# Patient Record
Sex: Female | Born: 1935 | Race: White | Hispanic: No | Smoking: Former smoker
Health system: Southern US, Community
[De-identification: ages and names within clinical notes are randomized; demographics above are authoritative.]

## PROBLEM LIST (undated history)

## (undated) DIAGNOSIS — E785 Hyperlipidemia, unspecified: Secondary | ICD-10-CM

## (undated) DIAGNOSIS — J45909 Unspecified asthma, uncomplicated: Secondary | ICD-10-CM

## (undated) DIAGNOSIS — I82409 Acute embolism and thrombosis of unspecified deep veins of unspecified lower extremity: Secondary | ICD-10-CM

## (undated) DIAGNOSIS — I10 Essential (primary) hypertension: Secondary | ICD-10-CM

## (undated) DIAGNOSIS — E119 Type 2 diabetes mellitus without complications: Secondary | ICD-10-CM

## (undated) DIAGNOSIS — C801 Malignant (primary) neoplasm, unspecified: Secondary | ICD-10-CM

## (undated) DIAGNOSIS — E0590 Thyrotoxicosis, unspecified without thyrotoxic crisis or storm: Secondary | ICD-10-CM

## (undated) DIAGNOSIS — K449 Diaphragmatic hernia without obstruction or gangrene: Secondary | ICD-10-CM

## (undated) HISTORY — PX: TOTAL HIP ARTHROPLASTY: SHX124

## (undated) HISTORY — PX: BREAST SURGERY: SHX581

## (undated) HISTORY — PX: ABDOMINAL HYSTERECTOMY: SHX81

---

## 2004-12-31 ENCOUNTER — Ambulatory Visit: Payer: Self-pay | Admitting: Internal Medicine

## 2005-03-17 ENCOUNTER — Inpatient Hospital Stay: Payer: Self-pay | Admitting: Internal Medicine

## 2006-01-03 ENCOUNTER — Ambulatory Visit: Payer: Self-pay | Admitting: Internal Medicine

## 2007-01-08 ENCOUNTER — Ambulatory Visit: Payer: Self-pay | Admitting: Internal Medicine

## 2007-04-13 ENCOUNTER — Ambulatory Visit: Payer: Self-pay | Admitting: Unknown Physician Specialty

## 2008-01-09 ENCOUNTER — Ambulatory Visit: Payer: Self-pay | Admitting: Internal Medicine

## 2008-12-24 ENCOUNTER — Ambulatory Visit: Payer: Self-pay | Admitting: Internal Medicine

## 2008-12-30 ENCOUNTER — Ambulatory Visit: Payer: Self-pay | Admitting: Internal Medicine

## 2009-01-27 ENCOUNTER — Ambulatory Visit: Payer: Self-pay | Admitting: Surgery

## 2010-03-04 ENCOUNTER — Ambulatory Visit: Payer: Self-pay | Admitting: Internal Medicine

## 2010-03-17 ENCOUNTER — Ambulatory Visit: Payer: Self-pay | Admitting: Internal Medicine

## 2010-09-07 ENCOUNTER — Ambulatory Visit: Payer: Self-pay | Admitting: Surgery

## 2010-10-04 ENCOUNTER — Ambulatory Visit: Payer: Self-pay | Admitting: Surgery

## 2010-10-08 ENCOUNTER — Ambulatory Visit: Payer: Self-pay | Admitting: Surgery

## 2010-10-19 LAB — PATHOLOGY REPORT

## 2010-10-22 ENCOUNTER — Ambulatory Visit: Payer: Self-pay | Admitting: Surgery

## 2010-10-27 LAB — PATHOLOGY REPORT

## 2010-11-10 ENCOUNTER — Ambulatory Visit: Payer: Self-pay | Admitting: Oncology

## 2010-11-10 ENCOUNTER — Ambulatory Visit: Payer: Self-pay | Admitting: Internal Medicine

## 2010-11-21 ENCOUNTER — Ambulatory Visit: Payer: Self-pay | Admitting: Internal Medicine

## 2010-11-21 ENCOUNTER — Ambulatory Visit: Payer: Self-pay | Admitting: Oncology

## 2010-12-22 ENCOUNTER — Ambulatory Visit: Payer: Self-pay | Admitting: Internal Medicine

## 2010-12-22 ENCOUNTER — Ambulatory Visit: Payer: Self-pay | Admitting: Oncology

## 2011-01-22 ENCOUNTER — Ambulatory Visit: Payer: Self-pay | Admitting: Oncology

## 2011-01-22 ENCOUNTER — Ambulatory Visit: Payer: Self-pay | Admitting: Internal Medicine

## 2011-02-21 ENCOUNTER — Ambulatory Visit: Payer: Self-pay | Admitting: Internal Medicine

## 2011-02-21 ENCOUNTER — Ambulatory Visit: Payer: Self-pay | Admitting: Oncology

## 2011-03-24 ENCOUNTER — Ambulatory Visit: Payer: Self-pay | Admitting: Oncology

## 2011-04-20 ENCOUNTER — Ambulatory Visit: Payer: Self-pay | Admitting: Internal Medicine

## 2011-05-13 ENCOUNTER — Ambulatory Visit: Payer: Self-pay | Admitting: Internal Medicine

## 2011-05-24 ENCOUNTER — Ambulatory Visit: Payer: Self-pay | Admitting: Internal Medicine

## 2011-07-25 ENCOUNTER — Ambulatory Visit: Payer: Self-pay | Admitting: Internal Medicine

## 2011-08-22 ENCOUNTER — Ambulatory Visit: Payer: Self-pay | Admitting: Internal Medicine

## 2011-11-18 ENCOUNTER — Ambulatory Visit: Payer: Self-pay | Admitting: Internal Medicine

## 2011-11-18 LAB — CREATININE, SERUM
Creatinine: 0.66 mg/dL (ref 0.60–1.30)
EGFR (African American): >60
EGFR (Non-African Amer.): >60

## 2011-11-18 LAB — CBC CANCER CENTER
Basophil #: 0 x10 3/mm (ref 0.0–0.1)
Eosinophil #: 0.1 x10 3/mm (ref 0.0–0.7)
Eosinophil %: 2.6 %
Lymphocyte #: 1.6 x10 3/mm (ref 1.0–3.6)
Lymphocyte %: 27.6 %
MCH: 27.3 pg (ref 26.0–34.0)
MCHC: 32.2 g/dL (ref 32.0–36.0)
Monocyte #: 0.5 x10 3/mm (ref 0.2–0.9)
Monocyte %: 7.7 %
Neutrophil #: 3.6 x10 3/mm (ref 1.4–6.5)
Neutrophil %: 61.8 %
Platelet: 172 x10 3/mm (ref 150–440)
RBC: 4.58 10*6/uL (ref 3.80–5.20)
WBC: 5.9 x10 3/mm (ref 3.6–11.0)

## 2011-11-18 LAB — HEPATIC FUNCTION PANEL A (ARMC)
Alkaline Phosphatase: 58 U/L (ref 50–136)
Bilirubin, Direct: 0.1 mg/dL (ref 0.00–0.20)
SGPT (ALT): 33 U/L
Total Protein: 7.9 g/dL (ref 6.4–8.2)

## 2011-11-21 ENCOUNTER — Ambulatory Visit: Payer: Self-pay | Admitting: Internal Medicine

## 2012-02-01 ENCOUNTER — Ambulatory Visit: Payer: Self-pay | Admitting: Radiation Oncology

## 2012-02-21 ENCOUNTER — Ambulatory Visit: Payer: Self-pay | Admitting: Radiation Oncology

## 2012-03-23 ENCOUNTER — Ambulatory Visit: Payer: Self-pay | Admitting: Radiation Oncology

## 2012-04-22 ENCOUNTER — Ambulatory Visit: Payer: Self-pay | Admitting: Radiation Oncology

## 2012-04-25 ENCOUNTER — Ambulatory Visit: Payer: Self-pay | Admitting: Internal Medicine

## 2012-05-25 ENCOUNTER — Ambulatory Visit: Payer: Self-pay | Admitting: Internal Medicine

## 2012-05-25 LAB — CBC CANCER CENTER
Basophil %: 0.3 %
Eosinophil #: 0.1 x10 3/mm (ref 0.0–0.7)
Lymphocyte %: 34 %
MCHC: 33.1 g/dL (ref 32.0–36.0)
MCV: 84 fL (ref 80–100)
Monocyte #: 0.6 x10 3/mm (ref 0.2–0.9)
Monocyte %: 8.2 %
Neutrophil #: 3.9 x10 3/mm (ref 1.4–6.5)
Neutrophil %: 55.4 %
Platelet: 179 x10 3/mm (ref 150–440)
RBC: 4.68 10*6/uL (ref 3.80–5.20)

## 2012-05-25 LAB — CREATININE, SERUM
Creatinine: 0.78 mg/dL (ref 0.60–1.30)
EGFR (Non-African Amer.): >60

## 2012-05-25 LAB — HEPATIC FUNCTION PANEL A (ARMC)
Albumin: 4.1 g/dL (ref 3.4–5.0)
Bilirubin, Direct: <0.05 mg/dL (ref 0.00–0.20)
Bilirubin,Total: 0.3 mg/dL (ref 0.2–1.0)
SGOT(AST): 23 U/L (ref 15–37)
SGPT (ALT): 35 U/L (ref 12–78)

## 2012-06-23 ENCOUNTER — Ambulatory Visit: Payer: Self-pay | Admitting: Internal Medicine

## 2012-08-01 ENCOUNTER — Ambulatory Visit: Payer: Self-pay | Admitting: Radiation Oncology

## 2012-08-21 ENCOUNTER — Ambulatory Visit: Payer: Self-pay | Admitting: Radiation Oncology

## 2012-10-24 ENCOUNTER — Ambulatory Visit: Payer: Self-pay | Admitting: Internal Medicine

## 2012-10-24 LAB — HEPATIC FUNCTION PANEL A (ARMC)
Bilirubin, Direct: 0.1 mg/dL (ref 0.00–0.20)
SGPT (ALT): 34 U/L (ref 12–78)
Total Protein: 7.6 g/dL (ref 6.4–8.2)

## 2012-10-24 LAB — CBC CANCER CENTER
Basophil #: 0 x10 3/mm (ref 0.0–0.1)
Basophil %: 0.3 %
Eosinophil #: 0.2 x10 3/mm (ref 0.0–0.7)
HCT: 37.3 % (ref 35.0–47.0)
HGB: 12.6 g/dL (ref 12.0–16.0)
Lymphocyte #: 2.1 x10 3/mm (ref 1.0–3.6)
MCH: 28.4 pg (ref 26.0–34.0)
MCHC: 33.9 g/dL (ref 32.0–36.0)
MCV: 84 fL (ref 80–100)
Monocyte #: 0.4 x10 3/mm (ref 0.2–0.9)
Monocyte %: 6.4 %
Neutrophil #: 4.2 x10 3/mm (ref 1.4–6.5)
Neutrophil %: 60.5 %
Platelet: 185 x10 3/mm (ref 150–440)
RBC: 4.46 10*6/uL (ref 3.80–5.20)

## 2012-10-24 LAB — CREATININE, SERUM
Creatinine: 0.69 mg/dL (ref 0.60–1.30)
EGFR (African American): >60
EGFR (Non-African Amer.): >60

## 2012-11-19 ENCOUNTER — Ambulatory Visit: Payer: Self-pay | Admitting: Internal Medicine

## 2012-11-20 ENCOUNTER — Ambulatory Visit: Payer: Self-pay | Admitting: Internal Medicine

## 2013-06-11 ENCOUNTER — Ambulatory Visit: Payer: Self-pay | Admitting: Internal Medicine

## 2013-12-26 ENCOUNTER — Emergency Department: Payer: Self-pay | Admitting: Emergency Medicine

## 2014-03-10 ENCOUNTER — Inpatient Hospital Stay: Payer: Self-pay | Admitting: Vascular Surgery

## 2014-03-10 LAB — BASIC METABOLIC PANEL
Anion Gap: 9 (ref 7–16)
BUN: 8 mg/dL (ref 7–18)
CO2: 28 mmol/L (ref 21–32)
CREATININE: 0.46 mg/dL — AB (ref 0.60–1.30)
Calcium, Total: 8.7 mg/dL (ref 8.5–10.1)
Chloride: 105 mmol/L (ref 98–107)
EGFR (Non-African Amer.): >60
Glucose: 88 mg/dL (ref 65–99)
Osmolality: 281 (ref 275–301)
Potassium: 3.4 mmol/L — ABNORMAL LOW (ref 3.5–5.1)
Sodium: 142 mmol/L (ref 136–145)

## 2014-03-10 LAB — CBC WITH DIFFERENTIAL/PLATELET
BASOS PCT: 0.3 %
Basophil #: 0 10*3/uL (ref 0.0–0.1)
Eosinophil #: 0.1 10*3/uL (ref 0.0–0.7)
Eosinophil %: 1.3 %
HCT: 37.2 % (ref 35.0–47.0)
HGB: 11.7 g/dL — AB (ref 12.0–16.0)
LYMPHS ABS: 2.1 10*3/uL (ref 1.0–3.6)
LYMPHS PCT: 23.5 %
MCH: 26 pg (ref 26.0–34.0)
MCHC: 31.5 g/dL — ABNORMAL LOW (ref 32.0–36.0)
MCV: 83 fL (ref 80–100)
MONO ABS: 0.6 x10 3/mm (ref 0.2–0.9)
Monocyte %: 6.9 %
NEUTROS ABS: 6 10*3/uL (ref 1.4–6.5)
Neutrophil %: 68 %
PLATELETS: 242 10*3/uL (ref 150–440)
RBC: 4.51 10*6/uL (ref 3.80–5.20)
RDW: 12.9 % (ref 11.5–14.5)
WBC: 8.8 10*3/uL (ref 3.6–11.0)

## 2014-03-11 LAB — URINALYSIS, COMPLETE
BACTERIA: NONE SEEN
BLOOD: NEGATIVE
Bilirubin,UR: NEGATIVE
GLUCOSE, UR: NEGATIVE mg/dL (ref 0–75)
KETONE: NEGATIVE
Nitrite: NEGATIVE
Ph: 5 (ref 4.5–8.0)
Protein: NEGATIVE
RBC,UR: 1 /HPF (ref 0–5)
Specific Gravity: 1.008 (ref 1.003–1.030)
Squamous Epithelial: 1
WBC UR: 18 /HPF (ref 0–5)

## 2014-03-12 LAB — CBC WITH DIFFERENTIAL/PLATELET
BASOS ABS: 0 10*3/uL (ref 0.0–0.1)
Basophil %: 0.3 %
EOS PCT: 1 %
Eosinophil #: 0.1 10*3/uL (ref 0.0–0.7)
HCT: 37.6 % (ref 35.0–47.0)
HGB: 11.8 g/dL — ABNORMAL LOW (ref 12.0–16.0)
Lymphocyte #: 1.2 10*3/uL (ref 1.0–3.6)
Lymphocyte %: 14.7 %
MCH: 25.8 pg — AB (ref 26.0–34.0)
MCHC: 31.4 g/dL — ABNORMAL LOW (ref 32.0–36.0)
MCV: 82 fL (ref 80–100)
MONOS PCT: 5.1 %
Monocyte #: 0.4 x10 3/mm (ref 0.2–0.9)
Neutrophil #: 6.6 10*3/uL — ABNORMAL HIGH (ref 1.4–6.5)
Neutrophil %: 78.9 %
Platelet: 233 10*3/uL (ref 150–440)
RBC: 4.56 10*6/uL (ref 3.80–5.20)
RDW: 12.9 % (ref 11.5–14.5)
WBC: 8.4 10*3/uL (ref 3.6–11.0)

## 2014-03-12 LAB — BASIC METABOLIC PANEL
ANION GAP: 11 (ref 7–16)
BUN: 6 mg/dL — ABNORMAL LOW (ref 7–18)
CALCIUM: 8.5 mg/dL (ref 8.5–10.1)
Chloride: 101 mmol/L (ref 98–107)
Co2: 30 mmol/L (ref 21–32)
Creatinine: 0.54 mg/dL — ABNORMAL LOW (ref 0.60–1.30)
GLUCOSE: 115 mg/dL — AB (ref 65–99)
Osmolality: 282 (ref 275–301)
POTASSIUM: 2.9 mmol/L — AB (ref 3.5–5.1)
Sodium: 142 mmol/L (ref 136–145)

## 2014-03-13 LAB — CBC WITH DIFFERENTIAL/PLATELET
BASOS ABS: 0 10*3/uL (ref 0.0–0.1)
Basophil %: 0.4 %
EOS ABS: 0.3 10*3/uL (ref 0.0–0.7)
Eosinophil %: 4.5 %
HCT: 39.2 % (ref 35.0–47.0)
HGB: 12.2 g/dL (ref 12.0–16.0)
Lymphocyte #: 1.9 10*3/uL (ref 1.0–3.6)
Lymphocyte %: 24 %
MCH: 25.8 pg — AB (ref 26.0–34.0)
MCHC: 31.1 g/dL — AB (ref 32.0–36.0)
MCV: 83 fL (ref 80–100)
MONO ABS: 0.6 x10 3/mm (ref 0.2–0.9)
Monocyte %: 7.4 %
NEUTROS PCT: 63.7 %
Neutrophil #: 5 10*3/uL (ref 1.4–6.5)
PLATELETS: 229 10*3/uL (ref 150–440)
RBC: 4.71 10*6/uL (ref 3.80–5.20)
RDW: 13 % (ref 11.5–14.5)
WBC: 7.8 10*3/uL (ref 3.6–11.0)

## 2014-03-13 LAB — BASIC METABOLIC PANEL
ANION GAP: 8 (ref 7–16)
BUN: 7 mg/dL (ref 7–18)
CHLORIDE: 101 mmol/L (ref 98–107)
CO2: 31 mmol/L (ref 21–32)
Calcium, Total: 8.9 mg/dL (ref 8.5–10.1)
Creatinine: 0.62 mg/dL (ref 0.60–1.30)
EGFR (African American): >60
EGFR (Non-African Amer.): >60
GLUCOSE: 100 mg/dL — AB (ref 65–99)
OSMOLALITY: 277 (ref 275–301)
Potassium: 3.3 mmol/L — ABNORMAL LOW (ref 3.5–5.1)
Sodium: 140 mmol/L (ref 136–145)

## 2014-03-14 LAB — CBC WITH DIFFERENTIAL/PLATELET
BASOS ABS: 0 10*3/uL (ref 0.0–0.1)
Basophil %: 0.2 %
EOS ABS: 0.6 10*3/uL (ref 0.0–0.7)
Eosinophil %: 6.6 %
HCT: 40 % (ref 35.0–47.0)
HGB: 13 g/dL (ref 12.0–16.0)
Lymphocyte #: 2 10*3/uL (ref 1.0–3.6)
Lymphocyte %: 24.4 %
MCH: 26.7 pg (ref 26.0–34.0)
MCHC: 32.5 g/dL (ref 32.0–36.0)
MCV: 82 fL (ref 80–100)
Monocyte #: 0.7 x10 3/mm (ref 0.2–0.9)
Monocyte %: 8.6 %
Neutrophil #: 5 10*3/uL (ref 1.4–6.5)
Neutrophil %: 60.2 %
PLATELETS: 231 10*3/uL (ref 150–440)
RBC: 4.87 10*6/uL (ref 3.80–5.20)
RDW: 13 % (ref 11.5–14.5)
WBC: 8.3 10*3/uL (ref 3.6–11.0)

## 2014-03-14 LAB — BASIC METABOLIC PANEL
Anion Gap: 8 (ref 7–16)
BUN: 11 mg/dL (ref 7–18)
Calcium, Total: 9.2 mg/dL (ref 8.5–10.1)
Chloride: 102 mmol/L (ref 98–107)
Co2: 32 mmol/L (ref 21–32)
Creatinine: 0.68 mg/dL (ref 0.60–1.30)
EGFR (African American): >60
EGFR (Non-African Amer.): >60
Glucose: 108 mg/dL — ABNORMAL HIGH (ref 65–99)
OSMOLALITY: 283 (ref 275–301)
Potassium: 3.9 mmol/L (ref 3.5–5.1)
Sodium: 142 mmol/L (ref 136–145)

## 2014-03-16 LAB — WOUND CULTURE

## 2014-03-17 LAB — WOUND CULTURE

## 2014-06-11 ENCOUNTER — Ambulatory Visit: Payer: Self-pay | Admitting: Orthopedic Surgery

## 2014-06-11 DIAGNOSIS — R030 Elevated blood-pressure reading, without diagnosis of hypertension: Secondary | ICD-10-CM

## 2014-06-11 LAB — CBC
HCT: 39 % (ref 35.0–47.0)
HGB: 12.4 g/dL (ref 12.0–16.0)
MCH: 26.3 pg (ref 26.0–34.0)
MCHC: 31.7 g/dL — ABNORMAL LOW (ref 32.0–36.0)
MCV: 83 fL (ref 80–100)
PLATELETS: 198 10*3/uL (ref 150–440)
RBC: 4.7 10*6/uL (ref 3.80–5.20)
RDW: 14.4 % (ref 11.5–14.5)
WBC: 7.5 10*3/uL (ref 3.6–11.0)

## 2014-06-11 LAB — BASIC METABOLIC PANEL
Anion Gap: 6 — ABNORMAL LOW (ref 7–16)
BUN: 11 mg/dL (ref 7–18)
CALCIUM: 9.3 mg/dL (ref 8.5–10.1)
CREATININE: 0.47 mg/dL — AB (ref 0.60–1.30)
Chloride: 104 mmol/L (ref 98–107)
Co2: 32 mmol/L (ref 21–32)
EGFR (Non-African Amer.): >60
GLUCOSE: 79 mg/dL (ref 65–99)
Osmolality: 281 (ref 275–301)
Potassium: 3.5 mmol/L (ref 3.5–5.1)
Sodium: 142 mmol/L (ref 136–145)

## 2014-06-11 LAB — URINALYSIS, COMPLETE
BILIRUBIN, UR: NEGATIVE
Bacteria: NONE SEEN
Blood: NEGATIVE
Glucose,UR: NEGATIVE mg/dL (ref 0–75)
Ketone: NEGATIVE
LEUKOCYTE ESTERASE: NEGATIVE
Nitrite: NEGATIVE
PH: 7 (ref 4.5–8.0)
PROTEIN: NEGATIVE
RBC, UR: NONE SEEN /HPF (ref 0–5)
Specific Gravity: 1.015 (ref 1.003–1.030)
WBC UR: 1 /HPF (ref 0–5)

## 2014-06-11 LAB — MRSA PCR SCREENING

## 2014-06-11 LAB — SEDIMENTATION RATE: Erythrocyte Sed Rate: 14 mm/hr (ref 0–30)

## 2014-06-11 LAB — PROTIME-INR
INR: 1.1
Prothrombin Time: 13.9 secs (ref 11.5–14.7)

## 2014-06-11 LAB — APTT: ACTIVATED PTT: 31.6 s (ref 23.6–35.9)

## 2014-06-12 LAB — URINE CULTURE

## 2014-06-17 ENCOUNTER — Inpatient Hospital Stay: Payer: Self-pay | Admitting: Orthopedic Surgery

## 2014-06-18 LAB — BASIC METABOLIC PANEL
Anion Gap: 6 — ABNORMAL LOW (ref 7–16)
BUN: 9 mg/dL (ref 7–18)
CREATININE: 0.58 mg/dL — AB (ref 0.60–1.30)
Calcium, Total: 8.1 mg/dL — ABNORMAL LOW (ref 8.5–10.1)
Chloride: 105 mmol/L (ref 98–107)
Co2: 31 mmol/L (ref 21–32)
GLUCOSE: 111 mg/dL — AB (ref 65–99)
Osmolality: 283 (ref 275–301)
POTASSIUM: 3.2 mmol/L — AB (ref 3.5–5.1)
SODIUM: 142 mmol/L (ref 136–145)

## 2014-06-18 LAB — HEMOGLOBIN: HGB: 7.7 g/dL — ABNORMAL LOW (ref 12.0–16.0)

## 2014-06-18 LAB — PLATELET COUNT: PLATELETS: 153 10*3/uL (ref 150–440)

## 2014-06-19 LAB — HEMOGLOBIN
HGB: 8 g/dL — AB (ref 12.0–16.0)
HGB: 7.9 g/dL — ABNORMAL LOW (ref 12.0–16.0)

## 2014-06-19 LAB — BASIC METABOLIC PANEL
Anion Gap: 7 (ref 7–16)
BUN: 8 mg/dL (ref 7–18)
Calcium, Total: 8.1 mg/dL — ABNORMAL LOW (ref 8.5–10.1)
Chloride: 104 mmol/L (ref 98–107)
Co2: 30 mmol/L (ref 21–32)
Creatinine: 0.48 mg/dL — ABNORMAL LOW (ref 0.60–1.30)
EGFR (African American): >60
Glucose: 92 mg/dL (ref 65–99)
Osmolality: 279 (ref 275–301)
POTASSIUM: 3.5 mmol/L (ref 3.5–5.1)
Sodium: 141 mmol/L (ref 136–145)

## 2014-06-20 ENCOUNTER — Encounter: Payer: Self-pay | Admitting: Internal Medicine

## 2014-06-20 LAB — HEMOGLOBIN: HGB: 8.1 g/dL — ABNORMAL LOW (ref 12.0–16.0)

## 2014-06-23 ENCOUNTER — Encounter: Payer: Self-pay | Admitting: Internal Medicine

## 2014-07-22 ENCOUNTER — Encounter
Admit: 2014-07-22 | Disposition: A | Discharge status: Home / Self Care | Payer: Self-pay | Attending: Internal Medicine | Admitting: Internal Medicine

## 2014-08-22 ENCOUNTER — Encounter
Admit: 2014-08-22 | Disposition: A | Discharge status: Home / Self Care | Payer: Self-pay | Attending: Internal Medicine | Admitting: Internal Medicine

## 2014-09-13 NOTE — Op Note (Signed)
PATIENT NAME:  Lindsey English, Lindsey English MR#:  [id number] DATE OF BIRTH:  [birth year]  DATE OF PROCEDURE:  03/11/2014  PREOPERATIVE DIAGNOSES:  1.  Ulceration of the left lower extremity associated with cellulitis.  2.  Venous insufficiency left lower extremity.  3.  Lymphedema left lower extremity.   POSTOPERATIVE DIAGNOSES:  1.  Ulceration of the left lower extremity associated with cellulitis.  2.  Venous insufficiency left lower extremity.  3.  Lymphedema left lower extremity.   PROCEDURE PERFORMED:  Excisional debridement of left lower extremity ulceration.   PROCEDURE PERFORMED BY:  Katha Cabal, MD.   ANESTHESIA: MAC.   SPECIMEN: Wound culture of purulent material.   INDICATIONS:  Ms. Spirito is an 81 year old woman who presented to the office with worsening of ulceration and severe cellulitis. She subsequently was admitted, initiated on IV antibiotics and is subsequently undergoing debridement.   DESCRIPTION OF PROCEDURE: The patient is taken to the operating room and placed in the supine position. After adequate anesthesia is obtained the left leg is prepped and draped in a sterile fashion. Appropriate timeout is called.   Skin and subcutaneous tissues are then excised using a 10-blade scalpel. Muscle and tendon are not exposed. This is debridement using sharp excision of skin and subcutaneous tissues with a knife.  Bleeding is controlled with pressure. An area of pus was encountered and this was cultured with a wound culture swab. The wound is then irrigated with 1 liter of saline and measured. It measures 7 cm in its longest dimension, x 5 cm in its widest dimension. It is subsequently covered with Xeroform gauze, padded with 4 x 4s and wrapped with Kerlix.   The patient tolerated the procedure well. There were no immediate complications. She was taken to the recovery area in excellent condition.    ____________________________ Katha Cabal, MD ggs:lt D: 03/12/2014 09:29:59  ET T: 03/12/2014 15:50:22 ET JOB#: [id number]  cc: Katha Cabal, MD, <Dictator> Katha Cabal MD ELECTRONICALLY SIGNED 03/26/2014 11:45

## 2014-09-13 NOTE — Discharge Summary (Signed)
PATIENT NAME:  Lindsey English, Lindsey English MR#:  [id number] DATE OF BIRTH:  05-10-1930  DATE OF ADMISSION:  03/10/2014 DATE OF DISCHARGE:  03/14/2014  ADMITTING DIAGNOSIS: Cellulitis with ulceration of the left leg.   SECONDARY DIAGNOSES: Hypertension, type 2 diabetes, osteoporosis, osteoarthritis bilateral hips, hyperlipidemia, chronic lymphedema.  OPERATION PERFORMED: Debridement of left ulcer, 03/11/2014.   CONSULTATIONS: Development worker, community for medical management.   HISTORY OF PRESENT ILLNESS: The patient presented to my office for worsening pain and increasing drainage with worsening of her ulceration of the left leg. She was admitted for IV antibiotics, as well as debridement.   HOSPITAL COURSE: On the day of admission, she underwent evaluation by medicine. She was initiated on antibiotic therapy. On hospital day number 2, she underwent debridement without complication. She was subsequently followed by both myself and medicine, and felt fit for discharge on postoperative day number 3.   She is discharged to the facility from which she had come. She is to follow up with me in 1 to 2 weeks. She is to follow up with Dr. Gilford Rile in 1 week. She will continue Silvadene cream to her wound.   She is discharged on Cipro. Cultures from her debridement grew Acinetobacter sensitive to quinolones, as well as Proteus sensitive to quinolones.    ____________________________ Katha Cabal, MD ggs:JT D: 04/02/2014 07:49:44 ET T: 04/02/2014 09:17:37 ET JOB#: [id number]  cc: Katha Cabal, MD, <Dictator> Katha Cabal MD ELECTRONICALLY SIGNED 04/26/2014 16:37

## 2014-09-13 NOTE — Consult Note (Signed)
PATIENT NAME:  Lindsey English, TELLO MR#:  [id number] DATE OF BIRTH:  1930-01-13  DATE OF CONSULTATION:  03/10/2014  PRIMARY CARE PHYSICIAN:  John B. Sarina Ser, MD  REFERRING PHYSICIAN:  Katha Cabal, MD CONSULTING PHYSICIAN:  Chivonne Rascon A. Posey Pronto, MD  REASON FOR CONSULTATION: Medical management.   HISTORY OF PRESENT ILLNESS: Ms. Lindsey English is a pleasant 81 year old Caucasian female with multiple medical problems including hypertension, diabetes, hyperlipidemia, history of breast cancer, osteoporosis, history of DVT, is admitted on the surgical floor due to a nonhealing left leg ulcer. The patient has history of chronic edema in her lower extremity with history of poor circulation, likely underlying PVD. Was getting compression treatments at home to reduce the edema; however, her left leg ulcer continued to not heal well and is now having significant foul-smelling discharge. She is being admitted on the surgical service, internal medicine was consulted for medical management.   The patient denies any fever. She reports having a lot of nausea due to the foul odor from the ulcer and is not able to each and has been losing appetite and has lost weight, several pounds in last 2 months.   PAST MEDICAL HISTORY: 1.  Hypertension.  2.  Diabetes.  3.  Hyperlipidemia.  4.  Asthma.  5.  Hiatal hernia.  6.  Osteoporosis.  7.  Umbilical hernia.  8.  History of DVT.  9.  Excision of right breast mass 2010, benign palpation.  10.  Stage I, grade 2 invasive ductal carcinoma of the right breast, status post excisional biopsy 10/08/2010.  11.  History of hysterectomy in 1960.  FAMILY HISTORY: Positive for hypertension.   SOCIAL HISTORY: Denies smoking or alcohol usage, remains physically active. Remote smoking in the past. No alcohol or smoking at present.    HOME MEDICATIONS: 1.  Albuterol sulfate 2 puffs every 4 hours as needed.  2.  Alendronate 70 mg once a week.  3.  Amlodipine 5 mg daily.  4.  Bayer  aspirin 81 mg daily.  5.  Caltrate with vitamin D b.i.d.  6.  Centrum Silver p.o. daily.  7.  Exemestane 25 mg 1 p.o. daily.  8.  Lasix 40 mg daily.  9.  K-Dur 10 mEq 2 capsules 3 times a day.  10.  Simvastatin 10 mg at bedtime.  11.  Spiriva 18 mcg inhalation daily.  12.  Tylenol Arthritis 2 tablets 2 times a day.  13.  Zyrtec 10 mg daily at bedtime.   REVIEW OF SYSTEMS:  CONSTITUTIONAL: No fever. Positive for fatigue, weakness, nausea, and weight loss.  EYES: No blurred or double vision, glaucoma.  ENT: No tinnitus, ear pain, hearing loss.  RESPIRATORY: No cough, wheeze, shortness of breath, or hemoptysis.  CARDIOVASCULAR: No chest pain, orthopnea, or arrhythmia. Positive for leg edema.  GASTROINTESTINAL: Positive for nausea. No vomiting, abdominal pain, GERD, or melena.  GENITOURINARY: No dysuria, hematuria, frequency.  ENDOCRINE: No polyuria, nocturia, or thyroid problems.  HEMATOLOGY: No anemia or easy bruising.  SKIN: The patient has chronic nonhealing ulcer on the left lower extremity with foul-smelling odor. No acne or rash.  MUSCULOSKELETAL: Positive for arthritis, bilateral hip pain. NEUROLOGIC:  No CVA, TIA, or dementia or dysarthria.  PSYCHIATRIC:  No anxiety, depression, or bipolar disorder.   PHYSICAL EXAMINATION: GENERAL: The patient is awake, alert, oriented x 3, not in acute distress.  VITAL SIGNS: Afebrile. Pulse is 87. Blood pressure 191/81, saturations are 95% on room air.  HEENT: Atraumatic, normocephalic. PERRLA, EOM intact.  Oral mucosa is moist.   NECK: Supple. No JVD. No carotid bruit.  LUNGS: Clear to auscultation bilaterally. No rales, rhonchi, respiratory distress, or labored breathing.  CARDIOVASCULAR:  Both the heart sounds are normal. Rate, rhythm regular. PMI not lateralized. Chest nontender.  EXTREMITIES: Good pedal pulses on the right. Good femoral pulses bilaterally. Left lower extremity is covered with dressing. There is a foul-smelling odor. The  patient has significant large size ulceration over the left calf area. She has chronic venous stasis changes and 2+ pitting edema.  NEUROLOGIC: Grossly intact cranial nerves II through XII. No motor or sensory deficit.  PSYCHIATRIC: The patient is awake, alert, oriented x 3.   LABORATORY DATA: CBC and metabolic panel still pending.   ASSESSMENT: An 81 year old Lindsey English with history of breast cancer, migraine headaches, osteoarthritis, hypertension, and diabetes admitted on the surgical service for nonhealing left leg ulcer. Internal medicine was consulted for medical management.  1.  Hypertension. Will resume the patient's amlodipine on a daily basis. Give p.r.n. hydralazine as needed. She has another new medication that was started by Dr. Gilford Rile as outpatient and family to bring in the medication, which can be resumed.  2.  Type 2 diabetes, diet controlled. Will do sliding scale insulin. In the setting of infection we want to make sure blood sugars are tightly controlled.  3.  Osteoporosis.  4.  Bilateral hip pain, with osteoarthritis. Continue p.r.n. Tylenol Arthritis.  5.  Hyperlipidemia, on simvastatin.  6.  Chronic bilateral leg edema with nonhealing left calf ulcer. Dr. Delana Meyer is planning to take the patient to operating room for debridement and wound cleaning. Continue IV antibiotics as you are.  7.  Deep vein thrombosis prophylaxis. Place patient on heparin subcutaneous t.i.d. for now.   Thank you for the consult. We will follow while patient is in the hospital.   TIME SPENT: 45 minutes.    ____________________________ Hart Rochester Posey Pronto, MD sap:LT D: 03/10/2014 18:28:55 ET T: 03/10/2014 19:13:09 ET JOB#: [id number]  cc: Daisha Filosa A. Posey Pronto, MD, <Dictator> John B. Sarina Ser, MD Ilda Basset MD ELECTRONICALLY SIGNED 04/22/2014 9:12

## 2014-09-15 LAB — SURGICAL PATHOLOGY

## 2014-09-21 NOTE — Op Note (Signed)
PATIENT NAME:  Lindsey English, PHO MR#:  [id number] DATE OF BIRTH:  [birth year]  DATE OF PROCEDURE:  06/17/2014  PREOPERATIVE DIAGNOSIS:  Left hip osteoarthritis.   POSTOPERATIVE DIAGNOSIS:  Left hip osteoarthritis.   PROCEDURE:  Left total hip replacement.   ANESTHESIA:  Spiral.   SURGEON:  Laurene Footman, MD   DESCRIPTION OF PROCEDURE:  The patient was brought to the operating room, and after adequate anesthesia was obtained, the patient was placed on the operative table with the right leg on a well-padded table and the left leg in the Medacta attachment. After checking C-arm to make sure good visualization of the hip could be obtained, the hip was prepped and draped in the usual sterile fashion with appropriate patient identification and timeout procedure completed. An oblique incision was made over the hip, centered over the greater trochanter and tensor fasciae latae muscle. The tensor fasciae muscle was identified and the fascia incised. The muscle belly was retracted laterally and deep retractors placed. The deep fascia was incised and the lateral femoral circumflex vessels cauterized. The anterior capsule was exposed and an anterior capsulotomy was created with lateral portion tagged for retractor placement. The femoral neck was then exposed and a femoral neck osteotomy carried out with the head removed without difficulty. The head had severe sclerotic changes with complete loss of articular cartilage, and the acetabulum also was remarkable for sclerotic bone and some bone loss. The labrum was excised, and sequential reaming was carried out to 54 mm, at which point there was good bleeding bone. The Mpact trial was placed, and a 56 Mpact cup was impacted into place with a 36 mm liner. The leg was then externally rotated and the pubofemoral and ischiofemoral releases carried out to allow for extension of the leg. The canal was opened, and sequential broaching was carried out to a size 4. With trialing,  the lateralized stem gave better reproduction of baseline anatomy. The 4 lateralized AMIS stem was impacted down the canal. With trials, an S head gave the best leg length. The S 36 mm head was impacted. The hip was reduced and was stable. The wound was thoroughly irrigated. The periarticular tissue was infiltrated with 30 mL of 0.25% Sensorcaine with epinephrine. The wound was closed using a heavy Quill suture for the tensor, subcutaneous drain, followed by 2-0 Quill subcutaneously. Skin staples, Xeroform, 4 x 4's, ABD, and tape were applied. The patient was sent to the recovery room in stable condition.   ESTIMATED BLOOD LOSS:  500.   COMPLICATIONS:  None.   SPECIMEN:  Removed femoral head.   IMPLANTS:  Medacta 4 lateralized AMIS stem, 56 mm Mpact cup with 36 mm liner and a 36 mm S head.    ____________________________ Laurene Footman, MD mjm:nb D: 06/17/2014 21:21:32 ET T: 06/17/2014 23:31:09 ET JOB#: [id number]  cc: Laurene Footman, MD, <Dictator> [facility] MD ELECTRONICALLY SIGNED 06/18/2014 0:19

## 2014-09-21 NOTE — Discharge Summary (Signed)
PATIENT NAME:  Lindsey English, Lindsey English MR#:  [id number] DATE OF BIRTH:  1930/05/22  DATE OF ADMISSION:  06/17/2014 DATE OF DISCHARGE:  06/20/2014  ADMITTING DIAGNOSIS: Left hip osteoarthritis.   DISCHARGE DIAGNOSIS: Left hip osteoarthritis.   PROCEDURE PERFORMED: Left total hip replacement.   ANESTHESIA: Spinal.   SURGEON: Laurene Footman, M.D.   ESTIMATED BLOOD LOSS: 500 mL.   COMPLICATIONS: None.   SPECIMEN REMOVED: Femoral head.   IMPLANTS: Medacta 4 lateralized AMIS stem, 56 mm Mpact cup with 36 mm liner and a 36 mm S head.  HISTORY: The patient has been unable to manage shoes or socks. She is unable to sit for one-half hour. She is unable to rise from a chair. She can manage stairs by other methods. She is unable to pick up an object from the floor. She uses 2 crutches, canes or walker. She cannot carry. She is not able to drive a car. She uses a wheelchair outside the house. She has had left lower leg swelling and infection in the leg this past fall, now infection is resolved but still has swelling.   PHYSICAL EXAMINATION: GENERAL: Normal. EYES: Normal. LYMPHATIC: No adenopathy.  CARDIOVASCULAR: Regular rate and rhythm.  RESPIRATORY: Clear and nonlabored.  HEENT: Upper and lower dentures.  VASCULAR: No edema, swelling or tenderness. NEURO PSYCH: Normal.  MUSCULOSKELETAL: Normal.  LEFT LOWER EXTREMITY: The patient is unable to walk. She presents in a wheelchair. She has no tenderness throughout the lumbar spine. She has normal hip flexion as well as adduction as well as quadriceps and dorsiflexion and plantar flexion strength. Reflexes are 2/4 at the patella and Achilles. She has no swelling, warmth, or erythema throughout the left hip. She is tender to palpation throughout the anterior groin. Hip flexion is 70 degrees, hip extension +20 degrees, abduction 5 degrees, adduction 2 degrees, internal rotation 0 degrees, external rotation 10 degrees. She has normal knee range of motion with  no swelling, warmth, or erythema. She is neurovascularly intact to the left lower extremity.   HOSPITAL COURSE: The patient was admitted to the hospital on June 17, 2014. She had surgery that same day and was brought to the orthopedic floor from the PACU in stable condition. On postop day 1, the patient had an acute postop blood loss anemia with a hemoglobin of 7.7. She was given 1 unit of packed red blood cells and by postop day 2 hemoglobin was up to 8. On postop day 1, the patient also was hypokalemic with a potassium of 3.2. She was given potassium supplementation and by postop day 3 potassium was up to 3.5. The patient was doing well by postop day 3. Vital signs and labs are stable. She was making slow progress with physical therapy and she was stable and ready for discharge to rehab facility for continuation of rehab.   CONDITION AT DISCHARGE: Stable.   DISCHARGE INSTRUCTIONS: The patient may gradually increase weight-bearing on the effected extremity. Thigh-high TED hose on both legs and remove 1 hour per 8 hour shift. Elevate the heels off the bed. Incentive spirometer every hour while awake. Cough and deep breathing. The patient will resume a regular diet as tolerated. Apply an ice pack to the effected area. Do not get the dressing or bandage wet or dirty. Call [facility] orthopedics if the dressing gets water under it. Leave the dressing on. Call [facility] orthopedics if the dressing gets any bright red bleeding from the incision wound, fever above 101.5 degrees, redness, swelling,  or drainage at the incision site. Call [facility] orthopedics if you experience any increased leg pain, numbness or weakness in your legs or bowel or bladder symptoms. Call [facility] orthopedics for a follow-up appointment in 2 weeks.   DISCHARGE MEDICATIONS: Please see list of discharge medications on discharge instructions.  ____________________________ T. Rachelle Hora,  PA-C tcg:sb D: 06/20/2014 08:02:10 ET T: 06/20/2014 08:19:28 ET JOB#: [id number]  cc: T. Rachelle Hora, PA-C, <Dictator> Duanne Guess [place] ELECTRONICALLY SIGNED 06/23/2014 8:41

## 2016-01-04 ENCOUNTER — Ambulatory Visit
Admission: RE | Admit: 2016-01-04 | Discharge: 2016-01-04 | Disposition: A | Discharge status: Home / Self Care | Payer: Medicare Other | Source: Ambulatory Visit | Attending: Physician Assistant | Admitting: Physician Assistant

## 2016-01-04 ENCOUNTER — Other Ambulatory Visit: Payer: Self-pay | Admitting: Physician Assistant

## 2016-01-04 DIAGNOSIS — M7989 Other specified soft tissue disorders: Secondary | ICD-10-CM | POA: Diagnosis not present

## 2016-02-18 ENCOUNTER — Ambulatory Visit: Payer: Medicare Other | Attending: Internal Medicine | Admitting: Occupational Therapy

## 2016-02-18 ENCOUNTER — Encounter: Payer: Self-pay | Admitting: Occupational Therapy

## 2016-02-18 ENCOUNTER — Ambulatory Visit: Payer: Medicare Other

## 2016-02-18 DIAGNOSIS — I890 Lymphedema, not elsewhere classified: Secondary | ICD-10-CM | POA: Insufficient documentation

## 2016-02-18 NOTE — Patient Instructions (Signed)
Before commencing OT for LE care attend Wound Ctr consult to address forming R distal leg ulcer and suspected cellulitis. This must be closed before commencing LE compression wrapping.   Before commencing OT for LE care, attend podiatry consult for diabetic nail care.     Lymphedema Precautions  If you experience atypical shortness of breath, or notice any signs /symptoms of skin infection (aka cellulitis) remove all compression wraps/ garments, discontinue manual lymphatic drainage (MLD), and report symptoms to your physician immediately. Discontinue MLD and compression for 72 hours after you take your first oral antibiotic so not to spread the infection.   Lymphedema Self- Care Instructions  1. EXERCISE: Perform lymphatic pumping there ex 2 x a day. While wearing your compression wraps or garments. Perform 10 reps of each exercise bilaterally and be sure to perform them in order. Don;t skip around!  OMIT PARTIAL SIT UP  2. MLD: Perform simple self-Manual Lymphatic Drainage (MLD) at least once a day as directed.  3. WRAPS: Compression wraps are to be worn 23 hrs/ 7 days/wk during Intensive Phase of Complete Decongestive Therapy (CDT).Building tolerance may take time and practice, so don't get discouraged. If bandages begin to feel tight during periods of inactivity and/or during the night, try performing your exercises to loosen them.   4. GARMENTS: During Management Phase CDT your compression garments are to be worn during waking hours when active. Do NOT sleep in your garments!!   5. PUT YOUR FEET UP! Elevate your feet and legs and feet to the level of your heart whenever you are sitting down.   6. SKIN: Carefully monitor skin condition and perform impeccable hygiene daily. Bathe skin with mild soap and water and apply low pH lotion (aka Eucerin ) to improve hydration and limit infection risk.    Lymphatic Pumping Exercises:

## 2016-02-22 NOTE — Therapy (Signed)
[place] [facility] [address] [facility] [address] [place], [place], [address] Phone: [phone]   Fax:  [phone]  Occupational Therapy Evaluation  Patient Details  Name: Lindsey English MRN: [id number] Date of Birth: 1930-04-13 No Data Recorded  Encounter Date: 02/18/2016    History reviewed. No pertinent past medical history.  History reviewed. No pertinent surgical history.  There were no vitals filed for this visit.           LYMPHEDEMA/ONCOLOGY QUESTIONNAIRE - 02/22/16 1654      Type   Cancer Type --  R breast- N/A to BLE edema episode     What other symptoms do you have   Are you Having Heaviness or Tightness Yes   Are you having pitting edema Yes   Is it Hard or Difficult finding clothes that fit Yes   Do you have infections No   Is there Decreased scar mobility Yes   Stemmer Sign Yes   Other Symptoms intractible pain     Lymphedema Stage   Stage STAGE 1 SPONTANEOUSLY REVERSIBLE     Lymphedema Assessments   Lymphedema Assessments Lower extremities     Right Lower Extremity Lymphedema   Other BLE limb volumetrics and limb volume differential to be assessed at first treatment visit.                OT Treatments/Exercises (OP) - 02/22/16 0001      ADLs   Overall ADLs LLE pain and BLE swelling limits bathing, dressing, functional mobility, and all instrumental ADLs                    OT Long Term Goals - 02/22/16 1714      OT LONG TERM GOAL #1   Title Lymphedema (LE) management/ self-care: Pt able to apply multi layered, gradient compression wraps with maximum caregiver assistance using proper techniques within 2 weeks to achieve optimal limb volume reduction.   Baseline dependent   Time 2   Period Weeks   Status New     OT LONG TERM GOAL #2   Title Lymphedema (LE) management/ self-care:  Pt to achieve at least 10% LLE limb volume reductions bilaterally during Intensive CDT to limit LE  progression, decrease infection and falls risk, to reduce pain/, and to improve safe ambulation and functional mobility.   Baseline dependent   Time 12   Period Weeks   Status New     OT LONG TERM GOAL #3   Title Lymphedema (LE) management/ self-care:  Pt to tolerate clinical MLD, skin care, and daily compression wraps, garments and devices in keeping w/ prescribed wear regime within 1 week of issue date to progress and retain clinical and functional gains and to limit LE progression.   Baseline dependent   Time 12   Period Weeks   Status New     OT LONG TERM GOAL #4   Title Lymphedema (LE) management/ self-care:  Pt >/= 85 % compliant with all daily, LE self-care protocols for home program w/ needed level of caregiver assistance , including simple self-manual lymphatic drainage (MLD), skin care, lymphatic pumping the ex, skin care, and donning/ doffing compression wraps and garments o limit LE progression and further functional decline.     Baseline dependent   Time 12   Period Weeks   Status New     OT LONG TERM GOAL #5   Title Lymphedema (LE) self-care:  During Management Phase CDT Pt to sustain  limb volume reductions achieved during Intensive Phase CDT within 5% utilizing LE self-care protocols, appropriate compression garments/ devices, and needed level of caregiver assistance.   Baseline dependent   Time 6   Period Months   Status New               Plan - 02/22/16 1658    Clinical Impression Statement Pt is a pleasant, 82 yo lady presenting w/ moderate LLE and mild RLE lymphedema (LE) 2/2 suspected venous insufficiency and dependent positioning.  Lymphedema, a chronic, progressive condition w/ no cure, and current level of inractible LLE pain limits Pt's ability to walk, to complete transfers and bed mobility, to basic and instrumental ADLs, and to perform social roles and to participate in community and family activities. Without LE treatment, known as Complete  Decongestive Therapy (CDT,  Pt's skin condition is likely to worsen , the condition will progress, and further functional decline is expected. Despite appropriateness for LE treatment current complications limiting tretament prognosis and tolerance at present include 10/10 L leg pain and weeping, LLE leg ucer ~ 6cm  x 6 cm. Pt will benefit from improved pain control and wound care to limit infection risk  before commencing OT for treatment . OT discussed on the telephone with triage nurse for referring physician.  Shwe later called me back to let me know referring physician will schedule a visit w/ Pt ASAP to assess her condition and refer back to me when appropriate.    Rehab Potential Good   Clinical Impairments Affecting Rehab Potential intractible LLE pain 10/10; open weeping LLE wound      Patient will benefit from skilled therapeutic intervention in order to improve the following deficits and impairments:  Abnormal gait, Decreased endurance, Decreased skin integrity, Decreased knowledge of precautions, Improper body mechanics, Decreased activity tolerance, Decreased knowledge of use of DME, Impaired flexibility, Decreased mobility, Difficulty walking, Impaired sensation, Decreased range of motion, Increased edema, Pain, Decreased cognition  Visit Diagnosis: Lymphedema, not elsewhere classified - Plan: Ot plan of care cert/re-cert    Problem List There are no active problems to display for this patient.   Andrey Spearman, MS, OTR/L, [facility] 02/22/16 5:27 PM  [place] MAIN [facility] [address] [place], [place], [address] Phone: [phone]   Fax:  [phone]  Name: Lindsey English MRN: [id number] Date of Birth: 03-Dec-1929

## 2016-02-29 ENCOUNTER — Inpatient Hospital Stay
Admission: EM | Admit: 2016-02-29 | Discharge: 2016-03-02 | DRG: 603 | Disposition: A | Discharge status: Home Health | Payer: Medicare Other | Attending: Internal Medicine | Admitting: Internal Medicine

## 2016-02-29 ENCOUNTER — Emergency Department: Payer: Medicare Other

## 2016-02-29 ENCOUNTER — Encounter: Payer: Medicare Other | Admitting: Nurse Practitioner

## 2016-02-29 ENCOUNTER — Encounter: Payer: Self-pay | Admitting: Intensive Care

## 2016-02-29 DIAGNOSIS — Z7983 Long term (current) use of bisphosphonates: Secondary | ICD-10-CM | POA: Diagnosis not present

## 2016-02-29 DIAGNOSIS — Z79811 Long term (current) use of aromatase inhibitors: Secondary | ICD-10-CM

## 2016-02-29 DIAGNOSIS — E119 Type 2 diabetes mellitus without complications: Secondary | ICD-10-CM | POA: Diagnosis present

## 2016-02-29 DIAGNOSIS — E785 Hyperlipidemia, unspecified: Secondary | ICD-10-CM | POA: Diagnosis present

## 2016-02-29 DIAGNOSIS — Z79899 Other long term (current) drug therapy: Secondary | ICD-10-CM | POA: Diagnosis not present

## 2016-02-29 DIAGNOSIS — L0390 Cellulitis, unspecified: Secondary | ICD-10-CM | POA: Diagnosis present

## 2016-02-29 DIAGNOSIS — I10 Essential (primary) hypertension: Secondary | ICD-10-CM | POA: Diagnosis present

## 2016-02-29 DIAGNOSIS — Z8249 Family history of ischemic heart disease and other diseases of the circulatory system: Secondary | ICD-10-CM

## 2016-02-29 DIAGNOSIS — Z96642 Presence of left artificial hip joint: Secondary | ICD-10-CM | POA: Diagnosis present

## 2016-02-29 DIAGNOSIS — J45909 Unspecified asthma, uncomplicated: Secondary | ICD-10-CM | POA: Diagnosis present

## 2016-02-29 DIAGNOSIS — I872 Venous insufficiency (chronic) (peripheral): Secondary | ICD-10-CM | POA: Diagnosis present

## 2016-02-29 DIAGNOSIS — R0602 Shortness of breath: Secondary | ICD-10-CM | POA: Diagnosis present

## 2016-02-29 DIAGNOSIS — Z87891 Personal history of nicotine dependence: Secondary | ICD-10-CM

## 2016-02-29 DIAGNOSIS — L03115 Cellulitis of right lower limb: Principal | ICD-10-CM | POA: Diagnosis present

## 2016-02-29 DIAGNOSIS — R262 Difficulty in walking, not elsewhere classified: Secondary | ICD-10-CM

## 2016-02-29 DIAGNOSIS — L270 Generalized skin eruption due to drugs and medicaments taken internally: Secondary | ICD-10-CM | POA: Diagnosis present

## 2016-02-29 DIAGNOSIS — Y92009 Unspecified place in unspecified non-institutional (private) residence as the place of occurrence of the external cause: Secondary | ICD-10-CM | POA: Diagnosis not present

## 2016-02-29 DIAGNOSIS — T361X5A Adverse effect of cephalosporins and other beta-lactam antibiotics, initial encounter: Secondary | ICD-10-CM | POA: Diagnosis present

## 2016-02-29 DIAGNOSIS — Z86711 Personal history of pulmonary embolism: Secondary | ICD-10-CM | POA: Diagnosis not present

## 2016-02-29 DIAGNOSIS — M6281 Muscle weakness (generalized): Secondary | ICD-10-CM

## 2016-02-29 DIAGNOSIS — Z7982 Long term (current) use of aspirin: Secondary | ICD-10-CM | POA: Diagnosis not present

## 2016-02-29 DIAGNOSIS — T7840XA Allergy, unspecified, initial encounter: Secondary | ICD-10-CM

## 2016-02-29 HISTORY — DX: Acute embolism and thrombosis of unspecified deep veins of unspecified lower extremity: I82.409

## 2016-02-29 HISTORY — DX: Essential (primary) hypertension: I10

## 2016-02-29 HISTORY — DX: Hyperlipidemia, unspecified: E78.5

## 2016-02-29 HISTORY — DX: Type 2 diabetes mellitus without complications: E11.9

## 2016-02-29 HISTORY — DX: Thyrotoxicosis, unspecified without thyrotoxic crisis or storm: E05.90

## 2016-02-29 HISTORY — DX: Malignant (primary) neoplasm, unspecified: C80.1

## 2016-02-29 HISTORY — DX: Diaphragmatic hernia without obstruction or gangrene: K44.9

## 2016-02-29 HISTORY — DX: Unspecified asthma, uncomplicated: J45.909

## 2016-02-29 LAB — CBC WITH DIFFERENTIAL/PLATELET
Basophils Absolute: 0 10*3/uL (ref 0–0.1)
Basophils Relative: 1 %
EOS ABS: 0.4 10*3/uL (ref 0–0.7)
EOS PCT: 7 %
HCT: 38.6 % (ref 35.0–47.0)
HEMOGLOBIN: 13 g/dL (ref 12.0–16.0)
LYMPHS ABS: 0.8 10*3/uL — AB (ref 1.0–3.6)
LYMPHS PCT: 12 %
MCH: 27 pg (ref 26.0–34.0)
MCHC: 33.6 g/dL (ref 32.0–36.0)
MCV: 80.5 fL (ref 80.0–100.0)
MONOS PCT: 7 %
Monocytes Absolute: 0.5 10*3/uL (ref 0.2–0.9)
NEUTROS PCT: 73 %
Neutro Abs: 4.8 10*3/uL (ref 1.4–6.5)
Platelets: 216 10*3/uL (ref 150–440)
RBC: 4.8 MIL/uL (ref 3.80–5.20)
RDW: 13.1 % (ref 11.5–14.5)
WBC: 6.5 10*3/uL (ref 3.6–11.0)

## 2016-02-29 LAB — BASIC METABOLIC PANEL
Anion gap: 8 (ref 5–15)
BUN: 9 mg/dL (ref 6–20)
CHLORIDE: 104 mmol/L (ref 101–111)
CO2: 29 mmol/L (ref 22–32)
CREATININE: 0.58 mg/dL (ref 0.44–1.00)
Calcium: 8.9 mg/dL (ref 8.9–10.3)
GFR calc Af Amer: >60 mL/min (ref >60)
GFR calc non Af Amer: >60 mL/min (ref >60)
GLUCOSE: 111 mg/dL — AB (ref 65–99)
Potassium: 3.4 mmol/L — ABNORMAL LOW (ref 3.5–5.1)
Sodium: 141 mmol/L (ref 135–145)

## 2016-02-29 LAB — GLUCOSE, CAPILLARY
GLUCOSE-CAPILLARY: 108 mg/dL — AB (ref 65–99)
GLUCOSE-CAPILLARY: 94 mg/dL (ref 65–99)
GLUCOSE-CAPILLARY: 91 mg/dL (ref 65–99)

## 2016-02-29 LAB — FIBRIN DERIVATIVES D-DIMER (ARMC ONLY): Fibrin derivatives D-dimer (ARMC): 2111 — ABNORMAL HIGH (ref 0–499)

## 2016-02-29 MED ORDER — TIOTROPIUM BROMIDE MONOHYDRATE 18 MCG IN CAPS
18.0000 ug | ORAL_CAPSULE | Freq: Every day | RESPIRATORY_TRACT | Status: DC
  Administered 2016-02-29 – 2016-03-02 (×3): 18 ug via RESPIRATORY_TRACT
  Filled 2016-02-29: qty 5

## 2016-02-29 MED ORDER — FAMOTIDINE IN NACL 20-0.9 MG/50ML-% IV SOLN
20.0000 mg | Freq: Once | INTRAVENOUS | Status: AC
  Administered 2016-02-29: 20 mg via INTRAVENOUS
  Filled 2016-02-29: qty 50

## 2016-02-29 MED ORDER — METAXALONE 800 MG PO TABS
800.0000 mg | ORAL_TABLET | Freq: Three times a day (TID) | ORAL | Status: DC | PRN
  Administered 2016-02-29: 800 mg via ORAL
  Filled 2016-02-29 (×2): qty 1

## 2016-02-29 MED ORDER — INSULIN ASPART 100 UNIT/ML ~~LOC~~ SOLN: 0.0000 [IU] | Freq: Three times a day (TID) | SUBCUTANEOUS | Status: DC

## 2016-02-29 MED ORDER — HYDROCODONE-ACETAMINOPHEN 5-325 MG PO TABS
1.0000 | ORAL_TABLET | ORAL | Status: DC | PRN
  Administered 2016-03-01: 1 via ORAL
  Filled 2016-02-29 (×2): qty 1

## 2016-02-29 MED ORDER — LEVOFLOXACIN IN D5W 500 MG/100ML IV SOLN
500.0000 mg | INTRAVENOUS | Status: DC
  Administered 2016-02-29: 500 mg via INTRAVENOUS
  Filled 2016-02-29: qty 100

## 2016-02-29 MED ORDER — ONDANSETRON HCL 4 MG/2ML IJ SOLN: 4.0000 mg | Freq: Four times a day (QID) | INTRAMUSCULAR | Status: DC | PRN

## 2016-02-29 MED ORDER — SODIUM CHLORIDE 0.9 % IV SOLN: 250.0000 mL | INTRAVENOUS | Status: DC | PRN

## 2016-02-29 MED ORDER — ALENDRONATE SODIUM 70 MG PO TABS: 70.0000 mg | ORAL_TABLET | ORAL | Status: DC

## 2016-02-29 MED ORDER — VANCOMYCIN HCL IN DEXTROSE 750-5 MG/150ML-% IV SOLN
750.0000 mg | INTRAVENOUS | Status: DC
  Administered 2016-02-29: 750 mg via INTRAVENOUS
  Filled 2016-02-29 (×2): qty 150

## 2016-02-29 MED ORDER — ACETAMINOPHEN 650 MG RE SUPP: 650.0000 mg | Freq: Four times a day (QID) | RECTAL | Status: DC | PRN

## 2016-02-29 MED ORDER — LORATADINE 10 MG PO TABS
10.0000 mg | ORAL_TABLET | Freq: Every day | ORAL | Status: DC
  Administered 2016-02-29 – 2016-03-02 (×3): 10 mg via ORAL
  Filled 2016-02-29 (×3): qty 1

## 2016-02-29 MED ORDER — IPRATROPIUM-ALBUTEROL 0.5-2.5 (3) MG/3ML IN SOLN: 3.0000 mL | Freq: Four times a day (QID) | RESPIRATORY_TRACT | Status: DC | PRN

## 2016-02-29 MED ORDER — AMLODIPINE BESYLATE 5 MG PO TABS
ORAL_TABLET | ORAL | Status: AC
  Administered 2016-02-29: 5 mg via ORAL
  Filled 2016-02-29: qty 1

## 2016-02-29 MED ORDER — AMLODIPINE BESYLATE 5 MG PO TABS
5.0000 mg | ORAL_TABLET | Freq: Every day | ORAL | Status: DC
  Administered 2016-02-29 – 2016-03-02 (×3): 5 mg via ORAL
  Filled 2016-02-29 (×3): qty 1

## 2016-02-29 MED ORDER — ALBUTEROL SULFATE HFA 108 (90 BASE) MCG/ACT IN AERS: 2.0000 | INHALATION_SPRAY | RESPIRATORY_TRACT | Status: DC | PRN

## 2016-02-29 MED ORDER — PREDNISOLONE 5 MG PO TABS: 60.0000 mg | ORAL_TABLET | Freq: Every day | ORAL | Status: DC

## 2016-02-29 MED ORDER — ENOXAPARIN SODIUM 40 MG/0.4ML ~~LOC~~ SOLN
40.0000 mg | SUBCUTANEOUS | Status: DC
  Administered 2016-02-29 – 2016-03-01 (×2): 40 mg via SUBCUTANEOUS
  Filled 2016-02-29 (×2): qty 0.4

## 2016-02-29 MED ORDER — ACETAMINOPHEN 500 MG PO TABS: 500.0000 mg | ORAL_TABLET | ORAL | Status: DC | PRN

## 2016-02-29 MED ORDER — SODIUM CHLORIDE 0.9% FLUSH
3.0000 mL | Freq: Two times a day (BID) | INTRAVENOUS | Status: DC
  Administered 2016-02-29 – 2016-03-02 (×4): 3 mL via INTRAVENOUS

## 2016-02-29 MED ORDER — ONDANSETRON HCL 4 MG PO TABS: 4.0000 mg | ORAL_TABLET | Freq: Four times a day (QID) | ORAL | Status: DC | PRN

## 2016-02-29 MED ORDER — DIPHENHYDRAMINE HCL 50 MG/ML IJ SOLN: 25.0000 mg | Freq: Four times a day (QID) | INTRAMUSCULAR | Status: DC | PRN

## 2016-02-29 MED ORDER — CLONIDINE HCL 0.1 MG PO TABS
ORAL_TABLET | ORAL | Status: AC
  Administered 2016-02-29: 0.1 mg via ORAL
  Filled 2016-02-29: qty 1

## 2016-02-29 MED ORDER — DIPHENHYDRAMINE HCL 25 MG PO CAPS
25.0000 mg | ORAL_CAPSULE | Freq: Four times a day (QID) | ORAL | Status: DC | PRN
  Administered 2016-02-29: 25 mg via ORAL
  Filled 2016-02-29: qty 1

## 2016-02-29 MED ORDER — FE FUMARATE-B12-VIT C-FA-IFC PO CAPS
1.0000 | ORAL_CAPSULE | Freq: Every day | ORAL | Status: DC
  Administered 2016-03-01 – 2016-03-02 (×2): 1 via ORAL
  Filled 2016-02-29 (×2): qty 1

## 2016-02-29 MED ORDER — IPRATROPIUM-ALBUTEROL 0.5-2.5 (3) MG/3ML IN SOLN
3.0000 mL | Freq: Four times a day (QID) | RESPIRATORY_TRACT | Status: DC
  Administered 2016-02-29: 3 mL via RESPIRATORY_TRACT
  Filled 2016-02-29: qty 3

## 2016-02-29 MED ORDER — GABAPENTIN 300 MG PO CAPS
300.0000 mg | ORAL_CAPSULE | Freq: Three times a day (TID) | ORAL | Status: DC
  Administered 2016-02-29 – 2016-03-02 (×6): 300 mg via ORAL
  Filled 2016-02-29 (×6): qty 1

## 2016-02-29 MED ORDER — SIMVASTATIN 10 MG PO TABS
10.0000 mg | ORAL_TABLET | Freq: Every day | ORAL | Status: DC
  Administered 2016-02-29 – 2016-03-01 (×2): 10 mg via ORAL
  Filled 2016-02-29 (×3): qty 1

## 2016-02-29 MED ORDER — DIPHENHYDRAMINE HCL 50 MG/ML IJ SOLN
12.5000 mg | Freq: Once | INTRAMUSCULAR | Status: AC
  Administered 2016-02-29: 12.5 mg via INTRAVENOUS
  Filled 2016-02-29: qty 1

## 2016-02-29 MED ORDER — POTASSIUM CHLORIDE CRYS ER 20 MEQ PO TBCR
40.0000 meq | EXTENDED_RELEASE_TABLET | Freq: Three times a day (TID) | ORAL | Status: DC
  Administered 2016-02-29 – 2016-03-02 (×6): 40 meq via ORAL
  Filled 2016-02-29 (×7): qty 2

## 2016-02-29 MED ORDER — PREDNISONE 20 MG PO TABS: 60.0000 mg | ORAL_TABLET | Freq: Every day | ORAL | Status: DC

## 2016-02-29 MED ORDER — ASPIRIN EC 81 MG PO TBEC
81.0000 mg | DELAYED_RELEASE_TABLET | Freq: Every day | ORAL | Status: DC
  Administered 2016-02-29 – 2016-03-02 (×3): 81 mg via ORAL
  Filled 2016-02-29 (×3): qty 1

## 2016-02-29 MED ORDER — CALCIUM CARBONATE-VITAMIN D 500-200 MG-UNIT PO TABS
1.0000 | ORAL_TABLET | Freq: Two times a day (BID) | ORAL | Status: DC
  Administered 2016-02-29 – 2016-03-02 (×4): 1 via ORAL
  Filled 2016-02-29 (×4): qty 1

## 2016-02-29 MED ORDER — SODIUM CHLORIDE 0.9% FLUSH: 3.0000 mL | INTRAVENOUS | Status: DC | PRN

## 2016-02-29 MED ORDER — ACETAMINOPHEN 325 MG PO TABS: 650.0000 mg | ORAL_TABLET | Freq: Four times a day (QID) | ORAL | Status: DC | PRN

## 2016-02-29 MED ORDER — BISACODYL 10 MG RE SUPP: 10.0000 mg | Freq: Every day | RECTAL | Status: DC | PRN

## 2016-02-29 MED ORDER — FUROSEMIDE 40 MG PO TABS
40.0000 mg | ORAL_TABLET | Freq: Every day | ORAL | Status: DC
  Administered 2016-02-29 – 2016-03-02 (×3): 40 mg via ORAL
  Filled 2016-02-29 (×3): qty 1

## 2016-02-29 MED ORDER — CLONIDINE HCL 0.1 MG PO TABS
0.1000 mg | ORAL_TABLET | Freq: Two times a day (BID) | ORAL | Status: DC
  Administered 2016-02-29 – 2016-03-02 (×5): 0.1 mg via ORAL
  Filled 2016-02-29 (×4): qty 1

## 2016-02-29 MED ORDER — ALBUTEROL SULFATE (2.5 MG/3ML) 0.083% IN NEBU: 2.5000 mg | INHALATION_SOLUTION | RESPIRATORY_TRACT | Status: DC | PRN

## 2016-02-29 MED ORDER — EXEMESTANE 25 MG PO TABS
25.0000 mg | ORAL_TABLET | Freq: Every day | ORAL | Status: DC
  Filled 2016-02-29: qty 1

## 2016-02-29 NOTE — ED Provider Notes (Addendum)
[facility] Emergency Department Provider Note  ____________________________________________   I have reviewed the triage vital signs and the nursing notes.   HISTORY  Chief Complaint Allergic Reaction    HPI Lindsey English is a 82 y.o. female who presents today with 2 different complains for the first is that she has a history of chronic right lower extremity wounds which have become infected according to her wound clinic. They started her on Keflex therefore a few days ago and today she had itching. She states she was itching all night. She has a diffuse mild rash. She also states she had a little bit of shortness of breath. She did feeling generally weak since the wound became more painful and swollen last Monday. Patient has not had high fever. He has had similar before. No history of allergies to medications. No other new medications aside from Keflex according to patient and husband. She has not taken anything for the itching and states that it is starting to resolve.    Past Medical History:  Diagnosis Date  . Cancer ([place])    Right  . Diabetes mellitus without complication ([place])   . Hypertension     There are no active problems to display for this patient.   Past Surgical History:  Procedure Laterality Date  . ABDOMINAL HYSTERECTOMY    . BREAST SURGERY Right   . TOTAL HIP ARTHROPLASTY Left     Prior to Admission medications   Not on File    Allergies Shrimp [shellfish allergy]  History reviewed. No pertinent family history.  Social History Social History  Substance Use Topics  . Smoking status: Former Research scientist (life sciences)  . Smokeless tobacco: Never Used  . Alcohol use No    Review of Systems Constitutional: No fever/chills Eyes: No visual changes. ENT: No sore throat. No stiff neck no neck pain Cardiovascular: Denies chest pain. Respiratory: "A little" shortness of breath. Gastrointestinal:   no vomiting.  No diarrhea.  No  constipation. Genitourinary: Negative for dysuria. Musculoskeletal: Negative lower extremity swelling Skin:See history of present illness Neurological: Negative for severe headaches, focal weakness or numbness. 10-point ROS otherwise negative.  ____________________________________________   PHYSICAL EXAM:  VITAL SIGNS: ED Triage Vitals  Enc Vitals Group     BP 02/29/16 0858 130/74     Pulse Rate 02/29/16 0858 78     Resp 02/29/16 0858 18     Temp 02/29/16 0858 98.7 F (37.1 C)     Temp Source 02/29/16 0858 Oral     SpO2 02/29/16 0858 94 %     Weight 02/29/16 0859 123 lb (55.8 kg)     Height 02/29/16 0859 5' (1.524 m)     Head Circumference --      Peak Flow --      Pain Score 02/29/16 0909 2     Pain Loc --      Pain Edu? --      Excl. in [place]? --     Constitutional: Alert and oriented. Well appearing and in no acute distress. Eyes: Conjunctivae are normal. PERRL. EOMI. Head: Atraumatic. Nose: No congestion/rhinnorhea. Mouth/Throat: Mucous membranes are moist.  Oropharynx non-erythematous. Neck: No stridor.   Nontender with no meningismus Cardiovascular: Normal rate, regular rhythm. Grossly normal heart sounds.  Good peripheral circulation. Respiratory: Normal respiratory effort.  No retractions. Lungs CTAB. Abdominal: Soft and nontender. No distention. No guarding no rebound Back:  There is no focal tenderness or step off.  there is no midline tenderness there  are no lesions noted. there is no CVA tenderness Musculoskeletal: No lower extremity tenderness, no upper extremity tenderness. No joint effusions, no DVT signs strong distal pulses no edema Neurologic:  Normal speech and language. No gross focal neurologic deficits are appreciated.  Skin:  Skin is warm, dry and intactThere is diffuse desquamation which appears chronic to the right lower extremity with erythema and heat going up the shin. It is red, it is not fluctuant and there are strong distal pulses. Additionally,  there is a small area underneath the underwear that appears to be possibly a fungal rash, as well as a diffuse mild erythema to the skin. Occasional possible hives noted on upper extremities.Marland Kitchen Psychiatric: Mood and affect are normal. Speech and behavior are normal.  ____________________________________________   LABS (all labs ordered are listed, but only abnormal results are displayed)  Labs Reviewed  CBC WITH DIFFERENTIAL/PLATELET - Abnormal; Notable for the following:       Result Value   Lymphs Abs 0.8 (*)    All other components within normal limits  BASIC METABOLIC PANEL - Abnormal; Notable for the following:    Potassium 3.4 (*)    Glucose, Bld 111 (*)    All other components within normal limits  GLUCOSE, CAPILLARY - Abnormal; Notable for the following:    Glucose-Capillary 108 (*)    All other components within normal limits  CULTURE, BLOOD (ROUTINE X 2)  CULTURE, BLOOD (ROUTINE X 2)  CBG MONITORING, ED   ____________________________________________  EKG  I personally interpreted any EKGs ordered by me or triage  Sinus rhythm at 76 beats per an acute ST elevation or acute ST depression, LVH noted. Normal axis  ____________________________________________  RADIOLOGY  I reviewed any imaging ordered by me or triage that were performed during my shift and, if possible, patient and/or family made aware of any abnormal findings. ____________________________________________   PROCEDURES  Procedure(s) performed: None  Procedures  Critical Care performed: None  ____________________________________________   INITIAL IMPRESSION / ASSESSMENT AND PLAN / ED COURSE  Pertinent labs & imaging results that were available during my care of the patient were reviewed by me and considered in my medical decision making (see chart for details).  Patient here with multiple complaints. The first is that she appears to have a mild allergic reaction to the Keflex. No evidence of  anaphylaxis. We will start her on Pepcid and Benadryl, I'll hold off on steroids as she is a diabetic and has an active infection on her foot, if needed we will give it, lungs are clear no evidence of significant shortness of breath. We will observe her closely. #2 mild shortness of breath the skin along with her hives. Again no evidence of wheezing noted, however will get a chest x-ray and check EKG. Patient also has what appears to be a acute on chronic cellulitis to the right lower extremity. She has failed outpatient management on Keflex, she appears to believe that the swelling is getting worse there. No evidence of DVT, this certainly appears to be superficial cellulitic process is no evidence of compartment syndrome no evidence of abscess. We will likely have to change the antibiotics for this and admit her. Keflex ruled out because of allergic reaction, we will try clindamycin which also give [place] better MRSA coverage.  ----------------------------------------- 10:28 AM on 02/29/2016 -----------------------------------------  Do not any longer detect any evidence of hives, itching is gone she states feeling better.  /b ----------------------------------------- 10:54 AM on 02/29/2016 -----------------------------------------  Vital patient  should be admitted for IV antibiotics for her cellulitis, I also feel we should wait until her allergic reaction is completely gone before starting new antibiotics as that is is a lesser concern. Discussed with the hospitalist and they agree.  Clinical Course   ____________________________________________   FINAL CLINICAL IMPRESSION(S) / ED DIAGNOSES  Final diagnoses:  None      This chart was dictated using voice recognition software.  Despite best efforts to proofread,  errors can occur which can change meaning.      Schuyler Amor, MD 02/29/16 [id number]    Schuyler Amor, MD 02/29/16 [place], MD 02/29/16 1055

## 2016-02-29 NOTE — H&P (Signed)
[facility] [place] NAME: Lindsey English    MR#:  [id number]  DATE OF BIRTH:  April 23, 1930  DATE OF ADMISSION:  02/29/2016  PRIMARY CARE PHYSICIAN: Madelyn Brunner, MD   REQUESTING/REFERRING PHYSICIAN: Schuyler Amor, MD  CHIEF COMPLAINT:   Chief Complaint  Patient presents with  . Allergic Reaction    HISTORY OF PRESENT ILLNESS: Lindsey English  is a 82 y.o. female with a known history of  Venous insufficiency who has history of having right leg erythema and swelling. She was seen at an urgent care a few days ago and was started on Keflex for right lower extremity cellulitis. After taking the Keflex patient started developing itching all over her body and a rash. Patient came to the ED with these symptoms. Her right lower extremity is very erythematous and there is also some skin that is sloughing off. Patient is very weak and unable to ambulate. She denies any chest pain palpitations no nausea vomiting or diarrhea.  PAST MEDICAL HISTORY:   Past Medical History:  Diagnosis Date  . Asthma   . Cancer ([place])    Right  . Diabetes mellitus without complication ([place])   . DVT (deep venous thrombosis) ([place])   . Hiatal hernia   . Hyperlipemia   . Hypertension   . Hyperthyroidism     PAST SURGICAL HISTORY: Past Surgical History:  Procedure Laterality Date  . ABDOMINAL HYSTERECTOMY    . BREAST SURGERY Right   . TOTAL HIP ARTHROPLASTY Left     SOCIAL HISTORY:  Social History  Substance Use Topics  . Smoking status: Former Research scientist (life sciences)  . Smokeless tobacco: Never Used  . Alcohol use No    FAMILY HISTORY:  Family History  Problem Relation Age of Onset  . Hypertension Mother     DRUG ALLERGIES:  Allergies  Allergen Reactions  . Keflex [Cephalexin]   . Shrimp [Shellfish Allergy]   . Sulfa Antibiotics Nausea Only    REVIEW OF SYSTEMS:   CONSTITUTIONAL: No fever, fatigue or weakness.  EYES: No blurred or double vision.  EARS,  NOSE, AND THROAT: No tinnitus or ear pain.  RESPIRATORY: No cough, Positive shortness of breath, no  wheezing or hemoptysis.  CARDIOVASCULAR: No chest pain, orthopnea, edema.  GASTROINTESTINAL: No nausea, vomiting, diarrhea or abdominal pain.  GENITOURINARY: No dysuria, hematuria.  ENDOCRINE: No polyuria, nocturia,  HEMATOLOGY: No anemia, easy bruising or bleedingBilateral lower extremity erythema, erythematous rash all over her body USCULOSKELETAL: No joint pain or arthritis.   NEUROLOGIC: No tingling, numbness, weakness.  PSYCHIATRY: No anxiety or depression.   MEDICATIONS AT HOME:  Prior to Admission medications   Medication Sig Start Date End Date Taking? Authorizing Provider  acetaminophen (TYLENOL) 500 MG tablet Take 500 mg by mouth every 4 (four) hours as needed for mild pain.    Yes Historical Provider, MD  albuterol (PROVENTIL HFA;VENTOLIN HFA) 108 (90 Base) MCG/ACT inhaler Inhale 2 puffs into the lungs every 4 (four) hours as needed for wheezing.   Yes Historical Provider, MD  alendronate (FOSAMAX) 70 MG tablet Take 70 mg by mouth every Wednesday.   Yes Historical Provider, MD  amLODipine (NORVASC) 5 MG tablet Take 5 mg by mouth daily.   Yes Historical Provider, MD  aspirin EC 81 MG tablet Take 81 mg by mouth daily.   Yes Historical Provider, MD  bisacodyl (DULCOLAX) 10 MG suppository Place 10 mg rectally daily as needed for moderate constipation.  Yes Historical Provider, MD  Calcium Carbonate-Vitamin D (CALCIUM 600+D) 600-200 MG-UNIT TABS Take 1 tablet by mouth 2 (two) times daily.   Yes Historical Provider, MD  cephALEXin (KEFLEX) 500 MG capsule Take 500 mg by mouth 3 (three) times daily. For 10 days 02/24/16 03/05/16 Yes Historical Provider, MD  cetirizine (ZYRTEC) 10 MG tablet Take 10 mg by mouth daily.   Yes Historical Provider, MD  cloNIDine (CATAPRES) 0.1 MG tablet Take 0.1 mg by mouth 2 (two) times daily.   Yes Historical Provider, MD  exemestane (AROMASIN) 25 MG tablet  Take 25 mg by mouth daily.   Yes Historical Provider, MD  ferrous [id number] C-folic acid (TRINSICON / FOLTRIN) capsule Take 1 capsule by mouth daily before breakfast.   Yes Historical Provider, MD  furosemide (LASIX) 40 MG tablet Take 40 mg by mouth daily.   Yes Historical Provider, MD  furosemide (LASIX) 40 MG tablet Take 40 mg by mouth daily as needed for edema. In addition to the daily scheduled dose   Yes Historical Provider, MD  gabapentin (NEURONTIN) 300 MG capsule Take 300 mg by mouth 3 (three) times daily.   Yes Historical Provider, MD  metaxalone (SKELAXIN) 800 MG tablet Take 800 mg by mouth 3 (three) times daily as needed for muscle spasms.    Yes Historical Provider, MD  Multiple Vitamins-Minerals (CENTRUM SILVER PO) Take 1 tablet by mouth daily.   Yes Historical Provider, MD  potassium chloride SA (K-DUR,KLOR-CON) 20 MEQ tablet Take 40 mEq by mouth 3 (three) times daily.   Yes Historical Provider, MD  simvastatin (ZOCOR) 10 MG tablet Take 10 mg by mouth at bedtime.   Yes Historical Provider, MD  tiotropium (SPIRIVA) 18 MCG inhalation capsule Place 18 mcg into inhaler and inhale daily.   Yes Historical Provider, MD      PHYSICAL EXAMINATION:   VITAL SIGNS: Blood pressure (!) 173/68, pulse 67, temperature 98.7 F (37.1 C), temperature source Oral, resp. rate 18, height 5' (1.524 m), weight 123 lb (55.8 kg), SpO2 95 %.  GENERAL:  82 y.o.-year-old patient lying in the bed with no acute distress.  EYES: Pupils equal, round, reactive to light and accommodation. No scleral icterus. Extraocular muscles intact.  HEENT: Head atraumatic, normocephalic. Oropharynx and nasopharynx clear.  NECK:  Supple, no jugular venous distention. No thyroid enlargement, no tenderness.  LUNGS: Normal breath sounds bilaterally, no wheezing, rales,rhonchi or crepitation. No use of accessory muscles of respiration.  CARDIOVASCULAR: S1, S2 normal. No murmurs, rubs, or gallops.  ABDOMEN: Soft,  nontender, nondistended. Bowel sounds present. No organomegaly or mass.  EXTREMITIES:Has bilateral lower extremity edema Neurologic: Cranial nerves II through XII are intact. Muscle strength 5/5 in all extremities. Sensation intact. Gait not checked.  PSYCHIATRIC: The patient is alert and oriented x 3.  SKIN:  bilateral lower extremity erythema below her knee, has right lower extremity there is a area near her ankle joint that is sloughing of skin. Also a papular rash throughout her whole body  LABORATORY PANEL:   CBC  Recent Labs Lab 02/29/16 0918  WBC 6.5  HGB 13.0  HCT 38.6  PLT 216  MCV 80.5  MCH 27.0  MCHC 33.6  RDW 13.1  LYMPHSABS 0.8*  MONOABS 0.5  EOSABS 0.4  BASOSABS 0.0   ------------------------------------------------------------------------------------------------------------------  Chemistries   Recent Labs Lab 02/29/16 0918  NA 141  K 3.4*  CL 104  CO2 29  GLUCOSE 111*  BUN 9  CREATININE 0.58  CALCIUM 8.9   ------------------------------------------------------------------------------------------------------------------ estimated  creatinine clearance is 41 mL/min (by C-G formula based on SCr of 0.58 mg/dL). ------------------------------------------------------------------------------------------------------------------ No results for input(s): TSH, T4TOTAL, T3FREE, THYROIDAB in the last 72 hours.  Invalid input(s): FREET3   Coagulation profile No results for input(s): INR, PROTIME in the last 168 hours. ------------------------------------------------------------------------------------------------------------------- No results for input(s): DDIMER in the last 72 hours. -------------------------------------------------------------------------------------------------------------------  Cardiac Enzymes No results for input(s): CKMB, TROPONINI, MYOGLOBIN in the last 168 hours.  Invalid input(s):  CK ------------------------------------------------------------------------------------------------------------------ Invalid input(s): POCBNP  ---------------------------------------------------------------------------------------------------------------  Urinalysis    Component Value Date/Time   COLORURINE Yellow 06/11/2014 1553   APPEARANCEUR Clear 06/11/2014 1553   LABSPEC 1.015 06/11/2014 1553   PHURINE 7.0 06/11/2014 1553   GLUCOSEU Negative 06/11/2014 1553   HGBUR Negative 06/11/2014 1553   BILIRUBINUR Negative 06/11/2014 1553   KETONESUR Negative 06/11/2014 1553   PROTEINUR Negative 06/11/2014 1553   NITRITE Negative 06/11/2014 1553   LEUKOCYTESUR Negative 06/11/2014 1553     RADIOLOGY: Dg Chest Portable 1 View  Result Date: 02/29/2016 CLINICAL DATA:  Shortness of breath.  History breast carcinoma EXAM: PORTABLE CHEST 1 VIEW COMPARISON:  None. FINDINGS: There is no edema or consolidation. Heart is mildly enlarged with pulmonary vascularity within limits. No adenopathy. There is atherosclerotic calcification in the aorta. No adenopathy. Bones are somewhat osteoporotic. IMPRESSION: No edema or consolidation. Aortic atherosclerosis. Borderline cardiomegaly. Electronically Signed   By: Lowella Grip III M.D.   On: 02/29/2016 10:05    EKG: Orders placed or performed during the hospital encounter of 02/29/16  . ED EKG  . ED EKG    IMPRESSION AND PLAN: patient is a 82 year old white female presenting with cellulitis and allergic reaction to Keflex   1. Allergic reaction to keflex We'll stop the Keflex Start patient on oral prednisone   2. Right lower extremity cellulitis We'll start patient on vancomycin and Levaquin  3. Essential hypertension Continue amlodipine and clonidine and I will add IV hydralazine due to elevated blood pressure now  4. Shortness of breath Chest x-ray is negative I will check a d-dimer may need a CT of the chest in light of a history of  PE in the past No evidence of bronchospasm continue albuterol and Spiriva  5. Diabetes type 2 We'll place on sliding scale insulin  6. Miscellaneous we'll do Lovenox for DVT prophylaxis  All the records are reviewed and case discussed with ED provider. Management plans discussed with the patient, family and they are in agreement.  CODE STATUS:    Code Status Orders        Start     Ordered   02/29/16 1106  Full code  Continuous     02/29/16 1107    Code Status History    Date Active Date Inactive Code Status Order ID Comments User Context   This patient has a current code status but no historical code status.    Advance Directive Documentation   [place] Most Recent Value  Type of Advance Directive  Living will  Pre-existing out of facility DNR order (yellow form or pink MOST form)  No data  "MOST" Form in Place?  No data       TOTAL TIME TAKING CARE OF THIS PATIENT: 55 minutes.    Dustin Flock M.D on 02/29/2016 at 11:15 AM  Between 7am to 6pm - Pager - [phone]  After 6pm go to [email or web address] - password [email or web address] [facility]  Office  [phone]  CC: Primary care physician; Madelyn Brunner,  MD

## 2016-02-29 NOTE — ED Notes (Signed)
Patient reports drainage of right lower leg at home. No drainage noted at the present time. Patient reports she is itching all over.

## 2016-02-29 NOTE — ED Triage Notes (Signed)
Pt has been taking antibiotic (keflex) for a couple of days due to wound on R leg. Patient reports waking up this morning and having a red rash all over her body that itches and SOB. Patient reports weakness for the past week. Pt A&O x4

## 2016-02-29 NOTE — Progress Notes (Signed)
Pharmacy Antibiotic Note  Lindsey English is a 82 y.o. female admitted on 02/29/2016 with cellulitis.  Pharmacy has been consulted for Vancomycin dosing. Patient was taking Keflex prior to admission but developed itching and rash.   Ke=0.0.34, t1/2=20hr, Vd=39L  Plan: Vancomycin 750 IV every 24 hours.  Goal trough 10-15 mcg/mL. Will trough level prior to 5th dose.  Height: 5' (152.4 cm) Weight: 123 lb (55.8 kg) IBW/kg (Calculated) : 45.5  Temp (24hrs), Avg:98.7 F (37.1 C), Min:98.7 F (37.1 C), Max:98.7 F (37.1 C)   Recent Labs Lab 02/29/16 0918  WBC 6.5  CREATININE 0.58    Estimated Creatinine Clearance: 41 mL/min (by C-G formula based on SCr of 0.58 mg/dL).    Allergies  Allergen Reactions  . Keflex [Cephalexin]   . Shrimp [Shellfish Allergy]   . Sulfa Antibiotics Nausea Only    Antimicrobials this admission: Vancomycin 10/9 >>  Levaquin 10/9 >>   Dose adjustments this admission:  Microbiology results:  Thank you for allowing pharmacy to be a part of this patient's care.  Paulina Fusi, PharmD, BCPS 02/29/2016 12:01 PM

## 2016-03-01 LAB — BASIC METABOLIC PANEL
Anion gap: 6 (ref 5–15)
BUN: 8 mg/dL (ref 6–20)
CHLORIDE: 108 mmol/L (ref 101–111)
CO2: 30 mmol/L (ref 22–32)
CREATININE: 0.55 mg/dL (ref 0.44–1.00)
Calcium: 8.8 mg/dL — ABNORMAL LOW (ref 8.9–10.3)
GFR calc non Af Amer: >60 mL/min (ref >60)
Glucose, Bld: 94 mg/dL (ref 65–99)
POTASSIUM: 4.1 mmol/L (ref 3.5–5.1)
SODIUM: 144 mmol/L (ref 135–145)

## 2016-03-01 LAB — CBC
HCT: 35.2 % (ref 35.0–47.0)
HEMOGLOBIN: 12 g/dL (ref 12.0–16.0)
MCH: 27.1 pg (ref 26.0–34.0)
MCHC: 34.1 g/dL (ref 32.0–36.0)
MCV: 79.5 fL — ABNORMAL LOW (ref 80.0–100.0)
Platelets: 204 10*3/uL (ref 150–440)
RBC: 4.43 MIL/uL (ref 3.80–5.20)
RDW: 13 % (ref 11.5–14.5)
WBC: 5.3 10*3/uL (ref 3.6–11.0)

## 2016-03-01 LAB — GLUCOSE, CAPILLARY
GLUCOSE-CAPILLARY: 93 mg/dL (ref 65–99)
GLUCOSE-CAPILLARY: 96 mg/dL (ref 65–99)
GLUCOSE-CAPILLARY: 155 mg/dL — AB (ref 65–99)
Glucose-Capillary: 93 mg/dL (ref 65–99)

## 2016-03-01 MED ORDER — VANCOMYCIN HCL IN DEXTROSE 750-5 MG/150ML-% IV SOLN
750.0000 mg | INTRAVENOUS | Status: DC
  Administered 2016-03-01: 750 mg via INTRAVENOUS
  Filled 2016-03-01 (×3): qty 150

## 2016-03-01 MED ORDER — PREDNISONE 20 MG PO TABS
20.0000 mg | ORAL_TABLET | Freq: Every day | ORAL | Status: DC
  Administered 2016-03-01 – 2016-03-02 (×2): 20 mg via ORAL
  Filled 2016-03-01 (×2): qty 1

## 2016-03-01 MED ORDER — OXYCODONE HCL 5 MG PO TABS: 5.0000 mg | ORAL_TABLET | ORAL | Status: DC | PRN

## 2016-03-01 NOTE — Progress Notes (Signed)
[facility] [place] NAME: Lindsey English    MRN#:  [id number]  DATE OF BIRTH:  03/12/1930  SUBJECTIVE:  Hospital Day: 1 day Lindsey English is a 82 y.o. female presenting with Allergic Reaction .   Overnight events: No overnight events Interval Events: States leg swelling has improved  REVIEW OF SYSTEMS:  CONSTITUTIONAL: No fever, fatigue or weakness.  EYES: No blurred or double vision.  EARS, NOSE, AND THROAT: No tinnitus or ear pain.  RESPIRATORY: No cough, shortness of breath, wheezing or hemoptysis.  CARDIOVASCULAR: No chest pain, orthopnea, edema.  GASTROINTESTINAL: No nausea, vomiting, diarrhea or abdominal pain.  GENITOURINARY: No dysuria, hematuria.  ENDOCRINE: No polyuria, nocturia,  HEMATOLOGY: No anemia, easy bruising or bleeding SKIN: Both legs red, ulcer on right leg otherwise No rash or lesion. MUSCULOSKELETAL: No joint pain or arthritis.   NEUROLOGIC: No tingling, numbness, weakness.  PSYCHIATRY: No anxiety or depression.   DRUG ALLERGIES:   Allergies  Allergen Reactions  . Shrimp [Shellfish Allergy]   . Sulfa Antibiotics Nausea Only  . Keflex [Cephalexin] Rash    VITALS:  Blood pressure (!) 142/63, pulse 70, temperature 97.7 F (36.5 C), temperature source Oral, resp. rate 17, height 5' (1.524 m), weight 55.8 kg (123 lb), SpO2 93 %.  PHYSICAL EXAMINATION:  VITAL SIGNS: Vitals:   03/01/16 1017 03/01/16 1352  BP: (!) 150/70 (!) 142/63  Pulse:  70  Resp:    Temp:  97.7 F (36.5 C)   GENERAL:82 y.o.female currently in no acute distress.  HEAD: Normocephalic, atraumatic.  EYES: Pupils equal, round, reactive to light. Extraocular muscles intact. No scleral icterus.  MOUTH: Moist mucosal membrane. Dentition intact. No abscess noted.  EAR, NOSE, THROAT: Clear without exudates. No external lesions.  NECK: Supple. No thyromegaly. No nodules. No JVD.  PULMONARY: Clear to ascultation, without wheeze rails or  rhonci. No use of accessory muscles, Good respiratory effort. good air entry bilaterally CHEST: Nontender to palpation.  CARDIOVASCULAR: S1 and S2. Regular rate and rhythm. No murmurs, rubs, or gallops. No edema. Pedal pulses 2+ bilaterally.  GASTROINTESTINAL: Soft, nontender, nondistended. No masses. Positive bowel sounds. No hepatosplenomegaly.  MUSCULOSKELETAL: No swelling, clubbing, or edema. Range of motion full in all extremities.  NEUROLOGIC: Cranial nerves II through XII are intact. No gross focal neurological deficits. Sensation intact. Reflexes intact.  SKIN: Bilateral lower extremity erythema up to mid shin, ulceration medial aspect right leg otherwise No ulceration, lesions, rashes, or cyanosis. Skin warm and dry. Turgor intact.  PSYCHIATRIC: Mood, affect within normal limits. The patient is awake, alert and oriented x 3. Insight, judgment intact.      LABORATORY PANEL:   CBC  Recent Labs Lab 03/01/16 0523  WBC 5.3  HGB 12.0  HCT 35.2  PLT 204   ------------------------------------------------------------------------------------------------------------------  Chemistries   Recent Labs Lab 03/01/16 0523  NA 144  K 4.1  CL 108  CO2 30  GLUCOSE 94  BUN 8  CREATININE 0.55  CALCIUM 8.8*   ------------------------------------------------------------------------------------------------------------------  Cardiac Enzymes No results for input(s): TROPONINI in the last 168 hours. ------------------------------------------------------------------------------------------------------------------  RADIOLOGY:  Dg Chest Portable 1 View  Result Date: 02/29/2016 CLINICAL DATA:  Shortness of breath.  History breast carcinoma EXAM: PORTABLE CHEST 1 VIEW COMPARISON:  None. FINDINGS: There is no edema or consolidation. Heart is mildly enlarged with pulmonary vascularity within limits. No adenopathy. There is atherosclerotic calcification in the aorta. No adenopathy. Bones are  somewhat osteoporotic. IMPRESSION: No edema or  consolidation. Aortic atherosclerosis. Borderline cardiomegaly. Electronically Signed   By: Lowella Grip III M.D.   On: 02/29/2016 10:05    EKG:   Orders placed or performed during the hospital encounter of 02/29/16  . ED EKG  . ED EKG    ASSESSMENT AND PLAN:   Lindsey English is a 82 y.o. female presenting with Allergic Reaction . Admitted 02/29/2016 : Day #: 1 day 1. Cellulitis failed outpatient treatment: Currently on vancomycin and Levaquin, discontinue Levaquin will continue vancomycin for now given severity with plans on transitioning to oral likely tomorrow, consult wound therapy 2. Allergic reaction to Keflex: Resolved, quick taper of steroids 3. Essential hypertension: Norvasc clonidine 4. Type 2 diabetes sliding scale insulin   All the records are reviewed and case discussed with Care Management/Social Workerr. Management plans discussed with the patient, family and they are in agreement.  CODE STATUS: full TOTAL TIME TAKING CARE OF THIS PATIENT: 28 minutes.   POSSIBLE D/C IN 1-2DAYS, DEPENDING ON CLINICAL CONDITION.   [name],  Karenann Cai.D on 03/01/2016 at 2:12 PM  Between 7am to 6pm - Pager - [phone]  After 6pm: House Pager: - [facility]  Office  [phone]  CC: Primary care physician; Madelyn Brunner, MD

## 2016-03-02 LAB — GLUCOSE, CAPILLARY
GLUCOSE-CAPILLARY: 110 mg/dL — AB (ref 65–99)
Glucose-Capillary: 166 mg/dL — ABNORMAL HIGH (ref 65–99)

## 2016-03-02 MED ORDER — AMMONIUM LACTATE 12 % EX LOTN
TOPICAL_LOTION | Freq: Two times a day (BID) | CUTANEOUS | Status: DC
  Administered 2016-03-02: 11:00:00 via TOPICAL
  Filled 2016-03-02: qty 400

## 2016-03-02 MED ORDER — OXYCODONE HCL 5 MG PO TABS: 5.0000 mg | ORAL_TABLET | ORAL | 0 refills | Status: DC | PRN

## 2016-03-02 MED ORDER — CLINDAMYCIN HCL 300 MG PO CAPS: 300.0000 mg | ORAL_CAPSULE | Freq: Three times a day (TID) | ORAL | 0 refills | Status: AC

## 2016-03-02 NOTE — Progress Notes (Signed)
Pt d/c to home today.  IV removed intact.  Rx's given to pt w/all questions and concerns addressed.  D/C paperwork reviewed and education provided with all questions and concerns addressed.  Pt husband at bedside for home transport.  

## 2016-03-02 NOTE — Consult Note (Signed)
[facility] Nurse wound consult note Reason for Consult: Nonintact lesions to right anterior and lateral lower leg.  Cellulitis.  Unable to tolerate compression.  Lesions are sloughing off.  Will moisturize and observe.  On IV antibiotics.  Noted sulfur allergy.  Will avoid silver containing agents.  Wound type:Infectious, venous insufficiency Pressure Ulcer POA: N/A Measurement:Right anterior leg 3 cm x 3 cm dry, sloughing epithelium. Right lateral lower leg 4 cm x 3 cm  Wound [id number] skin reveals pink, tender skin Drainage (amount, consistency, odor)Weeping serous  No odor.  Erythema is improving.   Periwound:Dry skin, erythema, edema is resolving, per patient.   Dressing procedure/placement/frequency:Cleanse bilateral lower legs with soap and water.  Apply Amlactin cream twice daily.  Will not follow at this time.  Please re-consult if needed.  Domenic Moras RN BSN [place] Pager [phone]

## 2016-03-02 NOTE — Discharge Instructions (Signed)
Cellulitis °Cellulitis is an infection of the skin and the tissue under the skin. The infected area is usually red and tender. This happens most often in the arms and lower legs. °HOME CARE  °· Take your antibiotic medicine as told. Finish the medicine even if you start to feel better. °· Keep the infected arm or leg raised (elevated). °· Put a warm cloth on the area up to 4 times per day. °· Only take medicines as told by your doctor. °· Keep all doctor visits as told. °GET HELP IF: °· You see red streaks on the skin coming from the infected area. °· Your red area gets bigger or turns a dark color. °· Your bone or joint under the infected area is painful after the skin heals. °· Your infection comes back in the same area or different area. °· You have a puffy (swollen) bump in the infected area. °· You have new symptoms. °· You have a fever. °GET HELP RIGHT AWAY IF:  °· You feel very sleepy. °· You throw up (vomit) or have watery poop (diarrhea). °· You feel sick and have muscle aches and pains. °  °This information is not intended to replace advice given to you by your health care provider. Make sure you discuss any questions you have with your health care provider. °  °Document Released: 10/26/2007 Document Revised: 01/28/2015 Document Reviewed: 07/25/2011 °Elsevier Interactive Patient Education ©2016 [employer] ° °

## 2016-03-02 NOTE — Evaluation (Signed)
Physical Therapy Evaluation Patient Details Name: Lindsey English MRN: [id number] DOB: [birth year] Today's Date: 03/02/2016   History of Present Illness  Pt is a 82 y/o F admitted due to allergic reaction to Keflex that she was taking due to RLE cellulitis.  She was given steorids with improvement.  Pt's PMH includes L THA, cancer, DVT, hyperthryoidism, venous insufficiency.     Clinical Impression  Pt admitted with above diagnosis. Pt currently with functional limitations due to the deficits listed below (see PT Problem List). Mrs. Lanter presents with 10/10 pain BLEs while ambulating but is motivated and has strong family support at home.  She ambulated 60 ft with use of RW and min guard assist.  Pt will benefit from skilled PT to increase their independence and safety with mobility to allow discharge to the venue listed below.       Follow Up Recommendations Home health PT;Supervision for mobility/OOB    Equipment Recommendations  None recommended by PT    Recommendations for Other Services OT consult     Precautions / Restrictions Precautions Precautions: Fall Restrictions Weight Bearing Restrictions: No      Mobility  Bed Mobility Overal bed mobility: Modified Independent Bed Mobility: Supine to Sit     Supine to sit: Supervision     General bed mobility comments: Increased effort and time with use of bed rails and HOB elevated.  Supervision for safety.  Transfers Overall transfer level: Needs assistance Equipment used: Rolling walker (2 wheeled) Transfers: Sit to/from Stand Sit to Stand: Min assist         General transfer comment: Min assist to boost to standing due to BLE pain.  Cues for hand placement and technique.  Ambulation/Gait Ambulation/Gait assistance: Min guard Ambulation Distance (Feet): 60 Feet Assistive device: Rolling walker (2 wheeled) Gait Pattern/deviations: Decreased stride length;Shuffle;Trunk flexed Gait velocity: decreased   General  Gait Details: Cues for upright posture and forward gaze.  Pt shuffles feet and attempts to perform proper foot clearance with verbal cues; however, is limited due to pain.   Stairs            Wheelchair Mobility    Modified Rankin (Stroke Patients Only)       Balance Overall balance assessment: Needs assistance Sitting-balance support: No upper extremity supported;Feet supported Sitting balance-Leahy Scale: Good     Standing balance support: Bilateral upper extremity supported;During functional activity Standing balance-Leahy Scale: Poor Standing balance comment: Relies on support from RW                             Pertinent Vitals/Pain Pain Assessment: 0-10 Pain Score: 10-Worst pain ever Pain Location: BLE while ambulating Pain Descriptors / Indicators: Aching;Tightness Pain Intervention(s): Limited activity within patient's tolerance;Monitored during session;Repositioned;Relaxation    Home Living Family/patient expects to be discharged to:: Private residence Living Arrangements: Spouse/significant other;Children Available Help at Discharge: Family;Available 24 hours/day Type of Home: House Home Access: Ramped entrance     Home Layout: Two level;Able to live on main level with bedroom/bathroom Home Equipment: Walker - 4 wheels;Walker - 2 wheels;Cane - quad;Grab bars - tub/shower (rollator)      Prior Function Level of Independence: Needs assistance   Gait / Transfers Assistance Needed: Husband assists with shower transfer.    ADL's / Homemaking Assistance Needed: Pt requires assist from husband to dress.  Pt does the cooking and cleaning.  Pt does not drive.  Comments: Pt has a button  she can push that will activate the doorbell if she needs help to alert her husband. She reports 2 falls in the past 6 months.  One fall was due to tripping over her rollator.     Hand Dominance   Dominant Hand: Right    Extremity/Trunk Assessment   Upper  Extremity Assessment: Generalized weakness           Lower Extremity Assessment: Generalized weakness;RLE deficits/detail;LLE deficits/detail RLE Deficits / Details: RLE lesions from cellulitis  LLE Deficits / Details: pre-existing venous insufficiency     Communication   Communication: HOH  Cognition Arousal/Alertness: Awake/alert Behavior During Therapy: WFL for tasks assessed/performed Overall Cognitive Status: Within Functional Limits for tasks assessed                      General Comments      Exercises General Exercises - Lower Extremity Ankle Circles/Pumps: AROM;Both;20 reps;Supine Straight Leg Raises: AAROM;Both;5 reps;Supine   Assessment/Plan    PT Assessment Patient needs continued PT services  PT Problem List Decreased strength;Decreased range of motion;Decreased activity tolerance;Decreased balance;Decreased mobility;Decreased knowledge of use of DME;Decreased safety awareness;Pain;Decreased skin integrity          PT Treatment Interventions DME instruction;Gait training;Functional mobility training;Therapeutic activities;Balance training;Therapeutic exercise;Patient/family education    PT Goals (Current goals can be found in the Care Plan section)  Acute Rehab PT Goals Patient Stated Goal: to go home, decreased pain PT Goal Formulation: With patient/family Time For Goal Achievement: 03/09/16 Potential to Achieve Goals: Good    Frequency Min 2X/week   Barriers to discharge        Co-evaluation               End of Session Equipment Utilized During Treatment: Gait belt Activity Tolerance: Patient limited by pain Patient left: in chair;with call bell/phone within reach;with chair alarm set;with family/visitor present;with nursing/sitter in room Nurse Communication: Mobility status         Time: [id number] PT Time Calculation (min) (ACUTE ONLY): 33 min   Charges:   PT Evaluation $PT Eval Low Complexity: 1 Procedure PT  Treatments $Gait Training: 8-22 mins   PT G CodesCollie Siad PT, DPT 03/02/2016, 11:23 AM

## 2016-03-02 NOTE — Care Management (Signed)
Patient admitted from home with Cellulitis failed outpatient treatment.  Patient lives at home with her husband who provides transportation when needed.  PCP Walker.  Patient denies any issues obtaining her medications.  PT has evaluated patient and recommended home health PT.  Patient states that she has had services in the home prior, but is not able to recall the agency.  Patient was provided with agency preference, and [facility] was selected.  [facility] from [facility] notified of order and plan to discharge today.  Patient states that she has a RW and cane in the home for ambulation should she need it.  RNCM signing off.

## 2016-03-02 NOTE — Care Management Important Message (Signed)
Important Message  Patient Details  Name: Lindsey English MRN: [id number] Date of Birth: Jul 25, 1929   Medicare Important Message Given:  N/A - LOS <3 / Initial given by admissions    Beverly Sessions, RN 03/02/2016, 1:50 PM

## 2016-03-02 NOTE — Discharge Summary (Signed)
Lindsey English NAME: Lindsey English    MR#:  [id number]  DATE OF BIRTH:  11/05/1929  DATE OF ADMISSION:  02/29/2016 ADMITTING PHYSICIAN: Dustin Flock, MD  DATE OF DISCHARGE: 03/02/16  PRIMARY CARE PHYSICIAN: Madelyn Brunner, MD    ADMISSION DIAGNOSIS:  Cellulitis of right lower extremity [L03.115] Allergic reaction, initial encounter [T78.40XA]  DISCHARGE DIAGNOSIS:  Active Problems:   Cellulitis allergic reaction to keflex  SECONDARY DIAGNOSIS:   Past Medical History:  Diagnosis Date  . Asthma   . Cancer ([place])    Right  . Diabetes mellitus without complication ([place])   . DVT (deep venous thrombosis) ([place])   . Hiatal hernia   . Hyperlipemia   . Hypertension   . Hyperthyroidism     HOSPITAL COURSE:  Lindsey English  is a 82 y.o. female admitted 02/29/2016 with chief complaint Allergic Reaction . Please see H&P performed by Dustin Flock, MD for further information. Patient presented with full body rash after taking keflex for cellulitis. Given steroids, improved, cellulitis has improved but still requires further antibiotics. Eval by wound therapy who made the below recommendations   DISCHARGE CONDITIONS:   stable  CONSULTS OBTAINED:    DRUG ALLERGIES:   Allergies  Allergen Reactions  . Shrimp [Shellfish Allergy]   . Sulfa Antibiotics Nausea Only  . Keflex [Cephalexin] Rash    DISCHARGE MEDICATIONS:   Current Discharge Medication List    START taking these medications   Details  clindamycin (CLEOCIN) 300 MG capsule Take 1 capsule (300 mg total) by mouth 3 (three) times daily. Qty: 15 capsule, Refills: 0    oxyCODONE (OXY IR/ROXICODONE) 5 MG immediate release tablet Take 1 tablet (5 mg total) by mouth every 4 (four) hours as needed for moderate pain. Qty: 30 tablet, Refills: 0      CONTINUE these medications which have NOT CHANGED   Details  acetaminophen (TYLENOL) 500 MG tablet Take 500 mg by mouth  every 4 (four) hours as needed for mild pain.     albuterol (PROVENTIL HFA;VENTOLIN HFA) 108 (90 Base) MCG/ACT inhaler Inhale 2 puffs into the lungs every 4 (four) hours as needed for wheezing.    alendronate (FOSAMAX) 70 MG tablet Take 70 mg by mouth every Wednesday.    amLODipine (NORVASC) 5 MG tablet Take 5 mg by mouth daily.    aspirin EC 81 MG tablet Take 81 mg by mouth daily.    bisacodyl (DULCOLAX) 10 MG suppository Place 10 mg rectally daily as needed for moderate constipation.     Calcium Carbonate-Vitamin D (CALCIUM 600+D) 600-200 MG-UNIT TABS Take 1 tablet by mouth 2 (two) times daily.    cetirizine (ZYRTEC) 10 MG tablet Take 10 mg by mouth daily.    cloNIDine (CATAPRES) 0.1 MG tablet Take 0.1 mg by mouth 2 (two) times daily.    exemestane (AROMASIN) 25 MG tablet Take 25 mg by mouth daily.    ferrous [id number] C-folic acid (TRINSICON / FOLTRIN) capsule Take 1 capsule by mouth daily before breakfast.    furosemide (LASIX) 40 MG tablet Take 40 mg by mouth daily.    gabapentin (NEURONTIN) 300 MG capsule Take 300 mg by mouth 3 (three) times daily.    metaxalone (SKELAXIN) 800 MG tablet Take 800 mg by mouth 3 (three) times daily as needed for muscle spasms.     Multiple Vitamins-Minerals (CENTRUM SILVER PO) Take 1 tablet by mouth daily.    potassium chloride SA (  K-DUR,KLOR-CON) 20 MEQ tablet Take 40 mEq by mouth 3 (three) times daily.    simvastatin (ZOCOR) 10 MG tablet Take 10 mg by mouth at bedtime.    tiotropium (SPIRIVA) 18 MCG inhalation capsule Place 18 mcg into inhaler and inhale daily.      STOP taking these medications     cephALEXin (KEFLEX) 500 MG capsule          DISCHARGE INSTRUCTIONS:  Cleanse bilateral lower legs with soap and water.  Apply Amlactin cream twice daily.   DIET:  Regular diet  DISCHARGE CONDITION:  Stable  ACTIVITY:  Activity as tolerated  OXYGEN:  Home Oxygen: No.   Oxygen Delivery: room air  DISCHARGE  LOCATION:  home   If you experience worsening of your admission symptoms, develop shortness of breath, life threatening emergency, suicidal or homicidal thoughts you must seek medical attention immediately by calling 911 or calling your MD immediately  if symptoms less severe.  You Must read complete instructions/literature along with all the possible adverse reactions/side effects for all the Medicines you take and that have been prescribed to you. Take any new Medicines after you have completely understood and accpet all the possible adverse reactions/side effects.   Please note  You were cared for by a hospitalist during your hospital stay. If you have any questions about your discharge medications or the care you received while you were in the hospital after you are discharged, you can call the unit and asked to speak with the hospitalist on call if the hospitalist that took care of you is not available. Once you are discharged, your primary care physician will handle any further medical issues. Please note that NO REFILLS for any discharge medications will be authorized once you are discharged, as it is imperative that you return to your primary care physician (or establish a relationship with a primary care physician if you do not have one) for your aftercare needs so that they can reassess your need for medications and monitor your lab values.    On the day of Discharge:   VITAL SIGNS:  Blood pressure (!) 139/54, pulse 62, temperature 97.7 F (36.5 C), temperature source Oral, resp. rate 20, height 5' (1.524 m), weight 55.8 kg (123 lb), SpO2 91 %.  I/O:   Intake/Output Summary (Last 24 hours) at 03/02/16 1055 Last data filed at 03/02/16 1039  Gross per 24 hour  Intake              600 ml  Output              900 ml  Net             -300 ml    PHYSICAL EXAMINATION:  GENERAL:  82 y.o.-year-old patient lying in the bed with no acute distress.  EYES: Pupils equal, round, reactive  to light and accommodation. No scleral icterus. Extraocular muscles intact.  HEENT: Head atraumatic, normocephalic. Oropharynx and nasopharynx clear.  NECK:  Supple, no jugular venous distention. No thyroid enlargement, no tenderness.  LUNGS: Normal breath sounds bilaterally, no wheezing, rales,rhonchi or crepitation. No use of accessory muscles of respiration.  CARDIOVASCULAR: S1, S2 normal. No murmurs, rubs, or gallops.  ABDOMEN: Soft, non-tender, non-distended. Bowel sounds present. No organomegaly or mass.  EXTREMITIES: No pedal edema, cyanosis, or clubbing.  NEUROLOGIC: Cranial nerves II through XII are intact. Muscle strength 5/5 in all extremities. Sensation intact. Gait not checked.  PSYCHIATRIC: The patient is alert and oriented x 3.  SKIN: bilateral erythema improved, right leg lesion,  No other obvious rash, lesion, or ulcer.   DATA REVIEW:   CBC  Recent Labs Lab 03/01/16 0523  WBC 5.3  HGB 12.0  HCT 35.2  PLT 204    Chemistries   Recent Labs Lab 03/01/16 0523  NA 144  K 4.1  CL 108  CO2 30  GLUCOSE 94  BUN 8  CREATININE 0.55  CALCIUM 8.8*    Cardiac Enzymes No results for input(s): TROPONINI in the last 168 hours.  Microbiology Results  Results for orders placed or performed during the hospital encounter of 02/29/16  Culture, blood (routine x 2)     Status: None (Preliminary result)   Collection Time: 02/29/16  9:41 AM  Result Value Ref Range Status   Specimen Description BLOOD RIGHT ARM  Final   Special Requests   Final    BOTTLES DRAWN AEROBIC AND ANAEROBIC AER 10ML ANA 9ML   Culture NO GROWTH 2 DAYS  Final   Report Status PENDING  Incomplete  Culture, blood (routine x 2)     Status: None (Preliminary result)   Collection Time: 02/29/16  9:41 AM  Result Value Ref Range Status   Specimen Description BLOOD LEFT ARM  Final   Special Requests   Final    BOTTLES DRAWN AEROBIC AND ANAEROBIC AER 15ML ANA 14ML   Culture NO GROWTH 2 DAYS  Final   Report  Status PENDING  Incomplete    RADIOLOGY:  No results found.   Management plans discussed with the patient, family and they are in agreement.  CODE STATUS:     Code Status Orders        Start     Ordered   02/29/16 1106  Full code  Continuous     02/29/16 1107    Code Status History    Date Active Date Inactive Code Status Order ID Comments User Context   This patient has a current code status but no historical code status.    Advance Directive Documentation   [place] Most Recent Value  Type of Advance Directive  Living will  Pre-existing out of facility DNR order (yellow form or pink MOST form)  No data  "MOST" Form in Place?  No data      TOTAL TIME TAKING CARE OF THIS PATIENT: 33 minutes.    Emmitte Surgeon,  Karenann Cai.D on 03/02/2016 at 10:55 AM  Between 7am to 6pm - Pager - [phone]  After 6pm go to [email or web address] - Proofreader  [facility]  Office  [phone]  CC: Primary care physician; Madelyn Brunner, MD

## 2016-03-05 LAB — CULTURE, BLOOD (ROUTINE X 2)
CULTURE: NO GROWTH
Culture: NO GROWTH

## 2016-03-28 ENCOUNTER — Encounter: Payer: Medicare Other | Attending: Surgery | Admitting: Surgery

## 2016-03-28 DIAGNOSIS — B369 Superficial mycosis, unspecified: Secondary | ICD-10-CM | POA: Diagnosis not present

## 2016-03-28 DIAGNOSIS — Z86718 Personal history of other venous thrombosis and embolism: Secondary | ICD-10-CM | POA: Insufficient documentation

## 2016-03-28 DIAGNOSIS — E785 Hyperlipidemia, unspecified: Secondary | ICD-10-CM | POA: Diagnosis not present

## 2016-03-28 DIAGNOSIS — Z853 Personal history of malignant neoplasm of breast: Secondary | ICD-10-CM | POA: Insufficient documentation

## 2016-03-28 DIAGNOSIS — J45909 Unspecified asthma, uncomplicated: Secondary | ICD-10-CM | POA: Insufficient documentation

## 2016-03-28 DIAGNOSIS — K449 Diaphragmatic hernia without obstruction or gangrene: Secondary | ICD-10-CM | POA: Diagnosis not present

## 2016-03-28 DIAGNOSIS — I10 Essential (primary) hypertension: Secondary | ICD-10-CM | POA: Diagnosis not present

## 2016-03-28 DIAGNOSIS — Z7982 Long term (current) use of aspirin: Secondary | ICD-10-CM | POA: Diagnosis not present

## 2016-03-28 DIAGNOSIS — L97211 Non-pressure chronic ulcer of right calf limited to breakdown of skin: Secondary | ICD-10-CM | POA: Diagnosis not present

## 2016-03-28 DIAGNOSIS — Z79899 Other long term (current) drug therapy: Secondary | ICD-10-CM | POA: Diagnosis not present

## 2016-03-28 DIAGNOSIS — Z87891 Personal history of nicotine dependence: Secondary | ICD-10-CM | POA: Insufficient documentation

## 2016-03-28 DIAGNOSIS — E11622 Type 2 diabetes mellitus with other skin ulcer: Secondary | ICD-10-CM | POA: Diagnosis not present

## 2016-03-28 NOTE — Progress Notes (Addendum)
ORQUIDEA, WHEELAND ([id number]) Visit Report for 03/28/2016 Chief Complaint Document Details Patient Name: Lindsey English, Lindsey English. Date of Service: 03/28/2016 8:00 AM Medical Record Number: [id number] Patient Account Number: [id number] Date of Birth/Sex: 1929-06-23 (82 y.o. Female) Treating RN: Montey Hora Primary Care Physician: Lynett Fish Other Clinician: Referring Physician: Mortimer Fries Treating Physician/Extender: Frann Rider in Treatment: 0 Information Obtained from: Patient Chief Complaint Patients presents for treatment of an open diabetic ulcer with swelling of the right lower extremity which she's had for about a month Electronic Signature(s) Signed: 03/28/2016 9:16:40 AM By: Christin Fudge MD, FACS Entered By: Christin Fudge on 03/28/2016 09:16:40 Lindsey English ([id number]) -------------------------------------------------------------------------------- HPI Details Patient Name: Lindsey English, Lindsey English. Date of Service: 03/28/2016 8:00 AM Medical Record Number: [id number] Patient Account Number: [id number] Date of Birth/Sex: March 30, 1930 (82 y.o. Female) Treating RN: Montey Hora Primary Care Physician: Lynett Fish Other Clinician: Referring Physician: Mortimer Fries Treating Physician/Extender: Frann Rider in Treatment: 0 History of Present Illness Location: redness, swelling and pain of the right lower extremity Quality: Patient reports experiencing heaviness to affected area(s). Severity: Patient states wound are getting worse. Duration: Patient has had the wound for < 4 weeks prior to presenting for treatment Timing: Pain in wound is Intermittent (comes and goes Context: The wound appeared gradually over time Modifying Factors: Other treatment(s) tried include: admitted to the hospital for IV antibiotics and symptomatic treatment Associated Signs and Symptoms: Patient reports having increase swelling. HPI Description: 82 year old patient sent to as by her  PCP Herbie [place] to me the physician assistant from the [facility] at [facility]. She has been complaining of leg pain and has a wound on her right lower leg which was diagnosed as a venous stasis ulcer. Could not tolerate and Unna's boots because of pain. Last medical history significant for diabetes mellitus, asthma, DVT, hiatal hernia, hypertension, a medical hernia, cardiac catheterization, hysterectomy, breast biopsy. on 01/04/2016 right lower extremity DVT study was done and it was negative for a DVT. The patient was found to have cellulitis of the right lower extremity and was given ceftriaxone injection in the office and put on Keflex 3 times a day and sent home with this on 02/24/16. The patient was recently admitted to hospital between October 9 and October 11 of 2017 for cellulitis of the right lower extremity with possible allergic reaction to Keflex. she was treated appropriately inpatient with vancomycin and Levaquin and then discharged home onclindamycin and other supportive care. the patient's husband says that she was seen by Dr. dew a while ago and some tests were done but no procedures were done. Electronic Signature(s) Signed: 03/28/2016 9:17:45 AM By: Christin Fudge MD, FACS Previous Signature: 03/28/2016 8:32:19 AM Version By: Christin Fudge MD, FACS Previous Signature: 03/28/2016 8:16:33 AM Version By: Christin Fudge MD, FACS Entered By: Christin Fudge on 03/28/2016 09:17:45 Lindsey English, Lindsey English ([id number]) -------------------------------------------------------------------------------- Physical Exam Details Patient Name: Lindsey English, Lindsey English. Date of Service: 03/28/2016 8:00 AM Medical Record Number: [id number] Patient Account Number: [id number] Date of Birth/Sex: Oct 03, 1929 (82 y.o. Female) Treating RN: Montey Hora Primary Care Physician: Lynett Fish Other Clinician: Referring Physician: Mortimer Fries Treating Physician/Extender: Frann Rider in Treatment:  0 Constitutional . Pulse regular. Respirations normal and unlabored. Afebrile. . Eyes Nonicteric. Reactive to light. Ears, Nose, Mouth, and Throat Lips, teeth, and gums WNL.Marland Kitchen Moist mucosa without lesions. Neck supple and nontender. No palpable supraclavicular or cervical adenopathy. Normal sized without goiter. Respiratory WNL. No retractions.. Breath sounds WNL, No rubs,  rales, rhonchi, or wheeze.. Cardiovascular Heart rhythm and rate regular, no murmur or gallop.. Pedal Pulses WNL. ABIs were noncompressible. she has got minimal brawny lymphedema of the right lower extremity. Gastrointestinal (GI) Abdomen without masses or tenderness.. No liver or spleen enlargement or tenderness.. Lymphatic No adneopathy. No adenopathy. No adenopathy. Musculoskeletal Adexa without tenderness or enlargement.. Digits and nails w/o clubbing, cyanosis, infection, petechiae, ischemia, or inflammatory conditions.. Integumentary (Hair, Skin) No suspicious lesions. No crepitus or fluctuance. No peri-wound warmth or erythema. No masses.Marland Kitchen Psychiatric Judgement and insight Intact.. No evidence of depression, anxiety, or agitation.. Notes the patient has fungal infection on her right foot and lower extremity and some superficial ulcerations with residual cellulitis. The lymphedema is not very significant but a bit brawny. She has some superficial eschar and exudate which was washed out with saline and no sharp debridement was required today. Electronic Signature(s) Signed: 03/28/2016 9:18:47 AM By: Christin Fudge MD, FACS Entered By: Christin Fudge on 03/28/2016 09:18:46 Lindsey English ([id number]) -------------------------------------------------------------------------------- Physician Orders Details Patient Name: Lindsey English, TENG. Date of Service: 03/28/2016 8:00 AM Medical Record Number: [id number] Patient Account Number: [id number] Date of Birth/Sex: 1929/06/19 (82 y.o. Female) Treating RN: Montey Hora Primary Care Physician: Lynett Fish Other Clinician: Referring Physician: Mortimer Fries Treating Physician/Extender: Frann Rider in Treatment: 0 Verbal / Phone Orders: Yes Clinician: Montey Hora Read Back and Verified: Yes Diagnosis Coding Wound Cleansing Wound #1 Right,Medial Lower Leg o Clean wound with Normal Saline. o May Shower, gently pat wound dry prior to applying new dressing. Primary Wound Dressing Wound #1 Right,Medial Lower Leg o Sorbalgon Ag - HHRN may use calcium alginate with silver o Other: - antifungal cream to right lower leg Secondary Dressing Wound #1 Right,Medial Lower Leg o Gauze, ABD and Kerlix/Conform - secure with netting and tape - no coban Dressing Change Frequency Wound #1 Right,Medial Lower Leg o Change dressing every other day. Follow-up Appointments Wound #1 Right,Medial Lower Leg o Return Appointment in 1 week. Edema Control Wound #1 Right,Medial Lower Leg o Elevate legs to the level of the heart and pump ankles as often as possible Home Health Wound #1 Right,Medial Lower Leg o [place] Visits [facility] please call [place] at [phone] and inform us of the correct [facility] agency so signed wound care orders can be faxed over [facility] Nurse may visit PRN to address patientos wound care needs. o FACE TO FACE ENCOUNTER: MEDICARE and MEDICAID PATIENTS: I certify that this patient is under my care and that I had a face-to-face encounter that meets the physician face-to-face encounter requirements with this patient on this date. The encounter with the patient was in whole or in part for the following MEDICAL CONDITION: (primary reason for [place]) Lindsey English, Lindsey English ([id number]) MEDICAL NECESSITY: I certify, that based on my findings, NURSING services are a medically necessary home health service. HOME BOUND STATUS: I certify that my clinical findings support that this  patient is homebound (i.e., Due to illness or injury, pt requires aid of supportive devices such as crutches, cane, wheelchairs, walkers, the use of special transportation or the assistance of another person to leave their place of residence. There is a normal inability to leave the home and doing so requires considerable and taxing effort. Other absences are for medical reasons / religious services and are infrequent or of short duration when for other reasons). o If current dressing causes regression in wound condition, may D/C ordered  dressing product/s and apply Normal Saline Moist Dressing daily until next [place] / Other MD appointment. [place] of regression in wound condition at [phone]. o Please direct any NON-WOUND related issues/requests for orders to patient's Primary Care Physician Medications-please add to medication list. o Other: - antifungal cream to right lower leg Patient Medications Allergies: Shellfish Containing Products, Sulfa (Sulfonamide Antibiotics), Keflex Notifications Medication Indication Start End Lotrisone 03/28/2016 DOSE topical 1 %-0.05 % cream - cream topical daily as directed Electronic Signature(s) Signed: 03/28/2016 9:14:26 AM By: Christin Fudge MD, FACS Entered By: Christin Fudge on 03/28/2016 09:14:25 Lindsey English ([id number]) -------------------------------------------------------------------------------- Problem List Details Patient Name: Lindsey English, HEINLY. Date of Service: 03/28/2016 8:00 AM Medical Record Number: [id number] Patient Account Number: [id number] Date of Birth/Sex: 01-05-1930 (82 y.o. 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 Date of Service: 03/28/2016 8:00 AM Medical Record Number: [id number] Patient Account Number: [id number] Date of Birth/Sex: 1930/01/28 (82 y.o. Female) Treating RN: Montey Hora Primary Care Physician: Lynett Fish Other Clinician: Referring Physician: Mortimer Fries Treating Physician/Extender: Frann Rider in Treatment: 0 Subjective Chief Complaint Information obtained from Patient Patients presents for treatment of an open diabetic ulcer with swelling of the right lower extremity which she's had for about a month History of Present Illness (HPI) The following HPI elements were documented for the patient's wound: Location: redness, swelling and pain of the right lower extremity Quality: Patient reports experiencing heaviness to affected area(s). Severity: Patient states wound are getting worse. Duration: Patient has had the wound for < 4 weeks prior to presenting for treatment Timing: Pain in wound is Intermittent (comes and goes Context: The wound appeared gradually over time Modifying Factors: Other treatment(s) tried include: admitted to the hospital for IV antibiotics and symptomatic treatment Associated Signs and Symptoms: Patient reports having increase swelling. 82 year old patient sent to as by her PCP Herbie [place] to me the physician assistant from the practice at the [facility] clinic. She has been complaining of leg pain and has a wound on her right lower leg which  was diagnosed as a venous stasis ulcer. Could not tolerate and Unna's boots because of pain. Last medical history significant for diabetes mellitus, asthma, DVT, hiatal hernia, hypertension, a medical hernia, cardiac catheterization, hysterectomy, breast biopsy. on 01/04/2016 right lower extremity DVT study was done and it was negative for a DVT. The patient was found to have cellulitis of the right lower extremity and was given ceftriaxone injection in the office and put on Keflex 3 times a day and sent home with this on 02/24/16. The patient was recently admitted to hospital between October 9 and October 11 of 2017 for cellulitis of the right lower extremity with possible allergic reaction to Keflex. she was treated appropriately inpatient with vancomycin and Levaquin and then discharged home onclindamycin and other supportive care. the patient's husband says that she was seen by Dr. dew a while ago and some tests were done  but no procedures were done. Wound History Patient presents with 1 open wound. Laboratory tests have been performed in the last month. Patient reportedly has tested positive for an antibiotic resistant organism. Patient History Information obtained from Patient, Caregiver. Lindsey English, Lindsey English ([id number]) Allergies Shellfish Containing Products, Sulfa (Sulfonamide Antibiotics), Keflex Social History Former smoker - quit 25 years ago or longer, Marital Status - Married, Alcohol Use - Never, Drug Use - No History, Caffeine Use - Daily. Medical History Eyes Patient has history of Cataracts - removed Respiratory Patient has history of Asthma Cardiovascular Patient has history of Deep Vein Thrombosis, Hypertension Endocrine Patient has history of Type II Diabetes Oncologic Denies history of Received Chemotherapy, Received Radiation Patient is treated with Controlled Diet. Medical And Surgical History  Notes Ear/Nose/Mouth/Throat HOH Cardiovascular hyperlipidemia Oncologic breast cancer - right breast - lumpectomy, chemo Review of Systems (ROS) Constitutional Symptoms (General Health) The patient has no complaints or symptoms. Eyes The patient has no complaints or symptoms. Ear/Nose/Mouth/Throat The patient has no complaints or symptoms. Hematologic/Lymphatic The patient has no complaints or symptoms. Respiratory The patient has no complaints or symptoms. Cardiovascular Complains or has symptoms of LE edema. Gastrointestinal The patient has no complaints or symptoms. Endocrine Complains or has symptoms of Thyroid disease. Genitourinary The patient has no complaints or symptoms. Immunological The patient has no complaints or symptoms. Lindsey English, Lindsey English ([id number]) Integumentary (Skin) The patient has no complaints or symptoms. Musculoskeletal The patient has no complaints or symptoms. Neurologic The patient has no complaints or symptoms. Oncologic The patient has no complaints or symptoms. Psychiatric The patient has no complaints or symptoms. Medications Foltrin Acetaminophen Extra Strength 500 mg tablet oral 1 1 tablet oral Spiriva with HandiHaler 18 mcg and inhalation capsules inhalation capsule, w/inhalation device inhalation gabapentin 300 mg capsule oral capsule oral cetirizine 10 mg tablet oral tablet oral simvastatin 10 mg tablet oral tablet oral clonidine HCl 0.1 mg tablet oral tablet oral exemestane 25 mg tablet oral tablet oral ProAir HFA 90 mcg/actuation aerosol inhaler inhalation HFA aerosol inhaler inhalation alendronate 70 mg tablet oral 1 1 tablet oral amlodipine 5 mg tablet oral 1 1 tablet oral Caltrate 600 + D 600 mg (1,500 mg)-800 unit chewable tablet oral tablet,chewable oral Centrum Silver 400 mcg-250 mcg chewable tablet oral tablet,chewable oral bisacodyl 10 mg rectal suppository rectal suppository rectal furosemide 40 mg tablet oral tablet  oral aspirin 81 mg chewable tablet oral 1 1 tablet,chewable oral potassium chloride ER 20 mEq tablet,extended release oral 2 2 tablet extended release oral three times daily metaxalone 800 mg tablet oral tablet oral Lotrisone 1 %-0.05 % topical cream topical cream topical daily as directed Objective Constitutional Pulse regular. Respirations normal and unlabored. Afebrile. Vitals Time Taken: 8:10 AM, Height: 60 in, Source: Stated, Weight: 131 lbs, Source: Stated, BMI: 25.6, Temperature: 97.7 F, Pulse: 74 bpm, Respiratory Rate: 18 breaths/min, Blood Pressure: 190/72 mmHg. Lindsey English, Lindsey English ([id number]) Eyes Nonicteric. Reactive to light. Ears, Nose, Mouth, and Throat Lips, teeth, and gums WNL.Marland Kitchen Moist mucosa without lesions. Neck supple and nontender. No palpable supraclavicular or cervical adenopathy. Normal sized without goiter. Respiratory WNL. No retractions.. Breath sounds WNL, No rubs, rales, rhonchi, or wheeze.. Cardiovascular Heart rhythm and rate regular, no murmur or gallop.. Pedal Pulses WNL. ABIs were noncompressible. she has got minimal brawny lymphedema of the right lower extremity. Gastrointestinal (GI) Abdomen without masses or tenderness.. No liver or spleen enlargement or tenderness.. Lymphatic No adneopathy. No adenopathy. No adenopathy. Musculoskeletal Adexa without tenderness or enlargement.. Digits and  nails w/o clubbing, cyanosis, infection, petechiae, ischemia, or inflammatory conditions.Marland Kitchen Psychiatric Judgement and insight Intact.. No evidence of depression, anxiety, or agitation.. General Notes: the patient has fungal infection on her right foot and lower extremity and some superficial ulcerations with residual cellulitis. The lymphedema is not very significant but a bit brawny. She has some superficial eschar and exudate which was washed out with saline and no sharp debridement was required today. Integumentary (Hair, Skin) No suspicious lesions. No  crepitus or fluctuance. No peri-wound warmth or erythema. No masses.. Wound #1 status is Open. Original cause of wound was Gradually Appeared. The wound is located on the Right,Medial Lower Leg. The wound measures 2.5cm length x 3.5cm width x 0.2cm depth; 6.872cm^2 area and 1.374cm^3 volume. The wound is limited to skin breakdown. There is no tunneling or undermining noted. There is a large amount of serous drainage noted. The wound margin is flat and intact. There is large (67-100%) red, pink granulation within the wound bed. There is no necrotic tissue within the wound bed. The periwound skin appearance exhibited: Moist. The periwound skin appearance did not exhibit: Callus, Crepitus, Excoriation, Fluctuance, Friable, Induration, Localized Edema, Rash, Scarring, Dry/Scaly, Maceration, Atrophie Blanche, Cyanosis, Ecchymosis, Hemosiderin Staining, Mottled, Pallor, Rubor, Erythema. Periwound temperature was noted as No Abnormality. The periwound has tenderness on palpation. Lindsey English, Lindsey English ([id number]) Assessment Active Problems ICD-10 E11.622 - Type 2 diabetes mellitus with other skin ulcer L97.211 - Non-pressure chronic ulcer of right calf limited to breakdown of skin B36.9 - Superficial mycosis, unspecified this 82 year old patient who has diabetes mellitus and has had some peripheral vascular disease in the past has had a recent cellulitis of the right lower extremity which was treated appropriately. After review I have recommended: 1. Application of Lotrisone ointment on the areas which have florid fungal infection 2. Silver alginate and a light dressing with Kerlix 3. Elevation and exercise 4. Review by the vascular surgeons for possible venous reflux studies an arterial duplex studies 5. we'll see her back next week with review of her previous vascular notes. Plan Wound Cleansing: Wound #1 Right,Medial Lower Leg: Clean wound with Normal Saline. May Shower, gently pat wound dry  prior to applying new dressing. Primary Wound Dressing: Wound #1 Right,Medial Lower Leg: Sorbalgon Ag - HHRN may use calcium alginate with silver Other: - antifungal cream to right lower leg Secondary Dressing: Wound #1 Right,Medial Lower Leg: Gauze, ABD and Kerlix/Conform - secure with netting and tape - no coban Dressing Change Frequency: Wound #1 Right,Medial Lower Leg: Change dressing every other day. Follow-up Appointments: Wound #1 Right,Medial Lower Leg: Return Appointment in 1 week. Edema Control: Wound #1 Right,Medial Lower Leg: Elevate legs to the level of the heart and pump ankles as often as possible Home Health: Wound #1 Right,Medial Lower Leg: Lindsey English, RACETTE ([id number]) [place] Visits - [facility] please call [place] at [phone] and inform us of the correct [facility] agency so signed wound care orders can be faxed over [place] Nurse may visit PRN to address patient s wound care needs. FACE TO FACE ENCOUNTER: MEDICARE and MEDICAID PATIENTS: I certify that this patient is under my care and that I had a face-to-face encounter that meets the physician face-to-face encounter requirements with this patient on this date. The encounter with the patient was in whole or in part for the following MEDICAL CONDITION: (primary reason for [place]) MEDICAL NECESSITY: I certify, that based on my findings, NURSING services are a medically  necessary home health service. HOME BOUND STATUS: I certify that my clinical findings support that this patient is homebound (i.e., Due to illness or injury, pt requires aid of supportive devices such as crutches, cane, wheelchairs, walkers, the use of special transportation or the assistance of another person to leave their place of residence. There is a normal inability to leave the home and doing so requires considerable and taxing effort. Other absences are for medical reasons / religious services and are  infrequent or of short duration when for other reasons). If current dressing causes regression in wound condition, may D/C ordered dressing product/s and apply Normal Saline Moist Dressing daily until next [place] / Other MD appointment. [place] of regression in wound condition at [phone]. Please direct any NON-WOUND related issues/requests for orders to patient's Primary Care Physician Medications-please add to medication list.: Other: - antifungal cream to right lower leg The following medication(s) was prescribed: Lotrisone topical 1 %-0.05 % cream cream topical daily as directed starting 03/28/2016 this 82 year old patient who has diabetes mellitus and has had some peripheral vascular disease in the past has had a recent cellulitis of the right lower extremity which was treated appropriately. After review I have recommended: 1. Application of Lotrisone ointment on the areas which have florid fungal infection 2. Silver alginate and a light dressing with Kerlix 3. Elevation and exercise 4. Review by the vascular surgeons for possible venous reflux studies an arterial duplex studies 5. we'll see her back next week with review of her previous vascular notes. Electronic Signature(s) Signed: 03/28/2016 9:21:09 AM By: Christin Fudge MD, FACS Entered By: Christin Fudge on 03/28/2016 09:21:09 Lindsey English ([id number]) -------------------------------------------------------------------------------- ROS/PFSH Details Patient Name: LYNNLEE, KAGARISE. Date of Service: 03/28/2016 8:00 AM Medical Record Number: [id number] Patient Account Number: [id number] Date of Birth/Sex: 1930-01-10 (82 y.o. Female) Treating RN: Montey Hora Primary Care Physician: Lynett Fish Other Clinician: Referring Physician: Mortimer Fries Treating Physician/Extender: Frann Rider in Treatment: 0 Information Obtained From Patient Caregiver Wound History Do you currently  have one or more open woundso Yes How many open wounds do you currently haveo 1 Cardiovascular Complaints and Symptoms: Positive for: LE edema Medical History: Positive for: Deep Vein Thrombosis; Hypertension Past Medical History Notes: hyperlipidemia Endocrine Complaints and Symptoms: Positive for: Thyroid disease Medical History: Positive for: Type II Diabetes Treated with: Diet Constitutional Symptoms (General Health) Complaints and Symptoms: No Complaints or Symptoms Eyes Complaints and Symptoms: No Complaints or Symptoms Medical History: Positive for: Cataracts - removed Ear/Nose/Mouth/Throat Complaints and Symptoms: No Complaints or Symptoms MARIAHLYNN, CUBBAGE ([id number]) Medical History: Past Medical History Notes: HOH Hematologic/Lymphatic Complaints and Symptoms: No Complaints or Symptoms Respiratory Complaints and Symptoms: No Complaints or Symptoms Medical History: Positive for: Asthma Gastrointestinal Complaints and Symptoms: No Complaints or Symptoms Genitourinary Complaints and Symptoms: No Complaints or Symptoms Immunological Complaints and Symptoms: No Complaints or Symptoms Integumentary (Skin) Complaints and Symptoms: No Complaints or Symptoms Musculoskeletal Complaints and Symptoms: No Complaints or Symptoms Neurologic Complaints and Symptoms: No Complaints or Symptoms Oncologic Complaints and Symptoms: No Complaints or Symptoms JULIETTE, MEZERA ([id number]) Medical History: Negative for: Received Chemotherapy; Received Radiation Past Medical History Notes: breast cancer - right breast - lumpectomy, chemo Psychiatric Complaints and Symptoms: No Complaints or Symptoms HBO Extended History Items Eyes: Cataracts Immunizations Pneumococcal Vaccine: Received Pneumococcal Vaccination: Yes Immunization Notes: up to date Family and Social History Former smoker - quit 25 years ago or longer; Marital Status - Married; Alcohol Use:  Never; Drug Use: No History; Caffeine Use: Daily; Financial Concerns: No; Food, Clothing or Shelter Needs: No; Support System Lacking: No; Transportation Concerns: No; Advanced Directives: No; Patient does not want information on Advanced Directives Physician Affirmation I have reviewed and agree with the above information. Electronic Signature(s) Signed: 03/28/2016 4:01:52 PM By: Christin Fudge MD, FACS Signed: 03/28/2016 4:42:09 PM By: Montey Hora Entered By: Christin Fudge on 03/28/2016 08:32:37 Lindsey English ([id number]) -------------------------------------------------------------------------------- SuperBill Details Patient Name: ZADIA, KUNIK. Date of Service: 03/28/2016 Medical Record Number: [id number] Patient Account Number: [id number] Date of Birth/Sex: 05/18/1930 (82 y.o. 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

## 2016-03-29 NOTE — Progress Notes (Signed)
Lindsey English, Lindsey English ([id number]) Visit Report for 03/28/2016 Abuse/Suicide Risk Screen Details Patient Name: Lindsey English, Lindsey English. Date of Service: 03/28/2016 8:00 AM Medical Record Number: [id number] Patient Account Number: [id number] Date of Birth/Sex: 03/23/1930 (82 y.o. Female) Treating RN: Montey Hora Primary Care Physician: Lynett Fish Other Clinician: Referring Physician: Mortimer Fries Treating Physician/Extender: Frann Rider in Treatment: 0 Abuse/Suicide Risk Screen Items Answer ABUSE/SUICIDE RISK SCREEN: Has anyone close to you tried to hurt or harm you recentlyo No Do you feel uncomfortable with anyone in your familyo No Has anyone forced you do things that you didnot want to doo No Do you have any thoughts of harming yourselfo No Patient displays signs or symptoms of abuse and/or neglect. No Electronic Signature(s) Signed: 03/28/2016 4:42:09 PM By: Montey Hora Entered By: Montey Hora on 03/28/2016 08:12:48 Lindsey English ([id number]) -------------------------------------------------------------------------------- Activities of Daily Living Details Patient Name: Lindsey English, Lindsey English. Date of Service: 03/28/2016 8:00 AM Medical Record Number: [id number] Patient Account Number: [id number] Date of Birth/Sex: 31-Jul-1929 (82 y.o. Female) Treating RN: Montey Hora Primary Care Physician: Lynett Fish Other Clinician: Referring Physician: Mortimer Fries Treating Physician/Extender: Frann Rider in Treatment: 0 Activities of Daily Living Items Answer Activities of Daily Living (Please select one for each item) Drive Automobile Not Able Take Medications Need Assistance Use Telephone Completely Able Care for Appearance Completely Able Use Toilet Completely Able Bath / Shower Completely Able Dress Self Completely Able Feed Self Completely Able Walk Completely Able Get In / Out Bed Completely Able Housework Completely Able Prepare Meals Completely  [place] for Self Completely Able Electronic Signature(s) Signed: 03/28/2016 4:42:09 PM By: Montey Hora Entered By: Montey Hora on 03/28/2016 08:13:16 Lindsey English ([id number]) -------------------------------------------------------------------------------- Education Assessment Details Patient Name: Lindsey English Date of Service: 03/28/2016 8:00 AM Medical Record Number: [id number] Patient Account Number: [id number] Date of Birth/Sex: August 23, 1929 (82 y.o. Female) Treating RN: Montey Hora Primary Care Physician: Lynett Fish Other Clinician: Referring Physician: Mortimer Fries Treating Physician/Extender: Frann Rider in Treatment: 0 Primary Learner Assessed: Caregiver Reason Patient is not Primary Learner: wound location Learning Preferences/Education Level/Primary Language Learning Preference: Explanation, Demonstration Highest Education Level: College or Above Preferred Language: English Cognitive Barrier Assessment/Beliefs Language Barrier: No Translator Needed: No Memory Deficit: No Emotional Barrier: No Cultural/Religious Beliefs Affecting Medical No Care: Physical Barrier Assessment Impaired Vision: No Impaired Hearing: No Decreased Hand dexterity: No Knowledge/Comprehension Assessment Knowledge Level: Medium Comprehension Level: Medium Ability to understand written Medium instructions: Ability to understand verbal Medium instructions: Motivation Assessment Anxiety Level: Calm Cooperation: Cooperative Education Importance: Acknowledges Need Interest in Health Problems: Asks Questions Perception: Coherent Willingness to Engage in Self- Medium Management Activities: Readiness to Engage in Self- Medium Management Activities: Lindsey English, Lindsey English ([id number]) Electronic Signature(s) Signed: 03/28/2016 4:42:09 PM By: Montey Hora Entered By: Montey Hora on 03/28/2016 08:13:52 Lindsey English, Lindsey English  ([id number]) -------------------------------------------------------------------------------- Fall Risk Assessment Details Patient Name: Lindsey English. Date of Service: 03/28/2016 8:00 AM Medical Record Number: [id number] Patient Account Number: [id number] Date of Birth/Sex: 1929-06-20 (82 y.o. Female) Treating RN: Montey Hora Primary Care Physician: Lynett Fish Other Clinician: Referring Physician: Mortimer Fries Treating Physician/Extender: Frann Rider in Treatment: 0 Fall Risk Assessment Items Have you had 2 or more falls in the last 12 monthso 0 No Have you had any fall that resulted in injury in the last 12 monthso 0 No FALL RISK ASSESSMENT: History of falling - immediate or within 3 months 0 No Secondary diagnosis  0 No Ambulatory aid None/bed rest/wheelchair/nurse 0 No Crutches/cane/walker 15 Yes Furniture 0 No IV Access/Saline Lock 0 No Gait/Training Normal/bed rest/immobile 0 No Weak 10 Yes Impaired 0 No Mental Status Oriented to own ability 0 Yes Electronic Signature(s) Signed: 03/28/2016 4:42:09 PM By: Montey Hora Entered By: Montey Hora on 03/28/2016 08:14:11 Lindsey English ([id number]) -------------------------------------------------------------------------------- Foot Assessment Details Patient Name: Lindsey English. Date of Service: 03/28/2016 8:00 AM Medical Record Number: [id number] Patient Account Number: [id number] Date of Birth/Sex: [birth year] (82 y.o. Female) Treating RN: Montey Hora Primary Care Physician: Lynett Fish Other Clinician: Referring Physician: Mortimer Fries Treating Physician/Extender: Frann Rider in Treatment: 0 Foot Assessment Items Site Locations + = Sensation present, - = Sensation absent, C = Callus, U = Ulcer R = Redness, W = Warmth, M = Maceration, PU = Pre-ulcerative lesion F = Fissure, S = Swelling, D = Dryness Assessment Right: Left: Other Deformity: No No Prior Foot Ulcer: No No Prior  Amputation: No No Charcot Joint: No No Ambulatory Status: Ambulatory With Help Assistance Device: Walker Gait: Steady Electronic Signature(s) Signed: 03/28/2016 4:42:09 PM By: Montey Hora Entered By: Montey Hora on 03/28/2016 08:14:36 Lindsey English ([id number]) -------------------------------------------------------------------------------- Nutrition Risk Assessment Details Patient Name: Lindsey English. Date of Service: 03/28/2016 8:00 AM Medical Record Number: [id number] Patient Account Number: [id number] Date of Birth/Sex: Oct 15, 1929 (82 y.o. Female) Treating RN: Montey Hora Primary Care Physician: Lynett Fish Other Clinician: Referring Physician: Mortimer Fries Treating Physician/Extender: Frann Rider in Treatment: 0 Height (in): 60 Weight (lbs): 131 Body Mass Index (BMI): 25.6 Nutrition Risk Assessment Items NUTRITION RISK SCREEN: I have an illness or condition that made me change the kind and/or 0 No amount of food I eat I eat fewer than two meals per day 0 No I eat few fruits and vegetables, or milk products 0 No I have three or more drinks of beer, liquor or wine almost every day 0 No I have tooth or mouth problems that make it hard for me to eat 0 No I don't always have enough money to buy the food I need 0 No I eat alone most of the time 0 No I take three or more different prescribed or over-the-counter drugs a 1 Yes day Without wanting to, I have lost or gained 10 pounds in the last six 0 No months I am not always physically able to shop, cook and/or feed myself 0 No Nutrition Protocols Good Risk Protocol 0 No interventions needed Moderate Risk Protocol Electronic Signature(s) Signed: 03/28/2016 4:42:09 PM By: Montey Hora Entered By: Montey Hora on 03/28/2016 08:14:17

## 2016-03-29 NOTE — Progress Notes (Signed)
ANTINA, Lindsey ([id number]) Visit Report for 03/28/2016 Allergy List Details Patient Name: Lindsey English, Lindsey English. Date of Service: 03/28/2016 8:00 AM Medical Record Number: [id number] Patient Account Number: [id number] Date of Birth/Sex: 08/20/1929 (82 y.o. Female) Treating RN: Montey Hora Primary Care Physician: Lynett Fish Other Clinician: Referring Physician: Mortimer Fries Treating Physician/Extender: Frann Rider in Treatment: 0 Allergies Active Allergies Shellfish Containing Products Sulfa (Sulfonamide Antibiotics) Keflex Allergy Notes Electronic Signature(s) Signed: 03/28/2016 4:42:09 PM By: Montey Hora Entered By: Montey Hora on 03/28/2016 08:51:35 Lindsey English ([id number]) -------------------------------------------------------------------------------- Arrival Information Details Patient Name: Lindsey English, Lindsey English. Date of Service: 03/28/2016 8:00 AM Medical Record Number: [id number] Patient Account Number: [id number] Date of Birth/Sex: 03-26-1930 (82 y.o. Female) Treating RN: Montey Hora Primary Care Physician: Lynett Fish Other Clinician: Referring Physician: Mortimer Fries Treating Physician/Extender: Frann Rider in Treatment: 0 Visit Information Patient Arrived: Charlyn Minerva Time: 08:04 Accompanied By: spouse Transfer Assistance: None Patient Identification Verified: Yes Secondary Verification Process Yes Completed: Patient Has Alerts: Yes Patient Alerts: ABI [place] BILATERAL >220 Electronic Signature(s) Signed: 03/28/2016 4:42:09 PM By: Montey Hora Entered By: Montey Hora on 03/28/2016 08:52:35 Lindsey English ([id number]) -------------------------------------------------------------------------------- Clinic Level of Care Assessment Details Patient Name: Lindsey English. Date of Service: 03/28/2016 8:00 AM Medical Record Number: [id number] Patient Account Number: [id number] Date of Birth/Sex: 16-Jun-1929 (82 y.o. Female) Treating  RN: Montey Hora Primary Care Physician: Lynett Fish Other Clinician: Referring Physician: Mortimer Fries Treating Physician/Extender: Frann Rider in Treatment: 0 Clinic Level of Care Assessment Items TOOL 2 Quantity Score []  - Use when only an EandM is performed on the INITIAL visit 0 ASSESSMENTS - Nursing Assessment / Reassessment X - General Physical Exam (combine w/ comprehensive assessment (listed just 1 20 below) when performed on new pt. evals) X - Comprehensive Assessment (HX, ROS, Risk Assessments, Wounds Hx, etc.) 1 25 ASSESSMENTS - Wound and Skin Assessment / Reassessment X - Simple Wound Assessment / Reassessment - one wound 1 5 []  - Complex Wound Assessment / Reassessment - multiple wounds 0 []  - Dermatologic / Skin Assessment (not related to wound area) 0 ASSESSMENTS - Ostomy and/or Continence Assessment and Care []  - Incontinence Assessment and Management 0 []  - Ostomy Care Assessment and Management (repouching, etc.) 0 PROCESS - Coordination of Care X - Simple Patient / Family Education for ongoing care 1 15 []  - Complex (extensive) Patient / Family Education for ongoing care 0 X - Staff obtains Programmer, systems, Records, Test Results / Process Orders 1 10 []  - Staff telephones HHA, Nursing Homes / Clarify orders / etc 0 []  - Routine Transfer to another Facility (non-emergent condition) 0 []  - Routine Hospital Admission (non-emergent condition) 0 X - New Admissions / Biomedical engineer / Ordering NPWT, Apligraf, etc. 1 15 []  - Emergency Hospital Admission (emergent condition) 0 X - Simple Discharge Coordination 1 10 Lindsey English, SHERO. ([id number]) []  - Complex (extensive) Discharge Coordination 0 PROCESS - Special Needs []  - Pediatric / Minor Patient Management 0 []  - Isolation Patient Management 0 []  - Hearing / Language / Visual special needs 0 []  - Assessment of Community assistance (transportation, D/C planning, etc.) 0 []  - Additional assistance /  Altered mentation 0 []  - Support Surface(s) Assessment (bed, cushion, seat, etc.) 0 INTERVENTIONS - Wound Cleansing / Measurement X - Wound Imaging (photographs - any number of wounds) 1 5 []  - Wound Tracing (instead of photographs) 0 X - Simple Wound Measurement - one wound 1 5 []  - Complex Wound  Measurement - multiple wounds 0 X - Simple Wound Cleansing - one wound 1 5 []  - Complex Wound Cleansing - multiple wounds 0 INTERVENTIONS - Wound Dressings []  - Small Wound Dressing one or multiple wounds 0 X - Medium Wound Dressing one or multiple wounds 1 15 []  - Large Wound Dressing one or multiple wounds 0 []  - Application of Medications - injection 0 INTERVENTIONS - Miscellaneous []  - External ear exam 0 []  - Specimen Collection (cultures, biopsies, blood, body fluids, etc.) 0 []  - Specimen(s) / Culture(s) sent or taken to Lab for analysis 0 []  - Patient Transfer (multiple staff / Harrel Lemon Lift / Similar devices) 0 []  - Simple Staple / Suture removal (25 or less) 0 []  - Complex Staple / Suture removal (26 or more) 0 [name], [name] ([id number]) []  - Hypo / Hyperglycemic Management (close monitor of Blood Glucose) 0 X - Ankle / Brachial Index (ABI) - do not check if billed separately 1 15 Has the patient been seen at the hospital within the last three years: Yes Total Score: 145 Level Of Care: New/Established - Level 4 Electronic Signature(s) Signed: 03/28/2016 4:42:09 PM By: Montey Hora Entered By: Montey Hora on 03/28/2016 08:59:08 Lindsey English ([id number]) -------------------------------------------------------------------------------- Encounter Discharge Information Details Patient Name: Lindsey English, Lindsey English. Date of Service: 03/28/2016 8:00 AM Medical Record Number: [id number] Patient Account Number: [id number] Date of Birth/Sex: 1929/06/27 (82 y.o. Female) Treating RN: Montey Hora Primary Care Physician: Lynett Fish Other Clinician: Referring Physician: Mortimer Fries Treating Physician/Extender: Frann Rider in Treatment: 0 Encounter Discharge Information Items Discharge Pain Level: 0 Discharge Condition: Stable Ambulatory Status: Walker Discharge Destination: Home Transportation: Private Auto Accompanied By: spouse Schedule Follow-up Appointment: Yes Medication Reconciliation completed No and provided to Patient/Care Zailyn Rowser: Provided on Clinical Summary of Care: 03/28/2016 Form Type Recipient Paper Patient [facility] Electronic Signature(s) Signed: 03/28/2016 9:46:08 AM By: Montey Hora Previous Signature: 03/28/2016 9:15:42 AM Version By: Ruthine Dose Entered By: Montey Hora on 03/28/2016 09:46:08 Lindsey English ([id number]) -------------------------------------------------------------------------------- Lower Extremity Assessment Details Patient Name: Lindsey English. Date of Service: 03/28/2016 8:00 AM Medical Record Number: [id number] Patient Account Number: [id number] Date of Birth/Sex: Nov 12, 1929 (82 y.o. Female) Treating RN: Montey Hora Primary Care Physician: Lynett Fish Other Clinician: Referring Physician: Mortimer Fries Treating Physician/Extender: Frann Rider in Treatment: 0 Edema Assessment Assessed: Shirlyn Goltz: No] [Right: No] Edema: [Left: Yes] [Right: Yes] Calf Left: Right: Point of Measurement: 30 cm From Medial Instep 31.5 cm 33.3 cm Ankle Left: Right: Point of Measurement: 10 cm From Medial Instep 19.6 cm 20.5 cm Vascular Assessment Pulses: Posterior Tibial Palpable: [Left:Yes] [Right:Yes] Doppler: [Left:Monophasic] [Right:Monophasic] Dorsalis Pedis Palpable: [Left:Yes] [Right:Yes] Doppler: [Left:Monophasic] [Right:Monophasic] Extremity colors, hair growth, and conditions: Extremity Color: [Left:Hyperpigmented] [Right:Hyperpigmented] Hair Growth on Extremity: [Left:No] [Right:No] Temperature of Extremity: [Left:Warm] Capillary Refill: [Left:< 3 seconds] [Right:< 3 seconds] Toe Nail  Assessment Left: Right: Thick: Yes Yes Discolored: Yes Yes Deformed: Yes Yes Improper Length and Hygiene: Yes Yes Notes ABI [place] BILATERAL >220 RAJENE, QUINTER ([id number]) Electronic Signature(s) Signed: 03/28/2016 4:42:09 PM By: Montey Hora Entered By: Montey Hora on 03/28/2016 08:42:37 Lindsey English ([id number]) -------------------------------------------------------------------------------- Multi Wound Chart Details Patient Name: Lindsey English. Date of Service: 03/28/2016 8:00 AM Medical Record Number: [id number] Patient Account Number: [id number] Date of Birth/Sex: 02-27-1930 (82 y.o. Female) Treating RN: Montey Hora Primary Care Physician: Lynett Fish Other Clinician: Referring Physician: Mortimer Fries Treating Physician/Extender: Frann Rider in Treatment: 0 Vital Signs Height(in): 60 Pulse(bpm): 74  Weight(lbs): 131 Blood Pressure 190/72 (mmHg): Body Mass Index(BMI): 26 Temperature(F): 97.7 Respiratory Rate 18 (breaths/min): Photos: [N/A:N/A] Wound Location: Right Lower Leg - Medial Right Lower Leg - Lateral N/A Wounding Event: Gradually Appeared Gradually Appeared N/A Primary Etiology: Venous Leg Ulcer Venous Leg Ulcer N/A Comorbid History: Cataracts, Asthma, Deep Cataracts, Asthma, Deep N/A Vein Thrombosis, Vein Thrombosis, Hypertension, Type II Hypertension, Type II Diabetes Diabetes Date Acquired: 02/22/2016 02/22/2016 N/A Weeks of Treatment: 0 0 N/A Wound Status: Open Open N/A Measurements L x W x D 2.5x3.5x0.2 1x1.1x0.1 N/A (cm) Area (cm) : 6.872 0.864 N/A Volume (cm) : 1.374 0.086 N/A % Reduction in Area: 0.00% N/A N/A % Reduction in Volume: 0.00% N/A N/A Classification: Full Thickness Without Partial Thickness N/A Exposed Support Structures HBO Classification: Grade 1 Grade 1 N/A Exudate Amount: Large Large N/A Exudate Type: Serous Serous N/A Exudate Color: amber amber N/A Lindsey English, Lindsey English ([id number]) Wound Margin: Flat  and Intact Flat and Intact N/A Granulation Amount: Large (67-100%) Large (67-100%) N/A Granulation Quality: Red, Pink Pink N/A Necrotic Amount: None Present (0%) None Present (0%) N/A Exposed Structures: Fascia: No Fascia: No N/A Fat: No Fat: No Tendon: No Tendon: No Muscle: No Muscle: No Joint: No Joint: No Bone: No Bone: No Limited to Skin Limited to Skin Breakdown Breakdown Epithelialization: Medium (34-66%) Medium (34-66%) N/A Periwound Skin Texture: Edema: No Edema: No N/A Excoriation: No Excoriation: No Induration: No Induration: No Callus: No Callus: No Crepitus: No Crepitus: No Fluctuance: No Fluctuance: No Friable: No Friable: No Rash: No Rash: No Scarring: No Scarring: No Periwound Skin Moist: Yes Moist: Yes N/A Moisture: Maceration: No Maceration: No Dry/Scaly: No Dry/Scaly: No Periwound Skin Color: Atrophie Blanche: No Atrophie Blanche: No N/A Cyanosis: No Cyanosis: No Ecchymosis: No Ecchymosis: No Erythema: No Erythema: No Hemosiderin Staining: No Hemosiderin Staining: No Mottled: No Mottled: No Pallor: No Pallor: No Rubor: No Rubor: No Temperature: No Abnormality No Abnormality N/A Tenderness on Yes Yes N/A Palpation: Wound Preparation: Ulcer Cleansing: Ulcer Cleansing: N/A Rinsed/Irrigated with Rinsed/Irrigated with Saline Saline Topical Anesthetic Topical Anesthetic Applied: Other: lidocaine Applied: Other: lidocaine 4% 4% Treatment Notes Electronic Signature(s) Signed: 03/28/2016 4:42:09 PM By: Montey Hora Entered By: Montey Hora on 03/28/2016 08:53:57 SHALYCE, BELAND ([id number]) Lindsey English, Lindsey English ([id number]) -------------------------------------------------------------------------------- [place] Details Patient Name: Lindsey English, Lindsey English. Date of Service: 03/28/2016 8:00 AM Medical Record Number: [id number] Patient Account Number: [id number] Date of Birth/Sex: 05/18/1930 (82 y.o. Female) Treating RN:  Montey Hora Primary Care Physician: Lynett Fish Other Clinician: Referring Physician: Mortimer Fries Treating Physician/Extender: Frann Rider in Treatment: 0 Active Inactive Abuse / Safety / Falls / Self Care Management Nursing Diagnoses: Impaired physical mobility Potential for falls Goals: Patient will remain injury free Date Initiated: 03/28/2016 Goal Status: Active Interventions: Assess fall risk on admission and as needed Notes: Orientation to the Wound Care Program Nursing Diagnoses: Knowledge deficit related to the wound healing center program Goals: Patient/caregiver will verbalize understanding of the [place] Program Date Initiated: 03/28/2016 Goal Status: Active Interventions: Provide education on orientation to the wound center Notes: Wound/Skin Impairment Nursing Diagnoses: Impaired tissue integrity Goals: Patient/caregiver will verbalize understanding of skin care regimen SHERMAN, ASTWOOD ([id number]) Date Initiated: 03/28/2016 Goal Status: Active Ulcer/skin breakdown will have a volume reduction of 30% by week 4 Date Initiated: 03/28/2016 Goal Status: Active Ulcer/skin breakdown will have a volume reduction of 50% by week 8 Date Initiated: 03/28/2016 Goal Status: Active Ulcer/skin breakdown will have a volume reduction of 80%  by week 12 Date Initiated: 03/28/2016 Goal Status: Active Ulcer/skin breakdown will heal within 14 weeks Date Initiated: 03/28/2016 Goal Status: Active Interventions: Assess patient/caregiver ability to obtain necessary supplies Assess patient/caregiver ability to perform ulcer/skin care regimen upon admission and as needed Assess ulceration(s) every visit Notes: Electronic Signature(s) Signed: 03/28/2016 4:42:09 PM By: Montey Hora Entered By: Montey Hora on 03/28/2016 08:53:13 Lindsey English ([id number]) -------------------------------------------------------------------------------- Pain  Assessment Details Patient Name: Lindsey English. Date of Service: 03/28/2016 8:00 AM Medical Record Number: [id number] Patient Account Number: [id number] Date of Birth/Sex: 1929-11-24 (82 y.o. Female) Treating RN: Montey Hora Primary Care Physician: Lynett Fish Other Clinician: Referring Physician: Mortimer Fries Treating Physician/Extender: Frann Rider in Treatment: 0 Active Problems Location of Pain Severity and Description of Pain Patient Has Paino Yes Site Locations Pain Location: Pain in Ulcers With Dressing Change: Yes Duration of the Pain. Constant / Intermittento Constant Pain Management and Medication Current Pain Management: Notes Topical or injectable lidocaine is offered to patient for acute pain when surgical debridement is performed. If needed, Patient is instructed to use over the counter pain medication for the following 24-48 hours after debridement. Wound care MDs do not prescribed pain medications. Patient has chronic pain or uncontrolled pain. Patient has been instructed to make an appointment with their Primary Care Physician for pain management. Electronic Signature(s) Signed: 03/28/2016 4:42:09 PM By: Montey Hora Entered By: Montey Hora on 03/28/2016 08:10:04 Lindsey English ([id number]) -------------------------------------------------------------------------------- Patient/Caregiver Education Details Patient Name: Lindsey English, Lindsey English. Date of Service: 03/28/2016 8:00 AM Medical Record Number: [id number] Patient Account Number: [id number] Date of Birth/Gender: 1929/09/19 (82 y.o. Female) Treating RN: Montey Hora Primary Care Physician: Lynett Fish Other Clinician: Referring Physician: Mortimer Fries Treating Physician/Extender: Frann Rider in Treatment: 0 Education Assessment Education Provided To: Patient and Caregiver Education Topics Provided Wound/Skin Impairment: Handouts: Other: wound care as ordered Methods:  Demonstration, Explain/Verbal Responses: State content correctly Electronic Signature(s) Signed: 03/28/2016 4:42:09 PM By: Montey Hora Entered By: Montey Hora on 03/28/2016 09:46:24 Lindsey English ([id number]) -------------------------------------------------------------------------------- Wound Assessment Details Patient Name: Lindsey English. Date of Service: 03/28/2016 8:00 AM Medical Record Number: [id number] Patient Account Number: [id number] Date of Birth/Sex: 04/09/1930 (82 y.o. Female) Treating RN: Montey Hora Primary Care Physician: Lynett Fish Other Clinician: Referring Physician: Mortimer Fries Treating Physician/Extender: Frann Rider in Treatment: 0 Wound Status Wound Number: 1 Primary Venous Leg Ulcer Etiology: Wound Location: Right Lower Leg - Medial Wound Open Wounding Event: Gradually Appeared Status: Date Acquired: 02/22/2016 Comorbid Cataracts, Asthma, Deep Vein Weeks Of Treatment: 0 History: Thrombosis, Hypertension, Type II Clustered Wound: No Diabetes Photos Wound Measurements Length: (cm) 2.5 % Reduction i Width: (cm) 3.5 % Reduction i Depth: (cm) 0.2 Epithelializa Area: (cm) 6.872 Tunneling: Volume: (cm) 1.374 Undermining: n Area: 0% n Volume: 0% tion: Medium (34-66%) No No Wound Description Full Thickness Without Classification: Exposed Support Structures Diabetic Severity Grade 1 (Wagner): Wound Margin: Flat and Intact Exudate Amount: Large Exudate Type: Serous Exudate Color: amber Foul Odor After Cleansing: No Wound Bed Granulation Amount: Large (67-100%) Exposed Structure Granulation Quality: Red, Pink Fascia Exposed: No Lindsey English, SEWELL. ([id number]) Necrotic Amount: None Present (0%) Fat Layer Exposed: No Tendon Exposed: No Muscle Exposed: No Joint Exposed: No Bone Exposed: No Limited to Skin Breakdown Periwound Skin Texture Texture Color No Abnormalities Noted: No No Abnormalities Noted:  No Callus: No Atrophie Blanche: No Crepitus: No Cyanosis: No Excoriation: No Ecchymosis: No Fluctuance: No Erythema: No Friable: No Hemosiderin Staining:  No Induration: No Mottled: No Localized Edema: No Pallor: No Rash: No Rubor: No Scarring: No Temperature / Pain Moisture Temperature: No Abnormality No Abnormalities Noted: No Tenderness on Palpation: Yes Dry / Scaly: No Maceration: No Moist: Yes Wound Preparation Ulcer Cleansing: Rinsed/Irrigated with Saline Topical Anesthetic Applied: Other: lidocaine 4%, Treatment Notes Wound #1 (Right, Medial Lower Leg) 1. Cleansed with: Clean wound with Normal Saline 2. Anesthetic Topical Lidocaine 4% cream to wound bed prior to debridement 3. Peri-wound Care: Antifungal cream 4. Dressing Applied: Other dressing (specify in notes) 5. Secondary Dressing Applied Gauze and Kerlix/Conform 7. Secured with Tape Notes netting Lindsey English, Lindsey English ([id number]) Electronic Signature(s) Signed: 03/28/2016 4:42:09 PM By: Montey Hora Entered By: Montey Hora on 03/28/2016 08:31:51 Lindsey English ([id number]) -------------------------------------------------------------------------------- [place] Details Patient Name: Lindsey English, Lindsey English. Date of Service: 03/28/2016 8:00 AM Medical Record Number: [id number] Patient Account Number: [id number] Date of Birth/Sex: 1929-11-03 (82 y.o. Female) Treating RN: Montey Hora Primary Care Physician: Lynett Fish Other Clinician: Referring Physician: Mortimer Fries Treating Physician/Extender: Frann Rider in Treatment: 0 Vital Signs Time Taken: 08:10 Temperature (F): 97.7 Height (in): 60 Pulse (bpm): 74 Source: Stated Respiratory Rate (breaths/min): 18 Weight (lbs): 131 Blood Pressure (mmHg): 190/72 Source: Stated Reference Range: 80 - 120 mg / dl Body Mass Index (BMI): 25.6 Electronic Signature(s) Signed: 03/28/2016 4:42:09 PM By: Montey Hora Entered By: Montey Hora on  03/28/2016 08:10:59

## 2016-04-04 ENCOUNTER — Encounter: Payer: Medicare Other | Admitting: Surgery

## 2016-04-04 DIAGNOSIS — E11622 Type 2 diabetes mellitus with other skin ulcer: Secondary | ICD-10-CM | POA: Diagnosis not present

## 2016-04-04 NOTE — Progress Notes (Signed)
KARINA, BRASSEUR ([id number]) Visit Report for 04/04/2016 Arrival Information Details Patient Name: KESHAUN, NEWNHAM. Date of Service: 04/04/2016 10:00 AM Medical Record Number: [id number] Patient Account Number: [id number] Date of Birth/Sex: 15-Oct-1929 (82 y.o. Female) Treating RN: Montey Hora Primary Care Physician: Lynett Fish Other Clinician: Referring Physician: Lynett Fish Treating Physician/Extender: Frann Rider in Treatment: 1 Visit Information History Since Last Visit Added or deleted any medications: No Patient Arrived: Walker Any new allergies or adverse reactions: No Arrival Time: 09:58 Had a fall or experienced change in No Accompanied By: spouse activities of daily living that may affect Transfer Assistance: None risk of falls: Patient Identification Verified: Yes Signs or symptoms of abuse/neglect since last No Secondary Verification Process Yes visito Completed: Hospitalized since last visit: No Patient Has Alerts: Yes Pain Present Now: No Patient Alerts: ABI [place] BILATERAL >220 Electronic Signature(s) Signed: 04/04/2016 4:15:03 PM By: Montey Hora Entered By: Montey Hora on 04/04/2016 10:00:52 [name] ([id number]) -------------------------------------------------------------------------------- Clinic Level of Care Assessment Details Patient Name: [name]. Date of Service: 04/04/2016 10:00 AM Medical Record Number: [id number] Patient Account Number: [id number] Date of Birth/Sex: [birth year] (82 y.o. Female) Treating RN: Montey Hora Primary Care Physician: Lynett Fish Other Clinician: Referring Physician: Lynett Fish Treating Physician/Extender: Frann Rider in Treatment: 1 Clinic Level of Care Assessment Items TOOL 4 Quantity Score []  - Use when only an EandM is performed on FOLLOW-UP visit 0 ASSESSMENTS - Nursing Assessment / Reassessment X - Reassessment of Co-morbidities (includes updates in  patient status) 1 10 X - Reassessment of Adherence to Treatment Plan 1 5 ASSESSMENTS - Wound and Skin Assessment / Reassessment X - Simple Wound Assessment / Reassessment - one wound 1 5 []  - Complex Wound Assessment / Reassessment - multiple wounds 0 []  - Dermatologic / Skin Assessment (not related to wound area) 0 ASSESSMENTS - Focused Assessment []  - Circumferential Edema Measurements - multi extremities 0 []  - Nutritional Assessment / Counseling / Intervention 0 X - Lower Extremity Assessment (monofilament, tuning fork, pulses) 1 5 []  - Peripheral Arterial Disease Assessment (using hand held doppler) 0 ASSESSMENTS - Ostomy and/or Continence Assessment and Care []  - Incontinence Assessment and Management 0 []  - Ostomy Care Assessment and Management (repouching, etc.) 0 PROCESS - Coordination of Care X - Simple Patient / Family Education for ongoing care 1 15 []  - Complex (extensive) Patient / Family Education for ongoing care 0 []  - Staff obtains Programmer, systems, Records, Test Results / Process Orders 0 []  - Staff telephones HHA, Nursing Homes / Clarify orders / etc 0 []  - Routine Transfer to another Facility (non-emergent condition) 0 SAMAIAH, MOESCH ([id number]) []  - Routine Hospital Admission (non-emergent condition) 0 []  - New Admissions / Biomedical engineer / Ordering NPWT, Apligraf, etc. 0 []  - Emergency Hospital Admission (emergent condition) 0 X - Simple Discharge Coordination 1 10 []  - Complex (extensive) Discharge Coordination 0 PROCESS - Special Needs []  - Pediatric / Minor Patient Management 0 []  - Isolation Patient Management 0 []  - Hearing / Language / Visual special needs 0 []  - Assessment of Community assistance (transportation, D/C planning, etc.) 0 []  - Additional assistance / Altered mentation 0 []  - Support Surface(s) Assessment (bed, cushion, seat, etc.) 0 INTERVENTIONS - Wound Cleansing / Measurement X - Simple Wound Cleansing - one wound 1 5 []  - Complex  Wound Cleansing - multiple wounds 0 X - Wound Imaging (photographs - any number of wounds) 1 5 []  - Wound Tracing (  instead of photographs) 0 X - Simple Wound Measurement - one wound 1 5 []  - Complex Wound Measurement - multiple wounds 0 INTERVENTIONS - Wound Dressings []  - Small Wound Dressing one or multiple wounds 0 X - Medium Wound Dressing one or multiple wounds 1 15 []  - Large Wound Dressing one or multiple wounds 0 []  - Application of Medications - topical 0 []  - Application of Medications - injection 0 INTERVENTIONS - Miscellaneous []  - External ear exam 0 VALLERY, KOSANKE ([id number]) []  - Specimen Collection (cultures, biopsies, blood, body fluids, etc.) 0 []  - Specimen(s) / Culture(s) sent or taken to Lab for analysis 0 []  - Patient Transfer (multiple staff / Harrel Lemon Lift / Similar devices) 0 []  - Simple Staple / Suture removal (25 or less) 0 []  - Complex Staple / Suture removal (26 or more) 0 []  - Hypo / Hyperglycemic Management (close monitor of Blood Glucose) 0 []  - Ankle / Brachial Index (ABI) - do not check if billed separately 0 X - Vital Signs 1 5 Has the patient been seen at the hospital within the last three years: Yes Total Score: 85 Level Of Care: New/Established - Level 3 Electronic Signature(s) Signed: 04/04/2016 4:15:03 PM By: Montey Hora Entered By: Montey Hora on 04/04/2016 10:22:44 [name] ([id number]) -------------------------------------------------------------------------------- Encounter Discharge Information Details Patient Name: SHONE, GRENNELL. Date of Service: 04/04/2016 10:00 AM Medical Record Number: [id number] Patient Account Number: [id number] Date of Birth/Sex: 1929/07/09 (82 y.o. Female) Treating RN: Montey Hora Primary Care Physician: Lynett Fish Other Clinician: Referring Physician: Lynett Fish Treating Physician/Extender: Frann Rider in Treatment: 1 Encounter Discharge Information Items Discharge Pain  Level: 0 Discharge Condition: Stable Ambulatory Status: Walker Discharge Destination: Home Transportation: Private Auto Accompanied By: spouse Schedule Follow-up Appointment: Yes Medication Reconciliation completed and provided to Patient/Care No Lailoni Baquera: Provided on Clinical Summary of Care: 04/04/2016 Form Type Recipient Paper Patient [facility] Electronic Signature(s) Signed: 04/04/2016 10:33:54 AM By: Ruthine Dose Entered By: Ruthine Dose on 04/04/2016 10:33:54 [name] ([id number]) -------------------------------------------------------------------------------- Lower Extremity Assessment Details Patient Name: [name]. Date of Service: 04/04/2016 10:00 AM Medical Record Number: [id number] Patient Account Number: [id number] Date of Birth/Sex: 04/27/1930 (82 y.o. Female) Treating RN: Montey Hora Primary Care Physician: Lynett Fish Other Clinician: Referring Physician: Lynett Fish Treating Physician/Extender: Frann Rider in Treatment: 1 Edema Assessment Assessed: [Left: No] [Right: No] Edema: [Left: Ye] [Right: s] Calf Left: Right: Point of Measurement: 30 cm From Medial Instep cm cm Ankle Left: Right: Point of Measurement: 10 cm From Medial Instep cm cm Vascular Assessment Pulses: Posterior Tibial Dorsalis Pedis Palpable: [Right:Yes] Extremity colors, hair growth, and conditions: Extremity Color: [Right:Red] Hair Growth on Extremity: [Right:No] Temperature of Extremity: [Right:Warm] Capillary Refill: [Right:< 3 seconds] Toe Nail Assessment Left: Right: Thick: Yes Discolored: Yes Deformed: No Improper Length and Hygiene: Yes Electronic Signature(s) Signed: 04/04/2016 4:15:03 PM By: Montey Hora Entered By: Montey Hora on 04/04/2016 10:08:51 [name] ([id number]) -------------------------------------------------------------------------------- Multi Wound Chart Details Patient Name: [name]. Date of Service:  04/04/2016 10:00 AM Medical Record Number: [id number] Patient Account Number: [id number] Date of Birth/Sex: November 30, 1929 (82 y.o. Female) Treating RN: Montey Hora Primary Care Physician: Lynett Fish Other Clinician: Referring Physician: Lynett Fish Treating Physician/Extender: Frann Rider in Treatment: 1 Vital Signs Height(in): 60 Pulse(bpm): 74 Weight(lbs): 131 Blood Pressure 190/64 (mmHg): Body Mass Index(BMI): 26 Temperature(F): 97.9 Respiratory Rate 18 (breaths/min): Photos: [N/A:N/A] Wound Location: Right Lower Leg - Medial N/A  N/A Wounding Event: Gradually Appeared N/A N/A Primary Etiology: Venous Leg Ulcer N/A N/A Comorbid History: Cataracts, Asthma, Deep N/A N/A Vein Thrombosis, Hypertension, Type II Diabetes Date Acquired: 02/22/2016 N/A N/A Weeks of Treatment: 1 N/A N/A Wound Status: Open N/A N/A Measurements L x W x D 0.4x1x0.1 N/A N/A (cm) Area (cm) : 0.314 N/A N/A Volume (cm) : 0.031 N/A N/A % Reduction in Area: 95.40% N/A N/A % Reduction in Volume: 97.70% N/A N/A Classification: Full Thickness Without N/A N/A Exposed Support Structures HBO Classification: Grade 1 N/A N/A Exudate Amount: Large N/A N/A Exudate Type: Serous N/A N/A Exudate Color: amber N/A N/A PRANITA, ARA ([id number]) Wound Margin: Flat and Intact N/A N/A Granulation Amount: Large (67-100%) N/A N/A Granulation Quality: Red, Pink N/A N/A Necrotic Amount: None Present (0%) N/A N/A Exposed Structures: Fascia: No N/A N/A Fat: No Tendon: No Muscle: No Joint: No Bone: No Limited to Skin Breakdown Epithelialization: Medium (34-66%) N/A N/A Periwound Skin Texture: Edema: No N/A N/A Excoriation: No Induration: No Callus: No Crepitus: No Fluctuance: No Friable: No Rash: No Scarring: No Periwound Skin Moist: Yes N/A N/A Moisture: Maceration: No Dry/Scaly: No Periwound Skin Color: Atrophie Blanche: No N/A N/A Cyanosis: No Ecchymosis: No Erythema:  No Hemosiderin Staining: No Mottled: No Pallor: No Rubor: No Temperature: No Abnormality N/A N/A Tenderness on Yes N/A N/A Palpation: Wound Preparation: Ulcer Cleansing: N/A N/A Rinsed/Irrigated with Saline Topical Anesthetic Applied: Other: lidocaine 4% Treatment Notes Electronic Signature(s) Signed: 04/04/2016 4:15:03 PM By: Montey Hora Entered By: Montey Hora on 04/04/2016 10:12:45 AVAEH, GASQUE ([id number]) DAVIELLE, HUTCHCRAFT ([id number]) -------------------------------------------------------------------------------- [place] Details Patient Name: GLORIANNE, GUISINGER. Date of Service: 04/04/2016 10:00 AM Medical Record Number: [id number] Patient Account Number: [id number] Date of Birth/Sex: [birth year] (82 y.o. Female) Treating RN: Montey Hora Primary Care Physician: Lynett Fish Other Clinician: Referring Physician: Lynett Fish Treating Physician/Extender: Frann Rider in Treatment: 1 Active Inactive Abuse / Safety / Falls / Self Care Management Nursing Diagnoses: Impaired physical mobility Potential for falls Goals: Patient will remain injury free Date Initiated: 03/28/2016 Goal Status: Active Interventions: Assess fall risk on admission and as needed Notes: Orientation to the Wound Care Program Nursing Diagnoses: Knowledge deficit related to the wound healing center program Goals: Patient/caregiver will verbalize understanding of the [place] Program Date Initiated: 03/28/2016 Goal Status: Active Interventions: Provide education on orientation to the wound center Notes: Wound/Skin Impairment Nursing Diagnoses: Impaired tissue integrity Goals: Patient/caregiver will verbalize understanding of skin care regimen SAMUELA, TINGLE ([id number]) Date Initiated: 03/28/2016 Goal Status: Active Ulcer/skin breakdown will have a volume reduction of 30% by week 4 Date Initiated: 03/28/2016 Goal Status:  Active Ulcer/skin breakdown will have a volume reduction of 50% by week 8 Date Initiated: 03/28/2016 Goal Status: Active Ulcer/skin breakdown will have a volume reduction of 80% by week 12 Date Initiated: 03/28/2016 Goal Status: Active Ulcer/skin breakdown will heal within 14 weeks Date Initiated: 03/28/2016 Goal Status: Active Interventions: Assess patient/caregiver ability to obtain necessary supplies Assess patient/caregiver ability to perform ulcer/skin care regimen upon admission and as needed Assess ulceration(s) every visit Notes: Electronic Signature(s) Signed: 04/04/2016 4:15:03 PM By: Montey Hora Entered By: Montey Hora on 04/04/2016 10:12:39 [name] ([id number]) -------------------------------------------------------------------------------- Pain Assessment Details Patient Name: [name]. Date of Service: 04/04/2016 10:00 AM Medical Record Number: [id number] Patient Account Number: [id number] Date of Birth/Sex: 06-13-1929 (82 y.o. Female) Treating RN: Montey Hora Primary Care Physician: Lynett Fish Other Clinician: Referring Physician: Gilford Rile  III, John Treating Physician/Extender: Frann Rider in Treatment: 1 Active Problems Location of Pain Severity and Description of Pain Patient Has Paino No Site Locations Pain Management and Medication Current Pain Management: Goals for Pain Management Topical or injectable lidocaine is offered to patient for acute pain when surgical debridement is performed. If needed, Patient is instructed to use over the counter pain medication for the following 24-48 hours after debridement. Wound care MDs do not prescribed pain medications. Patient has chronic pain or uncontrolled pain. Patient has been instructed to make an appointment with their Primary Care Physician for pain management. Notes patient reports generalized itching but no pain Electronic Signature(s) Signed: 04/04/2016 4:15:03 PM By:  Montey Hora Entered By: Montey Hora on 04/04/2016 10:01:13 [name] ([id number]) -------------------------------------------------------------------------------- Patient/Caregiver Education Details Patient Name: [name] Date of Service: 04/04/2016 10:00 AM Medical Record Number: [id number] Patient Account Number: [id number] Date of Birth/Gender: [birth year] (82 y.o. Female) Treating RN: Montey Hora Primary Care Physician: Lynett Fish Other Clinician: Referring Physician: Lynett Fish Treating Physician/Extender: Frann Rider in Treatment: 1 Education Assessment Education Provided To: Patient and Caregiver Education Topics Provided Wound/Skin Impairment: Handouts: Other: wound care as ordered Methods: Demonstration, Explain/Verbal Responses: State content correctly Electronic Signature(s) Signed: 04/04/2016 4:15:03 PM By: Montey Hora Entered By: Montey Hora on 04/04/2016 10:34:02 [name] ([id number]) -------------------------------------------------------------------------------- Wound Assessment Details Patient Name: [name]. Date of Service: 04/04/2016 10:00 AM Medical Record Number: [id number] Patient Account Number: [id number] Date of Birth/Sex: 1929-07-07 (82 y.o. Female) Treating RN: Montey Hora Primary Care Physician: Lynett Fish Other Clinician: Referring Physician: Lynett Fish Treating Physician/Extender: Frann Rider in Treatment: 1 Wound Status Wound Number: 1 Primary Venous Leg Ulcer Etiology: Wound Location: Right Lower Leg - Medial Wound Open Wounding Event: Gradually Appeared Status: Date Acquired: 02/22/2016 Comorbid Cataracts, Asthma, Deep Vein Weeks Of Treatment: 1 History: Thrombosis, Hypertension, Type II Clustered Wound: No Diabetes Photos Wound Measurements Length: (cm) 0.4 % Reduction i Width: (cm) 1 % Reduction i Depth: (cm) 0.1 Epithelializa Area: (cm)  0.314 Tunneling: Volume: (cm) 0.031 Undermining: n Area: 95.4% n Volume: 97.7% tion: Medium (34-66%) No No Wound Description Full Thickness Without Classification: Exposed Support Structures Diabetic Severity Grade 1 (Wagner): Wound Margin: Flat and Intact Exudate Amount: Large Exudate Type: Serous Exudate Color: amber Foul Odor After Cleansing: No Wound Bed Granulation Amount: Large (67-100%) Exposed Structure Granulation Quality: Red, Pink Fascia Exposed: No RYELEIGH, KIMES. ([id number]) Necrotic Amount: None Present (0%) Fat Layer Exposed: No Tendon Exposed: No Muscle Exposed: No Joint Exposed: No Bone Exposed: No Limited to Skin Breakdown Periwound Skin Texture Texture Color No Abnormalities Noted: No No Abnormalities Noted: No Callus: No Atrophie Blanche: No Crepitus: No Cyanosis: No Excoriation: No Ecchymosis: No Fluctuance: No Erythema: No Friable: No Hemosiderin Staining: No Induration: No Mottled: No Localized Edema: No Pallor: No Rash: No Rubor: No Scarring: No Temperature / Pain Moisture Temperature: No Abnormality No Abnormalities Noted: No Tenderness on Palpation: Yes Dry / Scaly: No Maceration: No Moist: Yes Wound Preparation Ulcer Cleansing: Rinsed/Irrigated with Saline Topical Anesthetic Applied: Other: lidocaine 4%, Treatment Notes Wound #1 (Right, Medial Lower Leg) 1. Cleansed with: Clean wound with Normal Saline 2. Anesthetic Topical Lidocaine 4% cream to wound bed prior to debridement 3. Peri-wound Care: Antifungal cream 4. Dressing Applied: Aquacel Ag 5. Secondary Dressing Applied Gauze and Kerlix/Conform Notes netting Electronic Signature(s) Signed: 04/04/2016 4:15:03 PM By: Drucilla Chalet ([id number]) Entered By: Montey Hora on  04/04/2016 10:12:20 LAKIRA, HOOT ([id number]) -------------------------------------------------------------------------------- Vitals Details Patient Name:  TAEISHA, KIMREY. Date of Service: 04/04/2016 10:00 AM Medical Record Number: [id number] Patient Account Number: [id number] Date of Birth/Sex: 10/29/1929 (82 y.o. Female) Treating RN: Montey Hora Primary Care Physician: Lynett Fish Other Clinician: Referring Physician: Lynett Fish Treating Physician/Extender: Frann Rider in Treatment: 1 Vital Signs Time Taken: 10:01 Temperature (F): 97.9 Height (in): 60 Pulse (bpm): 74 Weight (lbs): 131 Respiratory Rate (breaths/min): 18 Body Mass Index (BMI): 25.6 Blood Pressure (mmHg): 190/64 Reference Range: 80 - 120 mg / dl Electronic Signature(s) Signed: 04/04/2016 4:15:03 PM By: Montey Hora Entered By: Montey Hora on 04/04/2016 10:03:41

## 2016-04-04 NOTE — Progress Notes (Signed)
Lindsey, English ([id number]) Visit Report for 04/04/2016 Chief Complaint Document Details Patient Name: Lindsey English, Lindsey English 04/04/2016 10:00 Date of Service: AM Medical Record [id number] Number: Patient Account Number: [id number] [birth year] (82 y.o. Treating RN: Montey Hora Date of Birth/Sex: Female) Other Clinician: Primary Care Physician: Cristal Deer Referring Physician: Lynett Fish Physician/Extender: Suella Grove in Treatment: 1 Information Obtained from: Patient Chief Complaint Patients presents for treatment of an open diabetic ulcer with swelling of the right lower extremity which she's had for about a month Electronic Signature(s) Signed: 04/04/2016 10:45:42 AM By: Christin Fudge MD, FACS Entered By: Christin Fudge on 04/04/2016 10:45:42 Lindsey English ([id number]) -------------------------------------------------------------------------------- HPI Details Patient Name: Lindsey English, Lindsey English 04/04/2016 10:00 Date of Service: AM Medical Record [id number] Number: Patient Account Number: [id number] Jan 29, 1930 (82 y.o. Treating RN: Montey Hora Date of Birth/Sex: Female) Other Clinician: Primary Care Physician: Cristal Deer Referring Physician: Lynett Fish Physician/Extender: Suella Grove in Treatment: 1 History of Present Illness Location: redness, swelling and pain of the right lower extremity Quality: Patient reports experiencing heaviness to affected area(s). Severity: Patient states wound are getting worse. Duration: Patient has had the wound for < 4 weeks prior to presenting for treatment Timing: Pain in wound is Intermittent (comes and goes Context: The wound appeared gradually over time Modifying Factors: Other treatment(s) tried include: admitted to the hospital for IV antibiotics and symptomatic treatment Associated Signs and Symptoms: Patient reports having increase swelling. HPI Description: 82 year old  patient sent to as by her PCP Herbie [place] to me the physician assistant from the [facility]. She has been complaining of leg pain and has a wound on her right lower leg which was diagnosed as a venous stasis ulcer. Could not tolerate and Unna's boots because of pain. Last medical history significant for diabetes mellitus, asthma, DVT, hiatal hernia, hypertension, a medical hernia, cardiac catheterization, hysterectomy, breast biopsy. on 01/04/2016 right lower extremity DVT study was done and it was negative for a DVT. The patient was found to have cellulitis of the right lower extremity and was given ceftriaxone injection in the office and put on Keflex 3 times a day and sent home with this on 02/24/16. The patient was recently admitted to hospital between October 9 and October 11 of 2017 for cellulitis of the right lower extremity with possible allergic reaction to Keflex. she was treated appropriately inpatient with vancomycin and Levaquin and then discharged home onclindamycin and other supportive care. The patient's husband says that she was seen by Dr. Lucky Cowboy a while ago and some tests were done but no procedures were done. 04/04/2016 -- neither he nor we have heard back from Dr. Bunnie Domino office and no reports are available nor has there been any appointment scheduled Electronic Signature(s) Signed: 04/04/2016 10:46:24 AM By: Christin Fudge MD, FACS Entered By: Christin Fudge on 04/04/2016 10:46:24 Lindsey English ([id number]) -------------------------------------------------------------------------------- Physical Exam Details Patient Name: Lindsey English, Lindsey English 04/04/2016 10:00 Date of Service: AM Medical Record [id number] Number: Patient Account Number: [id number] November 25, 1929 (82 y.o. Treating RN: Montey Hora Date of Birth/Sex: Female) Other Clinician: Primary Care Physician: Cristal Deer Referring Physician: Lynett Fish Physician/Extender: Weeks in Treatment: 1 Constitutional . Pulse regular. Respirations normal and unlabored. Afebrile. . Eyes Nonicteric. Reactive to light. Ears, Nose, Mouth, and Throat Lips, teeth, and gums WNL.Marland Kitchen Moist mucosa without lesions. Neck supple and nontender. No palpable supraclavicular or cervical adenopathy. Normal sized without goiter. Respiratory WNL. No  retractions.. Breath sounds WNL, No rubs, rales, rhonchi, or wheeze.. Cardiovascular Heart rhythm and rate regular, no murmur or gallop.. Pedal Pulses WNL. No clubbing, cyanosis or edema. Lymphatic No adneopathy. No adenopathy. No adenopathy. Musculoskeletal Adexa without tenderness or enlargement.. Digits and nails w/o clubbing, cyanosis, infection, petechiae, ischemia, or inflammatory conditions.. Integumentary (Hair, Skin) No suspicious lesions. No crepitus or fluctuance. No peri-wound warmth or erythema. No masses.Marland Kitchen Psychiatric Judgement and insight Intact.. No evidence of depression, anxiety, or agitation.. Notes the overall picture has improved significantly since last week and the fungal infection has abated. The wound itself is less prominent and there is no cellulitis. No sharp debridement was required today. Electronic Signature(s) Signed: 04/04/2016 10:47:39 AM By: Christin Fudge MD, FACS Entered By: Christin Fudge on 04/04/2016 10:47:39 Lindsey, English ([id number]) -------------------------------------------------------------------------------- Physician Orders Details Patient Name: Lindsey English, Lindsey English 04/04/2016 10:00 Date of Service: AM Medical Record [id number] Number: Patient Account Number: [id number] 1929/06/09 (82 y.o. Treating RN: Montey Hora Date of Birth/Sex: Female) Other Clinician: Primary Care Physician: Cristal Deer Referring Physician: Lynett Fish Physician/Extender: Suella Grove in Treatment: 1 Verbal / Phone Orders: Yes Clinician: Montey Hora Read Back and Verified: Yes Diagnosis Coding Wound Cleansing Wound #1 Right,Medial Lower Leg o Clean wound with Normal Saline. o May Shower, gently pat wound dry prior to applying new dressing. Primary Wound Dressing Wound #1 Right,Medial Lower Leg o Aquacel Ag - HHRN may use calcium alginate with silver o Other: - antifungal cream to right lower leg Secondary Dressing Wound #1 Right,Medial Lower Leg o Gauze, ABD and Kerlix/Conform - secure with netting and tape - no coban Dressing Change Frequency Wound #1 Right,Medial Lower Leg o Change dressing every other day. Follow-up Appointments Wound #1 Right,Medial Lower Leg o Return Appointment in 1 week. Edema Control Wound #1 Right,Medial Lower Leg o Elevate legs to the level of the heart and pump ankles as often as possible Home Health Wound #1 Right,Medial Lower Leg o [place] Visits - [facility] please call [place] at [phone] and inform us of the correct [facility] agency so signed wound care orders can be faxed over [facility] Nurse may visit PRN to address patientos wound care needs. o FACE TO FACE ENCOUNTER: MEDICARE and MEDICAID PATIENTS: I certify that this patient is under my care and that I had a face-to-face encounter that meets the physician face-to-face MIMMIE, Lindsey English ([id number]) encounter requirements with this patient on this date. The encounter with the patient was in whole or in part for the following MEDICAL CONDITION: (primary reason for [place]) MEDICAL NECESSITY: I certify, that based on my findings, NURSING services are a medically necessary home health service. HOME BOUND STATUS: I certify that my clinical findings support that this patient is homebound (i.e., Due to illness or injury, pt requires aid of supportive devices such as crutches, cane, wheelchairs, walkers, the use of special transportation or the assistance of another person to leave  their place of residence. There is a normal inability to leave the home and doing so requires considerable and taxing effort. Other absences are for medical reasons / religious services and are infrequent or of short duration when for other reasons). o If current dressing causes regression in wound condition, may D/C ordered dressing product/s and apply Normal Saline Moist Dressing daily until next [place] / Other MD appointment. [place] of regression in wound condition at [phone]. o Please direct any  NON-WOUND related issues/requests for orders to patient's Primary Care Physician Medications-please add to medication list. o Other: - antifungal cream to right lower leg Electronic Signature(s) Signed: 04/04/2016 4:15:03 PM By: Montey Hora Signed: 04/04/2016 4:28:36 PM By: Christin Fudge MD, FACS Entered By: Montey Hora on 04/04/2016 10:22:15 Lindsey English, Lindsey English ([id number]) -------------------------------------------------------------------------------- Problem List Details Patient Name: QUETZALLY, BATAILLE 04/04/2016 10:00 Date of Service: AM Medical Record [id number] Number: Patient Account Number: [id number] 1930/03/03 (82 y.o. 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 Treating RN: Montey Hora Date of Birth/Sex: Female) Other Clinician: Primary Care Physician: Blanche East, Adana Marik Referring Physician: Lynett Fish Physician/Extender: Suella Grove in Treatment: 1 Subjective Chief Complaint Information obtained from Patient Patients presents for treatment of an open diabetic ulcer with swelling of the right lower extremity which she's had for about a month History of Present Illness (HPI) The following HPI elements were documented for the patient's wound: Location: redness, swelling and pain of the right lower extremity Quality: Patient reports experiencing heaviness to affected area(s). Severity: Patient states wound are getting worse. Duration: Patient has had the wound for < 4 weeks prior to presenting for treatment Timing: Pain in wound is Intermittent (comes and goes Context: The wound appeared gradually over time Modifying Factors: Other treatment(s) tried include: admitted to the hospital for IV antibiotics and symptomatic treatment Associated Signs and Symptoms: Patient reports having increase swelling. 82 year old patient sent to as by her PCP Herbie Baltimore to me the physician assistant from the practice at the [facility] clinic. She has been complaining of leg pain and has a wound on her right lower leg which was diagnosed as a venous stasis ulcer. Could not tolerate and Unna's boots because of pain. Last medical history significant for diabetes mellitus, asthma, DVT, hiatal hernia, hypertension, a medical hernia, cardiac catheterization, hysterectomy, breast biopsy. on 01/04/2016 right lower extremity DVT study was done and it was negative for a DVT. The patient  was found to have cellulitis of the right lower extremity and was given ceftriaxone injection in the office and put on Keflex 3 times a day and sent home with this on 02/24/16. The patient was recently admitted to hospital between October 9 and October 11 of 2017 for cellulitis of the right lower extremity with possible allergic reaction to Keflex. she was treated appropriately inpatient with vancomycin and Levaquin and then discharged home onclindamycin and other supportive care. The patient's husband says that she was seen by Dr. Lucky Cowboy a while ago and some tests were done but no procedures were done. 04/04/2016 -- neither he nor we have heard back from Dr. Bunnie Domino office and no reports are available nor has there been any appointment scheduled Lindsey English, Lindsey English ([id number]) Objective Constitutional Pulse regular. Respirations normal and unlabored. Afebrile. Vitals Time Taken: 10:01 AM, Height: 60 in, Weight: 131  lbs, BMI: 25.6, Temperature: 97.9 F, Pulse: 74 bpm, Respiratory Rate: 18 breaths/min, Blood Pressure: 190/64 mmHg. Eyes Nonicteric. Reactive to light. Ears, Nose, Mouth, and Throat Lips, teeth, and gums WNL.Marland Kitchen Moist mucosa without lesions. Neck supple and nontender. No palpable supraclavicular or cervical adenopathy. Normal sized without goiter. Respiratory WNL. No retractions.. Breath sounds WNL, No rubs, rales, rhonchi, or wheeze.. Cardiovascular Heart rhythm and rate regular, no murmur or gallop.. Pedal Pulses WNL. No clubbing, cyanosis or edema. Lymphatic No adneopathy. No adenopathy. No adenopathy. Musculoskeletal Adexa without tenderness or enlargement.. Digits and nails w/o clubbing, cyanosis, infection, petechiae, ischemia, or inflammatory conditions.Marland Kitchen Psychiatric Judgement and insight Intact.. No evidence of depression, anxiety, or agitation.. General Notes: the overall picture has improved significantly since last week and the fungal infection has abated. The wound  itself is less prominent and there is no cellulitis. No sharp debridement was required today. Integumentary (Hair, Skin) No suspicious lesions. No crepitus or fluctuance. No peri-wound warmth or erythema. No masses.. Wound #1 status is Open. Original cause of wound was Gradually Appeared. The wound is located on the Right,Medial Lower Leg. The wound measures 0.4cm length x 1cm width x 0.1cm depth; 0.314cm^2 area and 0.031cm^3 volume. The wound is limited to skin breakdown. There is no tunneling or undermining noted. There is a large amount of serous drainage noted. The wound margin is flat and intact. There is large (67-100%) red, pink granulation within the wound bed. There is no necrotic tissue within the wound bed. Lindsey English, Lindsey English ([id number]) The periwound skin appearance exhibited: Moist. The periwound skin appearance did not exhibit: Callus, Crepitus, Excoriation, Fluctuance, Friable, Induration, Localized Edema, Rash, Scarring, Dry/Scaly, Maceration, Atrophie Blanche, Cyanosis, Ecchymosis, Hemosiderin Staining, Mottled, Pallor, Rubor, Erythema. Periwound temperature was noted as No Abnormality. The periwound has tenderness on palpation. Assessment Active Problems ICD-10 E11.622 - Type 2 diabetes mellitus with other skin ulcer L97.211 - Non-pressure chronic ulcer of right calf limited to breakdown of skin B36.9 - Superficial mycosis, unspecified Plan Wound Cleansing: Wound #1 Right,Medial Lower Leg: Clean wound with Normal Saline. May Shower, gently pat wound dry prior to applying new dressing. Primary Wound Dressing: Wound #1 Right,Medial Lower Leg: Aquacel Ag - HHRN may use calcium alginate with silver Other: - antifungal cream to right lower leg Secondary Dressing: Wound #1 Right,Medial Lower Leg: Gauze, ABD and Kerlix/Conform - secure with netting and tape - no coban Dressing Change Frequency: Wound #1 Right,Medial Lower Leg: Change dressing every other day. Follow-up  Appointments: Wound #1 Right,Medial Lower Leg: Return Appointment in 1 week. Edema Control: Wound #1 Right,Medial Lower Leg: Elevate legs to the level of the heart and pump ankles as often as possible Home Health: Wound #1 Right,Medial Lower Leg: Continue Home Health Visits - [facility] please call [place] at [phone] and inform us of the correct [facility] agency so signed wound care orders can be faxed over [place] Nurse may visit PRN to address patient s wound care needs. Lindsey English, Lindsey English ([id number]) FACE TO FACE ENCOUNTER: MEDICARE and MEDICAID PATIENTS: I certify that this patient is under my care and that I had a face-to-face encounter that meets the physician face-to-face encounter requirements with this patient on this date. The encounter with the patient was in whole or in part for the following MEDICAL CONDITION: (primary reason for [place]) MEDICAL NECESSITY: I certify, that based on my findings, NURSING services are a medically necessary home health service. HOME BOUND STATUS: I certify that my clinical  findings support that this patient is homebound (i.e., Due to illness or injury, pt requires aid of supportive devices such as crutches, cane, wheelchairs, walkers, the use of special transportation or the assistance of another person to leave their place of residence. There is a normal inability to leave the home and doing so requires considerable and taxing effort. Other absences are for medical reasons / religious services and are infrequent or of short duration when for other reasons). If current dressing causes regression in wound condition, may D/C ordered dressing product/s and apply Normal Saline Moist Dressing daily until next [place] / Other MD appointment. [place] of regression in wound condition at [phone]. Please direct any NON-WOUND related issues/requests for orders to patient's Primary Care  Physician Medications-please add to medication list.: Other: - antifungal cream to right lower leg After review I have recommended: 1. Application of Lotrisone ointment on the areas which have florid fungal infection 2. Silver alginate and a light dressing with Kerlix 3. Elevation and exercise 4. Review by the vascular surgeons for possible venous reflux studies an arterial duplex studies -- appointment still pending 5. we'll see her back next week with review of her previous vascular notes. no Notes received yet Electronic Signature(s) Signed: 04/04/2016 10:48:33 AM By: Christin Fudge MD, FACS Entered By: Christin Fudge on 04/04/2016 10:48:33 Lindsey English, Lindsey English ([id number]) -------------------------------------------------------------------------------- SuperBill Details Patient Name: MARGUERITTE, UTTERBACK. Date of Service: 04/04/2016 Medical Record Number: [id number] Patient Account Number: [id number] Date of Birth/Sex: 1929-10-03 (82 y.o. Female) Treating RN: Montey Hora Primary Care Physician: Lynett Fish Other Clinician: Referring Physician: Lynett Fish Treating Physician/Extender: Frann Rider in Treatment: 1 Diagnosis Coding ICD-10 Codes Code Description [phone] Type 2 diabetes mellitus with other skin ulcer L97.211 Non-pressure chronic ulcer of right calf limited to breakdown of skin B36.9 Superficial mycosis, unspecified Facility Procedures CPT4 Code: [id number] Description: 99213 - WOUND CARE VISIT-LEV 3 EST PT Modifier: Quantity: 1 Physician Procedures CPT4 Code Description: [id number] - WC PHYS LEVEL 3 - EST PT ICD-10 Description Diagnosis E11.622 Type 2 diabetes mellitus with other skin ulcer L97.211 Non-pressure chronic ulcer of right calf limited to B36.9 Superficial mycosis, unspecified Modifier: breakdown of Quantity: 1 skin Electronic Signature(s) Signed: 04/04/2016 10:48:45 AM By: Christin Fudge MD, FACS Entered By: Christin Fudge on 04/04/2016  10:48:44

## 2016-04-11 ENCOUNTER — Encounter: Payer: Medicare Other | Admitting: Surgery

## 2016-04-11 DIAGNOSIS — E11622 Type 2 diabetes mellitus with other skin ulcer: Secondary | ICD-10-CM | POA: Diagnosis not present

## 2016-04-12 NOTE — Progress Notes (Addendum)
Lindsey English ([id number]) Visit Report for 04/11/2016 Arrival Information Details Patient Name: Lindsey English, Lindsey English. Date of Service: 04/11/2016 10:00 AM Medical Record Number: [id number] Patient Account Number: [id number] Date of Birth/Sex: 10/11/1929 (82 y.o. Female) Treating RN: Montey Hora Primary Care Physician: Lynett Fish Other Clinician: Referring Physician: Lynett Fish Treating Physician/Extender: Frann Rider in Treatment: 2 Visit Information History Since Last Visit Added or deleted any medications: No Patient Arrived: Walker Any new allergies or adverse reactions: No Arrival Time: 10:32 Had a fall or experienced change in No Accompanied By: spouse activities of daily living that may affect Transfer Assistance: None risk of falls: Patient Identification Verified: Yes Signs or symptoms of abuse/neglect since last No Secondary Verification Process Yes visito Completed: Hospitalized since last visit: No Patient Has Alerts: Yes Pain Present Now: No Patient Alerts: ABI [place] BILATERAL >220 Electronic Signature(s) Signed: 04/11/2016 5:38:29 PM By: Montey Hora Entered By: Montey Hora on 04/11/2016 10:32:49 Lindsey English ([id number]) -------------------------------------------------------------------------------- Clinic Level of Care Assessment Details Patient Name: Lindsey English. Date of Service: 04/11/2016 10:00 AM Medical Record Number: [id number] Patient Account Number: [id number] Date of Birth/Sex: 12-Jan-1930 (82 y.o. Female) Treating RN: Montey Hora Primary Care Physician: Lynett Fish Other Clinician: Referring Physician: Lynett Fish Treating Physician/Extender: Frann Rider in Treatment: 2 Clinic Level of Care Assessment Items TOOL 4 Quantity Score []  - Use when only an EandM is performed on FOLLOW-UP visit 0 ASSESSMENTS - Nursing Assessment / Reassessment X - Reassessment of Co-morbidities (includes updates in  patient status) 1 10 X - Reassessment of Adherence to Treatment Plan 1 5 ASSESSMENTS - Wound and Skin Assessment / Reassessment X - Simple Wound Assessment / Reassessment - one wound 1 5 []  - Complex Wound Assessment / Reassessment - multiple wounds 0 []  - Dermatologic / Skin Assessment (not related to wound area) 0 ASSESSMENTS - Focused Assessment []  - Circumferential Edema Measurements - multi extremities 0 []  - Nutritional Assessment / Counseling / Intervention 0 X - Lower Extremity Assessment (monofilament, tuning fork, pulses) 1 5 []  - Peripheral Arterial Disease Assessment (using hand held doppler) 0 ASSESSMENTS - Ostomy and/or Continence Assessment and Care []  - Incontinence Assessment and Management 0 []  - Ostomy Care Assessment and Management (repouching, etc.) 0 PROCESS - Coordination of Care X - Simple Patient / Family Education for ongoing care 1 15 []  - Complex (extensive) Patient / Family Education for ongoing care 0 []  - Staff obtains Programmer, systems, Records, Test Results / Process Orders 0 []  - Staff telephones HHA, Nursing Homes / Clarify orders / etc 0 []  - Routine Transfer to another Facility (non-emergent condition) 0 Lindsey English, Lindsey English ([id number]) []  - Routine Hospital Admission (non-emergent condition) 0 []  - New Admissions / Biomedical engineer / Ordering NPWT, Apligraf, etc. 0 []  - Emergency Hospital Admission (emergent condition) 0 X - Simple Discharge Coordination 1 10 []  - Complex (extensive) Discharge Coordination 0 PROCESS - Special Needs []  - Pediatric / Minor Patient Management 0 []  - Isolation Patient Management 0 []  - Hearing / Language / Visual special needs 0 []  - Assessment of Community assistance (transportation, D/C planning, etc.) 0 []  - Additional assistance / Altered mentation 0 []  - Support Surface(s) Assessment (bed, cushion, seat, etc.) 0 INTERVENTIONS - Wound Cleansing / Measurement X - Simple Wound Cleansing - one wound 1 5 []  - Complex  Wound Cleansing - multiple wounds 0 X - Wound Imaging (photographs - any number of wounds) 1 5 []  - Wound Tracing (  instead of photographs) 0 X - Simple Wound Measurement - one wound 1 5 []  - Complex Wound Measurement - multiple wounds 0 INTERVENTIONS - Wound Dressings []  - Small Wound Dressing one or multiple wounds 0 X - Medium Wound Dressing one or multiple wounds 1 15 []  - Large Wound Dressing one or multiple wounds 0 []  - Application of Medications - topical 0 []  - Application of Medications - injection 0 INTERVENTIONS - Miscellaneous []  - External ear exam 0 Lindsey English, Lindsey English ([id number]) []  - Specimen Collection (cultures, biopsies, blood, body fluids, etc.) 0 []  - Specimen(s) / Culture(s) sent or taken to Lab for analysis 0 []  - Patient Transfer (multiple staff / Harrel Lemon Lift / Similar devices) 0 []  - Simple Staple / Suture removal (25 or less) 0 []  - Complex Staple / Suture removal (26 or more) 0 []  - Hypo / Hyperglycemic Management (close monitor of Blood Glucose) 0 []  - Ankle / Brachial Index (ABI) - do not check if billed separately 0 X - Vital Signs 1 5 Has the patient been seen at the hospital within the last three years: Yes Total Score: 85 Level Of Care: New/Established - Level 3 Electronic Signature(s) Signed: 04/11/2016 5:38:29 PM By: Montey Hora Entered By: Montey Hora on 04/11/2016 12:44:18 Lindsey English ([id number]) -------------------------------------------------------------------------------- Encounter Discharge Information Details Patient Name: Lindsey English. Date of Service: 04/11/2016 10:00 AM Medical Record Number: [id number] Patient Account Number: [id number] Date of Birth/Sex: 12/28/1929 (82 y.o. Female) Treating RN: Montey Hora Primary Care Physician: Lynett Fish Other Clinician: Referring Physician: Lynett Fish Treating Physician/Extender: Frann Rider in Treatment: 2 Encounter Discharge Information Items Discharge Pain  Level: 0 Discharge Condition: Stable Ambulatory Status: Walker Discharge Destination: Home Transportation: Private Auto Accompanied By: spouse Schedule Follow-up Appointment: Yes Medication Reconciliation completed and provided to Patient/Care No Alson Mcpheeters: Provided on Clinical Summary of Care: 04/11/2016 Form Type Recipient Paper Patient [facility] Electronic Signature(s) Signed: 04/11/2016 5:05:53 PM By: Montey Hora Previous Signature: 04/11/2016 11:15:19 AM Version By: Ruthine Dose Entered By: Montey Hora on 04/11/2016 17:05:53 Lindsey English ([id number]) -------------------------------------------------------------------------------- Lower Extremity Assessment Details Patient Name: Lindsey English. Date of Service: 04/11/2016 10:00 AM Medical Record Number: [id number] Patient Account Number: [id number] Date of Birth/Sex: 18-Jan-1930 (82 y.o. Female) Treating RN: Montey Hora Primary Care Physician: Lynett Fish Other Clinician: Referring Physician: Lynett Fish Treating Physician/Extender: Frann Rider in Treatment: 2 Edema Assessment Assessed: [Left: No] [Right: No] Edema: [Left: Ye] [Right: s] Calf Left: Right: Point of Measurement: 30 cm From Medial Instep cm cm Ankle Left: Right: Point of Measurement: 10 cm From Medial Instep cm cm Vascular Assessment Pulses: Posterior Tibial Dorsalis Pedis Palpable: [Right:Yes] Extremity colors, hair growth, and conditions: Extremity Color: [Right:Red] Hair Growth on Extremity: [Right:No] Temperature of Extremity: [Right:Warm] Capillary Refill: [Right:< 3 seconds] Electronic Signature(s) Signed: 04/11/2016 5:38:29 PM By: Montey Hora Entered By: Montey Hora on 04/11/2016 10:39:56 Lindsey English ([id number]) -------------------------------------------------------------------------------- Multi Wound Chart Details Patient Name: Lindsey English. Date of Service: 04/11/2016 10:00 AM Medical Record  Number: [id number] Patient Account Number: [id number] Date of Birth/Sex: [birth year] (82 y.o. Female) Treating RN: Montey Hora Primary Care Physician: Lynett Fish Other Clinician: Referring Physician: Lynett Fish Treating Physician/Extender: Frann Rider in Treatment: 2 Vital Signs Height(in): 60 Pulse(bpm): 84 Weight(lbs): 131 Blood Pressure 160/62 (mmHg): Body Mass Index(BMI): 26 Temperature(F): 97.8 Respiratory Rate 16 (breaths/min): Photos: [N/A:N/A] Wound Location: Right Lower Leg - Medial N/A N/A Wounding Event: Gradually Appeared N/A N/A  Primary Etiology: Venous Leg Ulcer N/A N/A Comorbid History: Cataracts, Asthma, Deep N/A N/A Vein Thrombosis, Hypertension, Type II Diabetes Date Acquired: 02/22/2016 N/A N/A Weeks of Treatment: 2 N/A N/A Wound Status: Open N/A N/A Measurements L x W x D 0.4x0.8x0.1 N/A N/A (cm) Area (cm) : 0.251 N/A N/A Volume (cm) : 0.025 N/A N/A % Reduction in Area: 96.30% N/A N/A % Reduction in Volume: 98.20% N/A N/A Classification: Full Thickness Without N/A N/A Exposed Support Structures HBO Classification: Grade 1 N/A N/A Exudate Amount: Large N/A N/A Exudate Type: Serous N/A N/A Exudate Color: amber N/A N/A Lindsey English, Lindsey English ([id number]) Wound Margin: Flat and Intact N/A N/A Granulation Amount: Large (67-100%) N/A N/A Granulation Quality: Red, Pink N/A N/A Necrotic Amount: None Present (0%) N/A N/A Exposed Structures: Fascia: No N/A N/A Fat: No Tendon: No Muscle: No Joint: No Bone: No Limited to Skin Breakdown Epithelialization: Medium (34-66%) N/A N/A Periwound Skin Texture: Edema: No N/A N/A Excoriation: No Induration: No Callus: No Crepitus: No Fluctuance: No Friable: No Rash: No Scarring: No Periwound Skin Moist: Yes N/A N/A Moisture: Maceration: No Dry/Scaly: No Periwound Skin Color: Atrophie Blanche: No N/A N/A Cyanosis: No Ecchymosis: No Erythema: No Hemosiderin Staining: No Mottled:  No Pallor: No Rubor: No Temperature: No Abnormality N/A N/A Tenderness on Yes N/A N/A Palpation: Wound Preparation: Ulcer Cleansing: N/A N/A Rinsed/Irrigated with Saline Topical Anesthetic Applied: Other: lidocaine 4% Treatment Notes Electronic Signature(s) Signed: 04/11/2016 5:38:29 PM By: Montey Hora Entered By: Montey Hora on 04/11/2016 10:58:34 Lindsey English, Lindsey English ([id number], Lindsey English ([id number]) -------------------------------------------------------------------------------- [place] Details Patient Name: Lindsey English, Lindsey English. Date of Service: 04/11/2016 10:00 AM Medical Record Number: [id number] Patient Account Number: [id number] Date of Birth/Sex: 12/04/1929 (82 y.o. Female) Treating RN: Montey Hora Primary Care Physician: Lynett Fish Other Clinician: Referring Physician: Lynett Fish Treating Physician/Extender: Frann Rider in Treatment: 2 Active Inactive Electronic Signature(s) Signed: 05/03/2016 1:38:37 PM By: Gretta Cool RN, BSN, Kim RN, BSN Signed: 05/10/2016 4:42:26 PM By: Montey Hora Previous Signature: 04/11/2016 5:38:29 PM Version By: Montey Hora Entered By: Gretta Cool RN, BSN, Kim on 05/02/2016 17:33:39 Lindsey English, Lindsey English ([id number]) -------------------------------------------------------------------------------- Pain Assessment Details Patient Name: Lindsey English, Lindsey English. Date of Service: 04/11/2016 10:00 AM Medical Record Number: [id number] Patient Account Number: [id number] Date of Birth/Sex: 02-Mar-1930 (82 y.o. Female) Treating RN: Montey Hora Primary Care Physician: Lynett Fish Other Clinician: Referring Physician: Lynett Fish Treating Physician/Extender: Frann Rider in Treatment: 2 Active Problems Location of Pain Severity and Description of Pain Patient Has Paino No Site Locations Pain Management and Medication Current Pain Management: Goals for Pain Management patient reports  generalized itching in both arms and legs but no pain Notes Topical or injectable lidocaine is offered to patient for acute pain when surgical debridement is performed. If needed, Patient is instructed to use over the counter pain medication for the following 24-48 hours after debridement. Wound care MDs do not prescribed pain medications. Patient has chronic pain or uncontrolled pain. Patient has been instructed to make an appointment with their Primary Care Physician for pain management. Electronic Signature(s) Signed: 04/11/2016 5:38:29 PM By: Montey Hora Entered By: Montey Hora on 04/11/2016 10:34:44 Lindsey English ([id number]) -------------------------------------------------------------------------------- Patient/Caregiver Education Details Patient Name: Lindsey English, Lindsey English. Date of Service: 04/11/2016 10:00 AM Medical Record Number: [id number] Patient Account Number: [id number] Date of Birth/Gender: 11/07/1929 (82 y.o. Female) Treating RN: Montey Hora Primary Care Physician: Lynett Fish Other Clinician: Referring Physician: Lynett Fish Treating Physician/Extender: Christin Fudge  Weeks in Treatment: 2 Education Assessment Education Provided To: Patient Education Topics Provided Wound/Skin Impairment: Handouts: Other: wound care as ordered Methods: Demonstration, Explain/Verbal Responses: State content correctly Electronic Signature(s) Signed: 04/11/2016 5:38:29 PM By: Montey Hora Entered By: Montey Hora on 04/11/2016 17:06:20 Lindsey English ([id number]) -------------------------------------------------------------------------------- Wound Assessment Details Patient Name: Lindsey English. Date of Service: 04/11/2016 10:00 AM Medical Record Number: [id number] Patient Account Number: [id number] Date of Birth/Sex: 04-27-1930 (82 y.o. Female) Treating RN: Montey Hora Primary Care Physician: Lynett Fish Other Clinician: Referring Physician: Lynett Fish Treating Physician/Extender: Frann Rider in Treatment: 2 Wound Status Wound Number: 1 Primary Venous Leg Ulcer Etiology: Wound Location: Right Lower Leg - Medial Wound Open Wounding Event: Gradually Appeared Status: Date Acquired: 02/22/2016 Comorbid Cataracts, Asthma, Deep Vein Weeks Of Treatment: 2 History: Thrombosis, Hypertension, Type II Clustered Wound: No Diabetes Photos Wound Measurements Length: (cm) 0.4 % Reduction i Width: (cm) 0.8 % Reduction i Depth: (cm) 0.1 Epithelializa Area: (cm) 0.251 Tunneling: Volume: (cm) 0.025 Undermining: n Area: 96.3% n Volume: 98.2% tion: Medium (34-66%) No No Wound Description Full Thickness Without Classification: Exposed Support Structures Diabetic Severity Grade 1 (Wagner): Wound Margin: Flat and Intact Exudate Amount: Large Exudate Type: Serous Exudate Color: amber Foul Odor After Cleansing: No Wound Bed Granulation Amount: Large (67-100%) Exposed Structure Granulation Quality: Red, Pink Fascia Exposed: No Lindsey English, Lindsey English. ([id number]) Necrotic Amount: None Present (0%) Fat Layer Exposed: No Tendon Exposed: No Muscle Exposed: No Joint Exposed: No Bone Exposed: No Limited to Skin Breakdown Periwound Skin Texture Texture Color No Abnormalities Noted: No No Abnormalities Noted: No Callus: No Atrophie Blanche: No Crepitus: No Cyanosis: No Excoriation: No Ecchymosis: No Fluctuance: No Erythema: No Friable: No Hemosiderin Staining: No Induration: No Mottled: No Localized Edema: No Pallor: No Rash: No Rubor: No Scarring: No Temperature / Pain Moisture Temperature: No Abnormality No Abnormalities Noted: No Tenderness on Palpation: Yes Dry / Scaly: No Maceration: No Moist: Yes Wound Preparation Ulcer Cleansing: Rinsed/Irrigated with Saline Topical Anesthetic Applied: Other: lidocaine 4%, Electronic Signature(s) Signed: 04/11/2016 5:38:29 PM By: Montey Hora Entered By:  Montey Hora on 04/11/2016 10:43:46 Lindsey English ([id number]) -------------------------------------------------------------------------------- Vitals Details Patient Name: Lindsey English, HILTUNEN. Date of Service: 04/11/2016 10:00 AM Medical Record Number: [id number] Patient Account Number: [id number] Date of Birth/Sex: 1929/07/31 (82 y.o. Female) Treating RN: Montey Hora Primary Care Physician: Lynett Fish Other Clinician: Referring Physician: Lynett Fish Treating Physician/Extender: Frann Rider in Treatment: 2 Vital Signs Time Taken: 10:34 Temperature (F): 97.8 Height (in): 60 Pulse (bpm): 84 Weight (lbs): 131 Respiratory Rate (breaths/min): 16 Body Mass Index (BMI): 25.6 Blood Pressure (mmHg): 160/62 Reference Range: 80 - 120 mg / dl Electronic Signature(s) Signed: 04/11/2016 5:38:29 PM By: Montey Hora Entered By: Montey Hora on 04/11/2016 10:35:17

## 2016-04-12 NOTE — Progress Notes (Signed)
Lindsey English, Lindsey English ([id number]) Visit Report for 04/11/2016 Chief Complaint Document Details Patient Name: Lindsey English, Lindsey English 04/11/2016 10:00 Date of Service: AM Medical Record [id number] Number: Patient Account Number: [id number] May 11, 1930 (82 Lindsey English.o. Treating RN: Montey Hora Date of Birth/Sex: Female) Other Clinician: Primary Care Physician: Cristal Deer Referring Physician: Lynett Fish Physician/Extender: Suella Grove in Treatment: 2 Information Obtained from: Patient Chief Complaint Patients presents for treatment of an open diabetic ulcer with swelling of the right lower extremity which she's had for about a month Electronic Signature(s) Signed: 04/11/2016 11:15:30 AM By: Christin Fudge MD, FACS Entered By: Christin Fudge on 04/11/2016 11:15:30 Lindsey English ([id number]) -------------------------------------------------------------------------------- HPI Details Patient Name: Lindsey English 04/11/2016 10:00 Date of Service: AM Medical Record [id number] Number: Patient Account Number: [id number] 1929-09-13 (26 Lindsey English.o. Treating RN: Montey Hora Date of Birth/Sex: Female) Other Clinician: Primary Care Physician: Cristal Deer Referring Physician: Lynett Fish Physician/Extender: Suella Grove in Treatment: 2 History of Present Illness Location: redness, swelling and pain of the right lower extremity Quality: Patient reports experiencing heaviness to affected area(s). Severity: Patient states wound are getting worse. Duration: Patient has had the wound for < 4 weeks prior to presenting for treatment Timing: Pain in wound is Intermittent (comes and goes Context: The wound appeared gradually over time Modifying Factors: Other treatment(s) tried include: admitted to the hospital for IV antibiotics and symptomatic treatment Associated Signs and Symptoms: Patient reports having increase swelling. HPI Description: 82 year old  patient sent to as by her PCP Lindsey English to me the physician assistant from the practice at the [facility]. She has been complaining of leg pain and has a wound on her right lower leg which was diagnosed as a venous stasis ulcer. Could not tolerate and Unna's boots because of pain. Last medical history significant for diabetes mellitus, asthma, DVT, hiatal hernia, hypertension, a medical hernia, cardiac catheterization, hysterectomy, breast biopsy. on 01/04/2016 right lower extremity DVT study was done and it was negative for a DVT. The patient was found to have cellulitis of the right lower extremity and was given ceftriaxone injection in the office and put on Keflex 3 times a day and sent home with this on 02/24/16. The patient was recently admitted to hospital between October 9 and October 11 of 2017 for cellulitis of the right lower extremity with possible allergic reaction to Keflex. she was treated appropriately inpatient with vancomycin and Levaquin and then discharged home onclindamycin and other supportive care. The patient's husband says that she was seen by Dr. Lucky English a while ago and some tests were done but no procedures were done. 04/04/2016 -- neither he nor we have heard back from Dr. Bunnie English office and no reports are available nor has there been any appointment scheduled. 04/11/2016 -- the patient and her husband do not seem to have done anything about getting an appointment to see the vascular office yet. I have reiterated the importance of this. Electronic Signature(s) Signed: 04/11/2016 11:16:15 AM By: Christin Fudge MD, FACS Entered By: Christin Fudge on 04/11/2016 11:16:14 Lindsey English, Lindsey English ([id number]) -------------------------------------------------------------------------------- Physical Exam Details Patient Name: Lindsey English, Lindsey English 04/11/2016 10:00 Date of Service: AM Medical Record [id number] Number: Patient Account Number: [id number] [birth year] (82 Lindsey English.o. Treating RN: Montey Hora Date of Birth/Sex: Female) Other Clinician: Primary Care Physician: Cristal Deer Referring Physician: Lynett Fish Physician/Extender: Weeks in Treatment: 2 Constitutional . Pulse regular. Respirations normal and unlabored. Afebrile. . Eyes Nonicteric. Reactive to light.  Ears, Nose, Mouth, and Throat Lips, teeth, and gums WNL.Marland Kitchen Moist mucosa without lesions. Neck supple and nontender. No palpable supraclavicular or cervical adenopathy. Normal sized without goiter. Respiratory WNL. No retractions.. Breath sounds WNL, No rubs, rales, rhonchi, or wheeze.. Cardiovascular Heart rhythm and rate regular, no murmur or gallop.. Pedal Pulses WNL. No clubbing, cyanosis or edema. Chest Breasts symmetical and no nipple discharge.. Breast tissue WNL, no masses, lumps, or tenderness.. Lymphatic No adneopathy. No adenopathy. No adenopathy. Musculoskeletal Adexa without tenderness or enlargement.. Digits and nails w/o clubbing, cyanosis, infection, petechiae, ischemia, or inflammatory conditions.. Integumentary (Hair, Skin) No suspicious lesions. No crepitus or fluctuance. No peri-wound warmth or erythema. No masses.Marland Kitchen Psychiatric Judgement and insight Intact.. No evidence of depression, anxiety, or agitation.. Notes the fungal infection has gone down completely and no further antifungal will be required. The wound itself is shallow but a bit dry and I will change the recommendations for dressing Electronic Signature(s) Signed: 04/11/2016 11:16:58 AM By: Christin Fudge MD, FACS Entered By: Christin Fudge on 04/11/2016 11:16:57 Lindsey English ([id number]) -------------------------------------------------------------------------------- Physician Orders Details Patient Name: [name], [name] 04/11/2016 10:00 Date of Service: AM Medical Record [id number] Number: Patient Account Number: [id number] [birth year] (82 Lindsey English.o. Treating RN: Montey Hora Date of  Birth/Sex: Female) Other Clinician: Primary Care Physician: Cristal Deer Referring Physician: Lynett Fish Physician/Extender: Suella Grove in Treatment: 2 Verbal / Phone Orders: Yes Clinician: Montey Hora Read Back and Verified: Yes Diagnosis Coding Wound Cleansing Wound #1 Right,Medial Lower Leg o Clean wound with Normal Saline. o May Shower, gently pat wound dry prior to applying new dressing. Primary Wound Dressing Wound #1 Right,Medial Lower Leg o Hydrogel o Other: - sorbact - given to patient Secondary Dressing Wound #1 Right,Medial Lower Leg o Gauze, ABD and Kerlix/Conform - secure with netting and tape - no coban Dressing Change Frequency Wound #1 Right,Medial Lower Leg o Change dressing every other day. Follow-up Appointments Wound #1 Right,Medial Lower Leg o Return Appointment in 1 week. Edema Control Wound #1 Right,Medial Lower Leg o Elevate legs to the level of the heart and pump ankles as often as possible Home Health Wound #1 Right,Medial Lower Leg o [place] Visits - [facility] please call [place] at [phone] and inform us of the correct [facility] agency so signed wound care orders can be faxed over [facility] Nurse may visit PRN to address patientos wound care needs. o FACE TO FACE ENCOUNTER: MEDICARE and MEDICAID PATIENTS: I certify that this patient is under my care and that I had a face-to-face encounter that meets the physician face-to-face Lindsey English, Lindsey English ([id number]) encounter requirements with this patient on this date. The encounter with the patient was in whole or in part for the following MEDICAL CONDITION: (primary reason for [place]) MEDICAL NECESSITY: I certify, that based on my findings, NURSING services are a medically necessary home health service. HOME BOUND STATUS: I certify that my clinical findings support that this patient is homebound (i.e., Due to illness  or injury, pt requires aid of supportive devices such as crutches, cane, wheelchairs, walkers, the use of special transportation or the assistance of another person to leave their place of residence. There is a normal inability to leave the home and doing so requires considerable and taxing effort. Other absences are for medical reasons / religious services and are infrequent or of short duration when for other reasons). o If current dressing causes regression in wound condition, may  D/C ordered dressing product/s and apply Normal Saline Moist Dressing daily until next [place] / Other MD appointment. [place] of regression in wound condition at [phone]. o Please direct any NON-WOUND related issues/requests for orders to patient's Primary Care Physician Consults o Vascular Electronic Signature(s) Signed: 04/11/2016 4:55:45 PM By: Christin Fudge MD, FACS Signed: 04/11/2016 5:38:29 PM By: Montey Hora Entered By: Montey Hora on 04/11/2016 11:00:17 Lindsey English ([id number]) -------------------------------------------------------------------------------- Problem List Details Patient Name: AMARYS, VENTERS 04/11/2016 10:00 Date of Service: AM Medical Record [id number] Number: Patient Account Number: [id number] Apr 29, 1930 (30 Lindsey English.o. 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 Treating RN: Montey Hora Date of Birth/Sex: Female) Other Clinician: Primary Care Physician: Blanche East, Deandrae Wajda Referring Physician: Lynett Fish Physician/Extender: Suella Grove in Treatment: 2 Subjective Chief Complaint Information obtained from Patient Patients presents for treatment of an open diabetic ulcer with swelling of the right lower extremity which she's had for about a month History of Present Illness (HPI) The following HPI elements were documented for the patient's wound: Location: redness, swelling and pain of the right lower extremity Quality: Patient reports experiencing heaviness to affected area(s). Severity: Patient states wound are getting worse. Duration: Patient has had the wound for < 4 weeks prior to presenting for treatment Timing: Pain in wound is Intermittent (comes and goes Context: The wound appeared gradually over time Modifying Factors: Other treatment(s) tried include: admitted to the hospital for IV antibiotics and symptomatic treatment Associated Signs and Symptoms: Patient reports having increase swelling. 82 year old patient sent to as by her PCP Lindsey English to me the physician assistant from the practice at the nodal clinic. She has been complaining of leg pain and has a wound on her right lower leg which was diagnosed as a venous stasis ulcer. Could not tolerate and Unna's boots because of pain. Last medical history significant for diabetes mellitus, asthma, DVT, hiatal hernia, hypertension, a medical hernia, cardiac catheterization, hysterectomy, breast biopsy. on 01/04/2016 right  lower extremity DVT study was done and it was negative for a DVT. The patient was found to have cellulitis of the right lower extremity and was given ceftriaxone injection in the office and put on Keflex 3 times a day and sent home with this on 02/24/16. The patient was recently admitted to hospital between October 9 and October 11 of 2017 for cellulitis of the right lower extremity with possible allergic reaction to Keflex. she was treated appropriately inpatient with vancomycin and Levaquin and then discharged home onclindamycin and other supportive care. The patient's husband says that she was seen by Dr. Lucky English a while ago and some tests were done but no procedures were done. 04/04/2016 -- neither he nor we have heard back from Dr. Bunnie English office and no reports are available nor has there been any appointment  scheduled. 04/11/2016 -- the patient and her husband do not seem to have done anything about getting an appointment to see the vascular office yet. I have reiterated the importance of this. Lindsey English, Lindsey English ([id number]) Objective Constitutional Pulse regular. Respirations normal and unlabored. Afebrile. Vitals Time Taken: 10:34 AM, Height: 60 in, Weight: 131 lbs, BMI: 25.6, Temperature: 97.8 F, Pulse: 84 bpm, Respiratory Rate: 16 breaths/min, Blood Pressure: 160/62 mmHg. Eyes Nonicteric. Reactive to light. Ears, Nose, Mouth, and Throat Lips, teeth, and gums WNL.Marland Kitchen Moist mucosa without lesions. Neck supple and nontender. No palpable supraclavicular or cervical adenopathy. Normal sized without goiter. Respiratory WNL. No retractions.. Breath sounds WNL, No rubs, rales, rhonchi, or wheeze.. Cardiovascular Heart rhythm and rate regular, no murmur or gallop.. Pedal Pulses WNL. No clubbing, cyanosis or edema. Chest Breasts symmetical and no nipple discharge.. Breast tissue WNL, no masses, lumps, or tenderness.. Lymphatic No adneopathy. No adenopathy. No adenopathy. Musculoskeletal Adexa  without tenderness or enlargement.. Digits and nails w/o clubbing, cyanosis, infection, petechiae, ischemia, or inflammatory conditions.Marland Kitchen Psychiatric Judgement and insight Intact.. No evidence of depression, anxiety, or agitation.. General Notes: the fungal infection has gone down completely and no further antifungal will be required. The wound itself is shallow but a bit dry and I will change the recommendations for dressing Integumentary (Hair, Skin) No suspicious lesions. No crepitus or fluctuance. No peri-wound warmth or erythema. No masses.. Wound #1 status is Open. Original cause of wound was Gradually Appeared. The wound is located on the Right,Medial Lower Leg. The wound measures 0.4cm length x 0.8cm width x 0.1cm depth; 0.251cm^2 area and 0.025cm^3 volume. The wound is limited to skin breakdown. There is no tunneling or undermining Lindsey English, Lindsey English. ([id number]) noted. There is a large amount of serous drainage noted. The wound margin is flat and intact. There is large (67-100%) red, pink granulation within the wound bed. There is no necrotic tissue within the wound bed. The periwound skin appearance exhibited: Moist. The periwound skin appearance did not exhibit: Callus, Crepitus, Excoriation, Fluctuance, Friable, Induration, Localized Edema, Rash, Scarring, Dry/Scaly, Maceration, Atrophie Blanche, Cyanosis, Ecchymosis, Hemosiderin Staining, Mottled, Pallor, Rubor, Erythema. Periwound temperature was noted as No Abnormality. The periwound has tenderness on palpation. Assessment Active Problems ICD-10 E11.622 - Type 2 diabetes mellitus with other skin ulcer L97.211 - Non-pressure chronic ulcer of right calf limited to breakdown of skin B36.9 - Superficial mycosis, unspecified Plan Wound Cleansing: Wound #1 Right,Medial Lower Leg: Clean wound with Normal Saline. May Shower, gently pat wound dry prior to applying new dressing. Primary Wound Dressing: Wound #1 Right,Medial Lower  Leg: Hydrogel Other: - sorbact - given to patient Secondary Dressing: Wound #1 Right,Medial Lower Leg: Gauze, ABD and Kerlix/Conform - secure with netting and tape - no coban Dressing Change Frequency: Wound #1 Right,Medial Lower Leg: Change dressing every other day. Follow-up Appointments: Wound #1 Right,Medial Lower Leg: Return Appointment in 1 week. Edema Control: Wound #1 Right,Medial Lower Leg: Elevate legs to the level of the heart and pump ankles as often as possible Home Health: Wound #1 Right,Medial Lower Leg: [place] Visits - [facility] please call [place] at [phone] and YICHEN, COTRELL ([id number]) inform us of the correct [facility] agency so signed wound care orders can be faxed over [place] Nurse may visit PRN to address patient s wound care needs. FACE TO FACE ENCOUNTER: MEDICARE and MEDICAID PATIENTS: I certify that this patient is under my care and that I had a face-to-face encounter that  meets the physician face-to-face encounter requirements with this patient on this date. The encounter with the patient was in whole or in part for the following MEDICAL CONDITION: (primary reason for [place]) MEDICAL NECESSITY: I certify, that based on my findings, NURSING services are a medically necessary home health service. HOME BOUND STATUS: I certify that my clinical findings support that this patient is homebound (i.e., Due to illness or injury, pt requires aid of supportive devices such as crutches, cane, wheelchairs, walkers, the use of special transportation or the assistance of another person to leave their place of residence. There is a normal inability to leave the home and doing so requires considerable and taxing effort. Other absences are for medical reasons / religious services and are infrequent or of short duration when for other reasons). If current dressing causes regression in wound condition, may D/C ordered dressing product/s  and apply Normal Saline Moist Dressing daily until next [place] / Other MD appointment. [place] of regression in wound condition at [phone]. Please direct any NON-WOUND related issues/requests for orders to patient's Primary Care Physician Consults ordered were: Vascular After review I have recommended: 1. Stop application of Lotrisone ointment. 2. hydrogel and sorbact and a light dressing with Kerlix 3. Elevation and exercise 4. Review by the vascular surgeons for possible venous reflux studies an arterial duplex studies -- appointment still pending, and I have urged them to follow up with the vascular office. Electronic Signature(s) Signed: 04/11/2016 11:18:29 AM By: Christin Fudge MD, FACS Entered By: Christin Fudge on 04/11/2016 11:18:29 Lindsey English ([id number]) -------------------------------------------------------------------------------- SuperBill Details Patient Name: SAPHIRE, BUFF. Date of Service: 04/11/2016 Medical Record Number: [id number] Patient Account Number: [id number] Date of Birth/Sex: [birth year] (82 Lindsey English.o. Female) Treating RN: Montey Hora Primary Care Physician: Lynett Fish Other Clinician: Referring Physician: Lynett Fish Treating Physician/Extender: Frann Rider in Treatment: 2 Diagnosis Coding ICD-10 Codes Code Description [phone] Type 2 diabetes mellitus with other skin ulcer L97.211 Non-pressure chronic ulcer of right calf limited to breakdown of skin B36.9 Superficial mycosis, unspecified Physician Procedures CPT4 Code Description: [id number] - WC PHYS LEVEL 3 - EST PT ICD-10 Description Diagnosis E11.622 Type 2 diabetes mellitus with other skin ulcer L97.211 Non-pressure chronic ulcer of right calf limited to B36.9 Superficial mycosis, unspecified Modifier: breakdown of Quantity: 1 skin Electronic Signature(s) Signed: 04/11/2016 11:18:50 AM By: Christin Fudge MD, FACS Entered By: Christin Fudge on 04/11/2016 11:18:47

## 2016-04-18 ENCOUNTER — Ambulatory Visit: Payer: Medicare Other | Admitting: Surgery

## 2016-04-20 ENCOUNTER — Encounter (INDEPENDENT_AMBULATORY_CARE_PROVIDER_SITE_OTHER): Payer: Self-pay | Admitting: Vascular Surgery

## 2016-04-20 ENCOUNTER — Ambulatory Visit (INDEPENDENT_AMBULATORY_CARE_PROVIDER_SITE_OTHER): Payer: Medicare Other | Admitting: Vascular Surgery

## 2016-04-20 DIAGNOSIS — L97321 Non-pressure chronic ulcer of left ankle limited to breakdown of skin: Secondary | ICD-10-CM | POA: Diagnosis not present

## 2016-04-20 DIAGNOSIS — L97311 Non-pressure chronic ulcer of right ankle limited to breakdown of skin: Secondary | ICD-10-CM | POA: Diagnosis not present

## 2016-04-20 DIAGNOSIS — I890 Lymphedema, not elsewhere classified: Secondary | ICD-10-CM | POA: Diagnosis not present

## 2016-04-20 NOTE — Progress Notes (Signed)
Subjective:    Patient ID: Lindsey English, female    DOB: 1929/08/15, 82 y.o.   MRN: [id number] Chief Complaint  Patient presents with  . Follow-up   Patient referred back to our office by Dr. Con Memos from [place]. The patient endorses a history of non-healing ulcerations located on the medical and lateral aspects of her bilateral ankles. States the ulcers have been present for "months". They are slow healing and at times drain a "yellow fluid". She does not wear compression stockings as they are "too uncomfortable". She does not engage in elevation of her lower extremity. She has tried unna wraps in the past however stopped due to "scabs being picked off by staff hurt too much". Today, she states her ulcers are "doing well". They are not draining and seem to be "healing". She was last seen in our office on 04/10/14 and underwent an ABI which showed Right ABI: >1.3 and Left >1.3 - great toe pressure and PPG waveforms within normal limits (on 12/17/12, Right ABI: 0.99 and Left 0.96). On 12/17/12, the patient underwent a right lower extremity venous duplex which was negative for venous reflux. At this point, she was diagnosed with lymphedema and instructed to wear compression and engage in elevation of her lower extremity.    Review of Systems  Constitutional: Negative.   HENT: Negative.   Eyes: Negative.   Respiratory: Negative.   Cardiovascular: Positive for leg swelling.  Gastrointestinal: Negative.   Endocrine: Negative.   Genitourinary: Negative.   Musculoskeletal: Negative.   Skin: Positive for wound.  Allergic/Immunologic: Negative.   Neurological: Negative.   Hematological: Negative.   Psychiatric/Behavioral: Negative.       Objective:   Physical Exam  Constitutional: She is oriented to person, place, and time. She appears well-developed and well-nourished.  HENT:  Head: Normocephalic and atraumatic.  Right Ear: External ear normal.  Left Ear: External ear normal.    Eyes: Conjunctivae and EOM are normal. Pupils are equal, round, and reactive to light.  Neck: Normal range of motion.  Cardiovascular: Normal rate, regular rhythm, normal heart sounds and intact distal pulses.   Pulses:      Radial pulses are 2+ on the right side, and 2+ on the left side.       Dorsalis pedis pulses are 1+ on the right side, and 1+ on the left side.       Posterior tibial pulses are 1+ on the right side, and 1+ on the left side.  Pulmonary/Chest: Effort normal and breath sounds normal.  Abdominal: Soft. Bowel sounds are normal.  Musculoskeletal: Normal range of motion. She exhibits edema (Moderate Bilaterally).  Neurological: She is alert and oriented to person, place, and time.  Skin:  Four small non-infected ulceration noted on both the medial and lateral aspect of the patients ankles. Scab formation on all four. No leaking. No cellulitis.   Psychiatric: She has a normal mood and affect. Her behavior is normal. Judgment and thought content normal.   BP (!) 156/62   Pulse 61   Resp 16   Ht 5' (1.524 m)   Wt 125 lb 12.8 oz (57.1 kg)   BMI 24.57 kg/m   Past Medical History:  Diagnosis Date  . Asthma   . Cancer ([place])    Right  . Diabetes mellitus without complication ([place])   . DVT (deep venous thrombosis) ([place])   . Hiatal hernia   . Hyperlipemia   . Hypertension   . Hyperthyroidism  Social History   Social History  . Marital status: Married    Spouse name: N/A  . Number of children: N/A  . Years of education: N/A   Occupational History  . Not on file.   Social History Main Topics  . Smoking status: Former Research scientist (life sciences)  . Smokeless tobacco: Never Used  . Alcohol use No  . Drug use: Unknown  . Sexual activity: Not on file   Other Topics Concern  . Not on file   Social History Narrative  . No narrative on file   Past Surgical History:  Procedure Laterality Date  . ABDOMINAL HYSTERECTOMY    . BREAST SURGERY Right   . TOTAL HIP ARTHROPLASTY Left     Family History  Problem Relation Age of Onset  . Hypertension Mother    Allergies  Allergen Reactions  . Shrimp [Shellfish Allergy]   . Sulfa Antibiotics Nausea Only  . Keflex [Cephalexin] Rash      Assessment & Plan:  Patient referred back to our office by Dr. Con Memos from [place]. The patient endorses a history of non-healing ulcerations located on the medical and lateral aspects of her bilateral ankles. States the ulcers have been present for "months". They are slow healing and at times drain a "yellow fluid". She does not wear compression stockings as they are "too uncomfortable". She does not engage in elevation of her lower extremity. She has tried unna wraps in the past however stopped due to "scabs being picked off by staff hurt too much". Today, she states her ulcers are "doing well". They are not draining and seem to be "healing". She was last seen in our office on 04/10/14 and underwent an ABI which showed Right ABI: >1.3 and Left >1.3 - great toe pressure and PPG waveforms within normal limits (on 12/17/12, Right ABI: 0.99 and Left 0.96). On 12/17/12, the patient underwent a right lower extremity venous duplex which was negative for venous reflux. At this point, she was diagnosed with lymphedema and instructed to wear compression and engage in elevation of her lower extremity.   1. Lymphedema - Worsening Patient is not complaint with wearing compression and elevating her legs. We had a lengthy conversation about the importance of these treatments. She agreed to try unna wraps again to control the edema and allow the skin to heal. Will see the patient back in one month and assess for a lymphedema pump at that time. Will also repeat an ABI and venous duplex including the left extremity this time to assess for any changes in arterial of venous status.   2. Ulcer of ankle, left, limited to breakdown of skin ([place]) - New Recommend bilateral unna wraps x one month. Recommend  elevation as much as possible.  - VAS [place] ABI WITH/WO TBI; Future - VAS [place] LOWER EXTREMITY VENOUS REFLUX; Future  3. Ulcer of ankle, right, limited to breakdown of skin ([place]) - New Recommend bilateral unna wraps x one month. Recommend elevation as much as possible.  - VAS [place] ABI WITH/WO TBI; Future - VAS [place] LOWER EXTREMITY VENOUS REFLUX; Future  Current Outpatient Prescriptions on File Prior to Visit  Medication Sig Dispense Refill  . acetaminophen (TYLENOL) 500 MG tablet Take 500 mg by mouth every 4 (four) hours as needed for mild pain.     Marland Kitchen albuterol (PROVENTIL HFA;VENTOLIN HFA) 108 (90 Base) MCG/ACT inhaler Inhale 2 puffs into the lungs every 4 (four) hours as needed for wheezing.    Marland Kitchen alendronate (FOSAMAX)  70 MG tablet Take 70 mg by mouth every Wednesday.    Marland Kitchen amLODipine (NORVASC) 5 MG tablet Take 5 mg by mouth daily.    Marland Kitchen aspirin EC 81 MG tablet Take 81 mg by mouth daily.    . bisacodyl (DULCOLAX) 10 MG suppository Place 10 mg rectally daily as needed for moderate constipation.     . Calcium Carbonate-Vitamin D (CALCIUM 600+D) 600-200 MG-UNIT TABS Take 1 tablet by mouth 2 (two) times daily.    . cetirizine (ZYRTEC) 10 MG tablet Take 10 mg by mouth daily.    . cloNIDine (CATAPRES) 0.1 MG tablet Take 0.1 mg by mouth 2 (two) times daily.    Marland Kitchen exemestane (AROMASIN) 25 MG tablet Take 25 mg by mouth daily.    . ferrous Q000111Q C-folic acid (TRINSICON / FOLTRIN) capsule Take 1 capsule by mouth daily before breakfast.    . furosemide (LASIX) 40 MG tablet Take 40 mg by mouth daily.    Marland Kitchen gabapentin (NEURONTIN) 300 MG capsule Take 300 mg by mouth 3 (three) times daily.    . metaxalone (SKELAXIN) 800 MG tablet Take 800 mg by mouth 3 (three) times daily as needed for muscle spasms.     . Multiple Vitamins-Minerals (CENTRUM SILVER PO) Take 1 tablet by mouth daily.    Marland Kitchen oxyCODONE (OXY IR/ROXICODONE) 5 MG immediate release tablet Take 1 tablet (5 mg total) by mouth every 4 (four) hours  as needed for moderate pain. 30 tablet 0  . potassium chloride SA (K-DUR,KLOR-CON) 20 MEQ tablet Take 40 mEq by mouth 3 (three) times daily.    . simvastatin (ZOCOR) 10 MG tablet Take 10 mg by mouth at bedtime.    Marland Kitchen tiotropium (SPIRIVA) 18 MCG inhalation capsule Place 18 mcg into inhaler and inhale daily.     No current facility-administered medications on file prior to visit.     There are no Patient Instructions on file for this visit. No Follow-up on file.   KIMBERLY A STEGMAYER, PA-C

## 2016-04-27 ENCOUNTER — Encounter (INDEPENDENT_AMBULATORY_CARE_PROVIDER_SITE_OTHER): Payer: Medicare Other

## 2016-04-27 ENCOUNTER — Encounter (INDEPENDENT_AMBULATORY_CARE_PROVIDER_SITE_OTHER): Payer: Self-pay | Admitting: Vascular Surgery

## 2016-04-27 ENCOUNTER — Ambulatory Visit (INDEPENDENT_AMBULATORY_CARE_PROVIDER_SITE_OTHER): Payer: Medicare Other | Admitting: Vascular Surgery

## 2016-04-27 VITALS — BP 190/81 | HR 74 | Resp 17 | Ht 61.0 in | Wt 125.0 lb

## 2016-04-27 DIAGNOSIS — L97311 Non-pressure chronic ulcer of right ankle limited to breakdown of skin: Secondary | ICD-10-CM

## 2016-04-27 DIAGNOSIS — L97321 Non-pressure chronic ulcer of left ankle limited to breakdown of skin: Secondary | ICD-10-CM

## 2016-04-27 NOTE — Progress Notes (Signed)
History of Present Illness  There is no documented history at this time  Assessments & Plan   There are no diagnoses linked to this encounter.    Additional instructions  Subjective:  Patient presents with venous ulcer of the Bilateral lower extremity.    Procedure:  3 layer unna wrap was placed Bilateral lower extremity.   Plan:   Follow up in one week.  

## 2016-05-04 ENCOUNTER — Encounter (INDEPENDENT_AMBULATORY_CARE_PROVIDER_SITE_OTHER): Payer: Medicare Other

## 2016-05-11 ENCOUNTER — Encounter (INDEPENDENT_AMBULATORY_CARE_PROVIDER_SITE_OTHER): Payer: Medicare Other

## 2016-05-19 ENCOUNTER — Ambulatory Visit (INDEPENDENT_AMBULATORY_CARE_PROVIDER_SITE_OTHER): Payer: Medicare Other | Admitting: Vascular Surgery

## 2016-07-07 ENCOUNTER — Ambulatory Visit (INDEPENDENT_AMBULATORY_CARE_PROVIDER_SITE_OTHER): Payer: Medicare Other | Admitting: Vascular Surgery

## 2016-07-07 ENCOUNTER — Encounter (INDEPENDENT_AMBULATORY_CARE_PROVIDER_SITE_OTHER): Payer: Medicare Other

## 2016-07-08 ENCOUNTER — Emergency Department: Payer: Medicare Other

## 2016-07-08 ENCOUNTER — Encounter: Payer: Self-pay | Admitting: Emergency Medicine

## 2016-07-08 ENCOUNTER — Ambulatory Visit: Payer: Medicare Other | Admitting: Surgery

## 2016-07-08 ENCOUNTER — Emergency Department
Admission: EM | Admit: 2016-07-08 | Discharge: 2016-07-08 | Disposition: A | Discharge status: Home / Self Care | Payer: Medicare Other | Attending: Emergency Medicine | Admitting: Emergency Medicine

## 2016-07-08 DIAGNOSIS — L97921 Non-pressure chronic ulcer of unspecified part of left lower leg limited to breakdown of skin: Secondary | ICD-10-CM | POA: Insufficient documentation

## 2016-07-08 DIAGNOSIS — Z87891 Personal history of nicotine dependence: Secondary | ICD-10-CM | POA: Diagnosis not present

## 2016-07-08 DIAGNOSIS — Z79899 Other long term (current) drug therapy: Secondary | ICD-10-CM | POA: Diagnosis not present

## 2016-07-08 DIAGNOSIS — J45909 Unspecified asthma, uncomplicated: Secondary | ICD-10-CM | POA: Insufficient documentation

## 2016-07-08 DIAGNOSIS — Z7982 Long term (current) use of aspirin: Secondary | ICD-10-CM | POA: Diagnosis not present

## 2016-07-08 DIAGNOSIS — I10 Essential (primary) hypertension: Secondary | ICD-10-CM | POA: Insufficient documentation

## 2016-07-08 DIAGNOSIS — E11622 Type 2 diabetes mellitus with other skin ulcer: Secondary | ICD-10-CM | POA: Diagnosis present

## 2016-07-08 LAB — CBC WITH DIFFERENTIAL/PLATELET
BASOS PCT: 0 %
Basophils Absolute: 0 10*3/uL (ref 0–0.1)
Eosinophils Absolute: 0.1 10*3/uL (ref 0–0.7)
Eosinophils Relative: 1 %
HEMATOCRIT: 37.4 % (ref 35.0–47.0)
Hemoglobin: 13 g/dL (ref 12.0–16.0)
Lymphocytes Relative: 14 %
Lymphs Abs: 1.5 10*3/uL (ref 1.0–3.6)
MCH: 28.2 pg (ref 26.0–34.0)
MCHC: 34.7 g/dL (ref 32.0–36.0)
MCV: 81.2 fL (ref 80.0–100.0)
MONO ABS: 0.7 10*3/uL (ref 0.2–0.9)
MONOS PCT: 6 %
NEUTROS ABS: 8.4 10*3/uL — AB (ref 1.4–6.5)
Neutrophils Relative %: 79 %
Platelets: 229 10*3/uL (ref 150–440)
RBC: 4.6 MIL/uL (ref 3.80–5.20)
RDW: 14 % (ref 11.5–14.5)
WBC: 10.6 10*3/uL (ref 3.6–11.0)

## 2016-07-08 LAB — COMPREHENSIVE METABOLIC PANEL
ALBUMIN: 3.9 g/dL (ref 3.5–5.0)
ALT: 14 U/L (ref 14–54)
ANION GAP: 8 (ref 5–15)
AST: 27 U/L (ref 15–41)
Alkaline Phosphatase: 64 U/L (ref 38–126)
BILIRUBIN TOTAL: 0.7 mg/dL (ref 0.3–1.2)
BUN: 11 mg/dL (ref 6–20)
CO2: 31 mmol/L (ref 22–32)
Calcium: 9.1 mg/dL (ref 8.9–10.3)
Chloride: 101 mmol/L (ref 101–111)
Creatinine, Ser: 0.5 mg/dL (ref 0.44–1.00)
Glucose, Bld: 113 mg/dL — ABNORMAL HIGH (ref 65–99)
POTASSIUM: 3.9 mmol/L (ref 3.5–5.1)
Sodium: 140 mmol/L (ref 135–145)
TOTAL PROTEIN: 7.8 g/dL (ref 6.5–8.1)

## 2016-07-08 LAB — LACTIC ACID, PLASMA: Lactic Acid, Venous: 1.1 mmol/L (ref 0.5–1.9)

## 2016-07-08 MED ORDER — PIPERACILLIN-TAZOBACTAM 3.375 G IVPB 30 MIN
3.3750 g | Freq: Once | INTRAVENOUS | Status: AC
  Administered 2016-07-08: 3.375 g via INTRAVENOUS
  Filled 2016-07-08: qty 50

## 2016-07-08 MED ORDER — AMOXICILLIN-POT CLAVULANATE 875-125 MG PO TABS: 1.0000 | ORAL_TABLET | Freq: Two times a day (BID) | ORAL | 0 refills | Status: DC

## 2016-07-08 MED ORDER — HYDROCODONE-ACETAMINOPHEN 5-325 MG PO TABS: 1.0000 | ORAL_TABLET | Freq: Four times a day (QID) | ORAL | 0 refills | Status: DC | PRN

## 2016-07-08 MED ORDER — MORPHINE SULFATE (PF) 4 MG/ML IV SOLN
4.0000 mg | Freq: Once | INTRAVENOUS | Status: AC
  Administered 2016-07-08: 4 mg via INTRAVENOUS
  Filled 2016-07-08: qty 1

## 2016-07-08 NOTE — Discharge Instructions (Signed)
Take augmentin twice daily for 10 days.   Take tylenol for pain.   Take vicodin for severe pain.   See your wound care nurse and your doctor  Return to ER if you have fever, worse purulent drainage, severe pain.

## 2016-07-08 NOTE — Care Management Note (Signed)
Case Management Note  Patient Details  Name: Lindsey English MRN: [id number] Date of Birth: [birth year]  Subjective/Objective:             Spoke to patient and spouse in room 33. They are fine with [facility] agency assisting with dressing changes. Patient has agreed to nurse and husband doing changes as required. No further questions when asked.      Action/Plan:   Expected Discharge Date:                  Expected Discharge Plan:     In-House Referral:     Discharge planning Services     Post Acute Care Choice:    Choice offered to:     DME Arranged:    DME Agency:     HH Arranged:    HH Agency:     Status of Service:     If discussed at [employer] of Stay Meetings, dates discussed:    Additional Comments:  Beau Fanny, RN 07/08/2016, 11:31 AM

## 2016-07-08 NOTE — ED Provider Notes (Signed)
[facility] Provider Note   CSN: [id number] Arrival date & time: 07/08/16  [id number]     History   Chief Complaint Chief Complaint  Patient presents with  . Recurrent Skin Infections    HPI Lindsey English is a 83 y.o. female hx of DVT of abx, DM, Nonhealing left leg ulcer here presenting with worsening pain in the leg ulcer. Patient states that she has been having this leg ulcer for several months. Was put on clindamycin several weeks ago but was allergic to that. About 2 weeks ago, she was prescribed doxycycline and finished a course of doxycycline but did not help with the wound. She has been referred to vascular surgery as well as wound clinic but has been refusing dressing changes or further follow-up with the wound clinic. She is sent here for evaluation.    The history is provided by the patient.    Past Medical History:  Diagnosis Date  . Asthma   . Cancer ([place])    Right  . Diabetes mellitus without complication ([place])   . DVT (deep venous thrombosis) ([place])   . Hiatal hernia   . Hyperlipemia   . Hypertension   . Hyperthyroidism     Patient Active Problem List   Diagnosis Date Noted  . Lymphedema 04/20/2016  . Ulcer of ankle, left, limited to breakdown of skin ([place]) 04/20/2016  . Ulcer of ankle, right, limited to breakdown of skin ([place]) 04/20/2016  . Cellulitis 02/29/2016    Past Surgical History:  Procedure Laterality Date  . ABDOMINAL HYSTERECTOMY    . BREAST SURGERY Right   . TOTAL HIP ARTHROPLASTY Left     OB History    No data available       Home Medications    Prior to Admission medications   Medication Sig Start Date End Date Taking? Authorizing Provider  acetaminophen (TYLENOL) 500 MG tablet Take 500 mg by mouth every 4 (four) hours as needed for mild pain.    Yes Historical Provider, MD  albuterol (PROVENTIL HFA;VENTOLIN HFA) 108 (90 Base) MCG/ACT inhaler Inhale 2 puffs into the lungs every 4 (four) hours as needed for wheezing.    Yes Historical Provider, MD  amLODipine (NORVASC) 5 MG tablet Take 5 mg by mouth daily.   Yes Historical Provider, MD  aspirin EC 81 MG tablet Take 81 mg by mouth daily.   Yes Historical Provider, MD  bisacodyl (DULCOLAX) 10 MG suppository Place 10 mg rectally daily as needed for moderate constipation.    Yes Historical Provider, MD  Calcium Carbonate-Vitamin D (CALCIUM 600+D) 600-200 MG-UNIT TABS Take 1 tablet by mouth daily.    Yes Historical Provider, MD  carvedilol (COREG) 12.5 MG tablet Take 12.5 mg by mouth 2 (two) times daily with a meal.   Yes Historical Provider, MD  cetirizine (ZYRTEC) 10 MG tablet Take 10 mg by mouth daily.   Yes Historical Provider, MD  cloNIDine (CATAPRES) 0.1 MG tablet Take 0.1 mg by mouth 2 (two) times daily.   Yes Historical Provider, MD  ferrous Q000111Q C-folic acid (TRINSICON / FOLTRIN) capsule Take 1 capsule by mouth daily before breakfast.   Yes Historical Provider, MD  furosemide (LASIX) 40 MG tablet Take 40 mg by mouth daily.   Yes Historical Provider, MD  gabapentin (NEURONTIN) 300 MG capsule Take 300 mg by mouth 2 (two) times daily.    Yes Historical Provider, MD  Multiple Vitamins-Minerals (CENTRUM SILVER PO) Take 1 tablet by mouth daily.   Yes Historical  Provider, MD  oxyCODONE (OXY IR/ROXICODONE) 5 MG immediate release tablet Take 1 tablet (5 mg total) by mouth every 4 (four) hours as needed for moderate pain. 03/02/16  Yes Lytle [place], MD  potassium chloride SA (K-DUR,KLOR-CON) 20 MEQ tablet Take 40 mEq by mouth 3 (three) times daily.   Yes Historical Provider, MD  SANTYL ointment Apply 1 application topically 4 (four) times daily. 06/28/16  Yes Historical Provider, MD  simvastatin (ZOCOR) 10 MG tablet Take 10 mg by mouth at bedtime.   Yes Historical Provider, MD  SSD 1 % cream Apply 1 application topically 2 (two) times daily. 06/17/16  Yes Historical Provider, MD  tiotropium (SPIRIVA HANDIHALER) 18 MCG inhalation capsule PLACE CAPSULE IN  CHAMBER, CLOSE, EXHALE, PUT CHAMBER TO MOUTH AND INHALE ONCE A DAY. 12/11/15  Yes Historical Provider, MD  alendronate (FOSAMAX) 70 MG tablet Take 70 mg by mouth every Wednesday.    Historical Provider, MD    Family History Family History  Problem Relation Age of Onset  . Hypertension Mother     Social History Social History  Substance Use Topics  . Smoking status: Former Research scientist (life sciences)  . Smokeless tobacco: Never Used  . Alcohol use No     Allergies   Shrimp [shellfish allergy]; Sulfa antibiotics; Clindamycin hcl; and Keflex [cephalexin]   Review of Systems Review of Systems  Musculoskeletal:       L leg pain   All other systems reviewed and are negative.    Physical Exam Updated Vital Signs BP (!) 186/86 (BP Location: Left Arm)   Pulse 98   Temp 98.9 F (37.2 C) (Oral)   Resp 20   Wt 125 lb (56.7 kg)   SpO2 94%   BMI 23.62 kg/m   Physical Exam  Constitutional: She is oriented to person, place, and time.  Chronically ill, uncomfortable   HENT:  Head: Normocephalic.  Mouth/Throat: Oropharynx is clear and moist.  Eyes: EOM are normal. Pupils are equal, round, and reactive to light.  Neck: Normal range of motion. Neck supple.  Cardiovascular: Normal rate, regular rhythm and normal heart sounds.   Pulmonary/Chest: Effort normal and breath sounds normal. No respiratory distress. She has no wheezes. She has no rales.  Abdominal: Soft. Bowel sounds are normal. She exhibits no distension. There is no tenderness. There is no guarding.  Musculoskeletal: Normal range of motion.  8 cm ulcer lateral aspect L lower leg, some purulent discharge. There is surrounding cellulitis. Mild calf tenderness as well. 2+ pulses   Neurological: She is alert and oriented to person, place, and time.  Skin: Skin is warm. There is erythema.  Psychiatric: She has a normal mood and affect.  Nursing note and vitals reviewed.    ED Treatments / Results  Labs (all labs ordered are listed, but  only abnormal results are displayed) Labs Reviewed  CBC WITH DIFFERENTIAL/PLATELET - Abnormal; Notable for the following:       Result Value   Neutro Abs 8.4 (*)    All other components within normal limits  COMPREHENSIVE METABOLIC PANEL - Abnormal; Notable for the following:    Glucose, Bld 113 (*)    All other components within normal limits  CULTURE, BLOOD (ROUTINE X 2)  CULTURE, BLOOD (ROUTINE X 2)  LACTIC ACID, PLASMA    EKG  EKG Interpretation None       Radiology Dg Tibia/fibula Left  Result Date: 07/08/2016 CLINICAL DATA:  Open wound left lower lateral tibia. EXAM: LEFT TIBIA AND FIBULA -  2 VIEW COMPARISON:  January 2016. FINDINGS: There is no evidence of fracture or other focal bone lesions. Lateral soft tissue irregularity in the distal lower leg. IMPRESSION: Negative. No sign of osteomyelitis. Lateral soft tissue irregularity. Electronically Signed   By: Nelson Chimes M.D.   On: 07/08/2016 09:32   US Venous Img Lower Unilateral Left  Result Date: 07/08/2016 EXAM: LEFT LOWER EXTREMITY VENOUS DOPPLER ULTRASOUND TECHNIQUE: Gray-scale sonography with graded compression, as well as color Doppler and duplex ultrasound were performed to evaluate the lower extremity deep venous systems from the level of the common femoral vein and including the common femoral, femoral, profunda femoral, popliteal and calf veins including the posterior tibial, peroneal and gastrocnemius veins when visible. The superficial great saphenous vein was also interrogated. Spectral Doppler was utilized to evaluate flow at rest and with distal augmentation maneuvers in the common femoral, femoral and popliteal veins. COMPARISON:  None. FINDINGS: Contralateral Common Femoral Vein: Respiratory phasicity is normal and symmetric with the symptomatic side. No evidence of thrombus. Normal compressibility. Common Femoral Vein: No evidence of thrombus. Normal compressibility, respiratory phasicity and response to  augmentation. Saphenofemoral Junction: No evidence of thrombus. Normal compressibility and flow on color Doppler imaging. Profunda Femoral Vein: No evidence of thrombus. Normal compressibility and flow on color Doppler imaging. Femoral Vein: No evidence of thrombus. Normal compressibility, respiratory phasicity and response to augmentation. Popliteal Vein: No evidence of thrombus. Normal compressibility, respiratory phasicity and response to augmentation. Calf Veins: Not well-visualized secondary to soft tissue edema and patient is unable to tolerate compression secondary to severe pain. Superficial Great Saphenous Vein: No evidence of thrombus. Normal compressibility and flow on color Doppler imaging. Venous Reflux:  None. Other Findings:  None. IMPRESSION: No deep venous thrombosis of the left lower extremity. Calf veins are not visualizing cannot be assessed secondary to soft tissue edema and patient's inability to tolerate compression. Electronically Signed   By: Kathreen Devoid   On: 07/08/2016 10:26    Procedures Procedures (including critical care time)  Medications Ordered in ED Medications  morphine 4 MG/ML injection 4 mg (4 mg Intravenous Given 07/08/16 0957)  piperacillin-tazobactam (ZOSYN) IVPB 3.375 g (3.375 g Intravenous New Bag/Given 07/08/16 0957)     Initial Impression / Assessment and Plan / ED Course  I have reviewed the triage vital signs and the nursing notes.  Pertinent labs & imaging results that were available during my care of the patient were reviewed by me and considered in my medical decision making (see chart for details).     Lindsey English is a 83 y.o. female here with L leg ulcer, cellulitis. Consider DVT vs recurrent cellulitis vs osteo. Will get labs, DVT study, xrays. She has multiple drug allergies, will try zosyn for now.   11:37 AM WBC nl. Lactate nl. xrays showed no osteo. DVT study neg. Given zosyn. Has clinda, bactrim, keflex allergy. Will dc home with  augmentin. Has home health to come to change dressing.    Final Clinical Impressions(s) / ED Diagnoses   Final diagnoses:  None    New Prescriptions New Prescriptions   No medications on file     Drenda Freeze, MD 07/08/16 1141

## 2016-07-08 NOTE — ED Triage Notes (Signed)
Pt with left lower leg infection for three to four mths. Pt currently being seen at the wound center.

## 2016-07-09 ENCOUNTER — Emergency Department
Admission: EM | Admit: 2016-07-09 | Discharge: 2016-07-09 | Disposition: A | Discharge status: Home / Self Care | Payer: Medicare Other | Attending: Emergency Medicine | Admitting: Emergency Medicine

## 2016-07-09 ENCOUNTER — Encounter: Payer: Self-pay | Admitting: Emergency Medicine

## 2016-07-09 DIAGNOSIS — R21 Rash and other nonspecific skin eruption: Secondary | ICD-10-CM | POA: Insufficient documentation

## 2016-07-09 DIAGNOSIS — I10 Essential (primary) hypertension: Secondary | ICD-10-CM | POA: Diagnosis not present

## 2016-07-09 DIAGNOSIS — Z5321 Procedure and treatment not carried out due to patient leaving prior to being seen by health care provider: Secondary | ICD-10-CM | POA: Insufficient documentation

## 2016-07-09 DIAGNOSIS — E119 Type 2 diabetes mellitus without complications: Secondary | ICD-10-CM | POA: Insufficient documentation

## 2016-07-09 DIAGNOSIS — Z7982 Long term (current) use of aspirin: Secondary | ICD-10-CM | POA: Insufficient documentation

## 2016-07-09 DIAGNOSIS — Z79899 Other long term (current) drug therapy: Secondary | ICD-10-CM | POA: Insufficient documentation

## 2016-07-09 DIAGNOSIS — Z87891 Personal history of nicotine dependence: Secondary | ICD-10-CM | POA: Insufficient documentation

## 2016-07-09 DIAGNOSIS — J45909 Unspecified asthma, uncomplicated: Secondary | ICD-10-CM | POA: Insufficient documentation

## 2016-07-09 LAB — COMPREHENSIVE METABOLIC PANEL
ALBUMIN: 3.6 g/dL (ref 3.5–5.0)
ALK PHOS: 52 U/L (ref 38–126)
ALT: 13 U/L — AB (ref 14–54)
AST: 20 U/L (ref 15–41)
Anion gap: 8 (ref 5–15)
BILIRUBIN TOTAL: 0.6 mg/dL (ref 0.3–1.2)
BUN: 13 mg/dL (ref 6–20)
CALCIUM: 8.8 mg/dL — AB (ref 8.9–10.3)
CO2: 29 mmol/L (ref 22–32)
CREATININE: 0.5 mg/dL (ref 0.44–1.00)
Chloride: 104 mmol/L (ref 101–111)
GFR calc Af Amer: >60 mL/min (ref >60)
GFR calc non Af Amer: >60 mL/min (ref >60)
GLUCOSE: 110 mg/dL — AB (ref 65–99)
Potassium: 3.7 mmol/L (ref 3.5–5.1)
Sodium: 141 mmol/L (ref 135–145)
TOTAL PROTEIN: 7 g/dL (ref 6.5–8.1)

## 2016-07-09 LAB — CBC
HEMATOCRIT: 38.2 % (ref 35.0–47.0)
Hemoglobin: 12.6 g/dL (ref 12.0–16.0)
MCH: 27 pg (ref 26.0–34.0)
MCHC: 33 g/dL (ref 32.0–36.0)
MCV: 81.7 fL (ref 80.0–100.0)
Platelets: 250 10*3/uL (ref 150–440)
RBC: 4.67 MIL/uL (ref 3.80–5.20)
RDW: 14.2 % (ref 11.5–14.5)
WBC: 12.7 10*3/uL — ABNORMAL HIGH (ref 3.6–11.0)

## 2016-07-09 LAB — LIPASE, BLOOD: Lipase: 26 U/L (ref 11–51)

## 2016-07-09 MED ORDER — ONDANSETRON 4 MG PO TBDP
4.0000 mg | ORAL_TABLET | Freq: Once | ORAL | Status: AC | PRN
  Administered 2016-07-09: 4 mg via ORAL

## 2016-07-09 MED ORDER — ONDANSETRON 4 MG PO TBDP
ORAL_TABLET | ORAL | Status: AC
  Administered 2016-07-09: 4 mg via ORAL
  Filled 2016-07-09: qty 1

## 2016-07-09 NOTE — ED Triage Notes (Addendum)
Pt has wound to left lower leg that has been present for several months. Pt seen here yesterday and started on a new atbx. Pt states she has been vomiting all through the night. Husband states she did have food on her stomach when she took the antibiotic and the pain pill.  Minimal redness noted to upper thighs and bilateral lower arms. Pt taking Augmentin that was started yesterday.

## 2016-07-09 NOTE — ED Triage Notes (Signed)
Called for room, not in waiting room.

## 2016-07-13 LAB — CULTURE, BLOOD (ROUTINE X 2)
CULTURE: NO GROWTH
Culture: NO GROWTH

## 2016-11-09 DIAGNOSIS — F0390 Unspecified dementia without behavioral disturbance: Secondary | ICD-10-CM | POA: Diagnosis present

## 2017-09-18 ENCOUNTER — Other Ambulatory Visit: Payer: Self-pay

## 2017-09-18 ENCOUNTER — Encounter: Payer: Self-pay | Admitting: Emergency Medicine

## 2017-09-18 ENCOUNTER — Emergency Department
Admission: EM | Admit: 2017-09-18 | Discharge: 2017-09-18 | Disposition: A | Discharge status: Home / Self Care | Payer: Medicare Other | Attending: Emergency Medicine | Admitting: Emergency Medicine

## 2017-09-18 ENCOUNTER — Emergency Department: Payer: Medicare Other

## 2017-09-18 ENCOUNTER — Ambulatory Visit
Admission: EM | Admit: 2017-09-18 | Discharge: 2017-09-18 | Disposition: A | Discharge status: Other Acute Inpatient Hospital | Payer: Medicare Other | Source: Home / Self Care | Attending: Family Medicine | Admitting: Family Medicine

## 2017-09-18 DIAGNOSIS — Z87891 Personal history of nicotine dependence: Secondary | ICD-10-CM | POA: Insufficient documentation

## 2017-09-18 DIAGNOSIS — E119 Type 2 diabetes mellitus without complications: Secondary | ICD-10-CM | POA: Insufficient documentation

## 2017-09-18 DIAGNOSIS — Z96642 Presence of left artificial hip joint: Secondary | ICD-10-CM | POA: Insufficient documentation

## 2017-09-18 DIAGNOSIS — M7989 Other specified soft tissue disorders: Secondary | ICD-10-CM

## 2017-09-18 DIAGNOSIS — L0390 Cellulitis, unspecified: Secondary | ICD-10-CM

## 2017-09-18 DIAGNOSIS — Z859 Personal history of malignant neoplasm, unspecified: Secondary | ICD-10-CM | POA: Insufficient documentation

## 2017-09-18 DIAGNOSIS — L97929 Non-pressure chronic ulcer of unspecified part of left lower leg with unspecified severity: Secondary | ICD-10-CM

## 2017-09-18 DIAGNOSIS — Z7982 Long term (current) use of aspirin: Secondary | ICD-10-CM | POA: Insufficient documentation

## 2017-09-18 DIAGNOSIS — L03115 Cellulitis of right lower limb: Secondary | ICD-10-CM | POA: Diagnosis not present

## 2017-09-18 DIAGNOSIS — E0590 Thyrotoxicosis, unspecified without thyrotoxic crisis or storm: Secondary | ICD-10-CM | POA: Insufficient documentation

## 2017-09-18 DIAGNOSIS — M79661 Pain in right lower leg: Secondary | ICD-10-CM | POA: Diagnosis not present

## 2017-09-18 DIAGNOSIS — J45909 Unspecified asthma, uncomplicated: Secondary | ICD-10-CM | POA: Insufficient documentation

## 2017-09-18 DIAGNOSIS — Z79899 Other long term (current) drug therapy: Secondary | ICD-10-CM | POA: Insufficient documentation

## 2017-09-18 DIAGNOSIS — I10 Essential (primary) hypertension: Secondary | ICD-10-CM | POA: Diagnosis not present

## 2017-09-18 DIAGNOSIS — R2241 Localized swelling, mass and lump, right lower limb: Secondary | ICD-10-CM | POA: Diagnosis present

## 2017-09-18 LAB — CBC WITH DIFFERENTIAL/PLATELET
Basophils Absolute: 0 10*3/uL (ref 0–0.1)
Basophils Relative: 0 %
EOS PCT: 3 %
Eosinophils Absolute: 0.2 10*3/uL (ref 0–0.7)
HCT: 40.4 % (ref 35.0–47.0)
Hemoglobin: 13.2 g/dL (ref 12.0–16.0)
LYMPHS ABS: 1.3 10*3/uL (ref 1.0–3.6)
LYMPHS PCT: 22 %
MCH: 27 pg (ref 26.0–34.0)
MCHC: 32.6 g/dL (ref 32.0–36.0)
MCV: 82.6 fL (ref 80.0–100.0)
MONO ABS: 0.3 10*3/uL (ref 0.2–0.9)
Monocytes Relative: 6 %
Neutro Abs: 3.9 10*3/uL (ref 1.4–6.5)
Neutrophils Relative %: 69 %
PLATELETS: 168 10*3/uL (ref 150–440)
RBC: 4.89 MIL/uL (ref 3.80–5.20)
RDW: 15.3 % — AB (ref 11.5–14.5)
WBC: 5.8 10*3/uL (ref 3.6–11.0)

## 2017-09-18 LAB — COMPREHENSIVE METABOLIC PANEL
ALT: 13 U/L — ABNORMAL LOW (ref 14–54)
ANION GAP: 6 (ref 5–15)
AST: 19 U/L (ref 15–41)
Albumin: 4.3 g/dL (ref 3.5–5.0)
Alkaline Phosphatase: 59 U/L (ref 38–126)
BUN: 9 mg/dL (ref 6–20)
CHLORIDE: 102 mmol/L (ref 101–111)
CO2: 33 mmol/L — AB (ref 22–32)
CREATININE: 0.49 mg/dL (ref 0.44–1.00)
Calcium: 8.9 mg/dL (ref 8.9–10.3)
Glucose, Bld: 119 mg/dL — ABNORMAL HIGH (ref 65–99)
POTASSIUM: 3.6 mmol/L (ref 3.5–5.1)
Sodium: 141 mmol/L (ref 135–145)
Total Bilirubin: 0.6 mg/dL (ref 0.3–1.2)
Total Protein: 7.9 g/dL (ref 6.5–8.1)

## 2017-09-18 MED ORDER — DOXYCYCLINE HYCLATE 100 MG PO TABS: 100.0000 mg | ORAL_TABLET | Freq: Two times a day (BID) | ORAL | 0 refills | Status: AC

## 2017-09-18 NOTE — ED Provider Notes (Signed)
[facility] Emergency Department Provider Note  ____________________________________________   I have reviewed the triage vital signs and the nursing notes.   HISTORY  Chief Complaint Right leg swelling and pain  History limited by: Not Limited   HPI Lindsey English is a 84 y.o. female who presents to the emergency department today because of concerns for right leg swelling and pain.  The symptoms have been present for the past 2 weeks.  They have noticed some associated redness overlying that skin.  Only sought care today because a friend told him they should.  Denies any new symptoms today.  Patient does have a history of blood clots.  Was sent from urgent care because of concern for possible blood clot.  Patient denies any nausea vomiting weakness or fevers. Patient has been seen in wound care center for ulcer to left leg.    Per medical record review patient has a history of DVT  Past Medical History:  Diagnosis Date  . Asthma   . Cancer ([place])    Right  . Diabetes mellitus without complication ([place])   . DVT (deep venous thrombosis) ([place])   . Hiatal hernia   . Hyperlipemia   . Hypertension   . Hyperthyroidism     Patient Active Problem List   Diagnosis Date Noted  . Lymphedema 04/20/2016  . Ulcer of ankle, left, limited to breakdown of skin ([place]) 04/20/2016  . Ulcer of ankle, right, limited to breakdown of skin ([place]) 04/20/2016  . Cellulitis 02/29/2016    Past Surgical History:  Procedure Laterality Date  . ABDOMINAL HYSTERECTOMY    . BREAST SURGERY Right   . TOTAL HIP ARTHROPLASTY Left     Prior to Admission medications   Medication Sig Start Date End Date Taking? Authorizing Provider  acetaminophen (TYLENOL) 500 MG tablet Take 500 mg by mouth every 4 (four) hours as needed for mild pain.     [provider]  albuterol (PROVENTIL HFA;VENTOLIN HFA) 108 (90 Base) MCG/ACT inhaler Inhale 2 puffs into the lungs every 4 (four) hours as  needed for wheezing.    [provider]  alendronate (FOSAMAX) 70 MG tablet Take 70 mg by mouth every Wednesday.    [provider]  amLODipine (NORVASC) 5 MG tablet Take 5 mg by mouth daily.    [provider]  amoxicillin-clavulanate (AUGMENTIN) 875-125 MG tablet Take 1 tablet by mouth 2 (two) times daily. One po bid x 10 days 07/08/16   Drenda Freeze, MD  aspirin EC 81 MG tablet Take 81 mg by mouth daily.    [provider]  bisacodyl (DULCOLAX) 10 MG suppository Place 10 mg rectally daily as needed for moderate constipation.     [provider]  Calcium Carbonate-Vitamin D (CALCIUM 600+D) 600-200 MG-UNIT TABS Take 1 tablet by mouth daily.     [provider]  carvedilol (COREG) 12.5 MG tablet Take 12.5 mg by mouth 2 (two) times daily with a meal.    [provider]  cetirizine (ZYRTEC) 10 MG tablet Take 10 mg by mouth daily.    [provider]  cloNIDine (CATAPRES) 0.1 MG tablet Take 0.1 mg by mouth 2 (two) times daily.    [provider]  ferrous [id number] C-folic acid (TRINSICON / FOLTRIN) capsule Take 1 capsule by mouth daily before breakfast.    [provider]  furosemide (LASIX) 40 MG tablet Take 40 mg by mouth daily.    [provider]  gabapentin (  NEURONTIN) 300 MG capsule Take 300 mg by mouth 2 (two) times daily.     [provider]  HYDROcodone-acetaminophen (NORCO/VICODIN) 5-325 MG tablet Take 1 tablet by mouth every 6 (six) hours as needed for moderate pain. 07/08/16   Drenda Freeze, MD  Multiple Vitamins-Minerals (CENTRUM SILVER PO) Take 1 tablet by mouth daily.    [provider]  oxyCODONE (OXY IR/ROXICODONE) 5 MG immediate release tablet Take 1 tablet (5 mg total) by mouth every 4 (four) hours as needed for moderate pain. 03/02/16   Hower, Aaron Mose, MD  potassium chloride SA (K-DUR,KLOR-CON) 20 MEQ tablet Take 40 mEq by mouth 3 (three) times daily.     [provider]  SANTYL ointment Apply 1 application topically 4 (four) times daily. 06/28/16   [provider]  simvastatin (ZOCOR) 10 MG tablet Take 10 mg by mouth at bedtime.    [provider]  SSD 1 % cream Apply 1 application topically 2 (two) times daily. 06/17/16   [provider]  tiotropium (SPIRIVA HANDIHALER) 18 MCG inhalation capsule PLACE CAPSULE IN CHAMBER, CLOSE, EXHALE, PUT CHAMBER TO MOUTH AND INHALE ONCE A DAY. 12/11/15   [provider]    Allergies Shrimp [shellfish allergy]; Sulfa antibiotics; Clindamycin hcl; and Keflex [cephalexin]  Family History  Problem Relation Age of Onset  . Hypertension Mother     Social History Social History   Tobacco Use  . Smoking status: Former Research scientist (life sciences)  . Smokeless tobacco: Never Used  Substance Use Topics  . Alcohol use: No  . Drug use: Not on file    Review of Systems Constitutional: No fever/chills Eyes: No visual changes. ENT: No sore throat. Cardiovascular: Denies chest pain. Respiratory: Denies shortness of breath. Gastrointestinal: No abdominal pain.  No nausea, no vomiting.  No diarrhea.   Genitourinary: Negative for dysuria. Musculoskeletal: Positive for right leg swelling and pain Skin: Positive for redness to right leg. Neurological: Negative for headaches, focal weakness or numbness.  ____________________________________________   PHYSICAL EXAM:  VITAL SIGNS: ED Triage Vitals  Enc Vitals Group     BP 09/18/17 1202 (!) 213/63     Pulse Rate 09/18/17 1202 72     Resp 09/18/17 1202 20     Temp 09/18/17 1202 98.1 F (36.7 C)     Temp Source 09/18/17 1202 Oral     SpO2 09/18/17 1202 93 %     Weight 09/18/17 1202 117 lb (53.1 kg)     Height 09/18/17 1202 5' (1.524 m)     Head Circumference --      [facility] Flow --      Pain Score 09/18/17 1211 5    Constitutional: Alert and oriented. Well appearing and in no distress. Eyes: Conjunctivae are normal.  ENT    Head: Normocephalic and atraumatic.   Nose: No congestion/rhinnorhea.   Mouth/Throat: Mucous membranes are moist.   Neck: No stridor. Hematological/Lymphatic/Immunilogical: No cervical lymphadenopathy. Cardiovascular: Normal rate, regular rhythm.  No murmurs, rubs, or gallops.  Respiratory: Normal respiratory effort without tachypnea nor retractions. Breath sounds are clear and equal bilaterally. No wheezes/rales/rhonchi. Gastrointestinal: Soft and non tender. No rebound. No guarding.  Genitourinary: Deferred Musculoskeletal: Right leg swelling, overlying erythema. Tender to palpation. No skin ulceration.  Neurologic:  Normal speech and language. No gross focal neurologic deficits are appreciated.  Skin:  Skin is warm, dry and intact. No rash noted. Psychiatric: Mood and affect are normal. Speech and behavior are normal. Patient exhibits appropriate insight and judgment.  ____________________________________________    LABS (pertinent positives/negatives)  CBC wbc 5.8, hgb 13.2, plt 168 CMP na 141, k 3.6, glu 119, cr 0.49  ____________________________________________   EKG  None  ____________________________________________    RADIOLOGY  US venous No DVT  ____________________________________________   PROCEDURES  Procedures  ____________________________________________   INITIAL IMPRESSION / ASSESSMENT AND PLAN / ED COURSE  Pertinent labs & imaging results that were available during my care of the patient were reviewed by me and considered in my medical decision making (see chart for details).  Patient presented to the emergency department today because of concerns for right leg swelling and pain.  Differential would include cellulitis, myositis, blood clot.  Ultrasound was negative for blood clot.  At this point I think cellulitis likely.  Will put patient on course of antibiotics.  Discussed importance of follow-up with  patient.  ____________________________________________   FINAL CLINICAL IMPRESSION(S) / ED DIAGNOSES  Final diagnoses:  Cellulitis, unspecified cellulitis site     Note: This dictation was prepared with Dragon dictation. Any transcriptional errors that result from this process are unintentional     Nance Pear, MD 09/18/17 1442

## 2017-09-18 NOTE — Discharge Instructions (Signed)
Recommend patient go to Emergency Department for further evaluation and management °

## 2017-09-18 NOTE — ED Triage Notes (Signed)
R lower leg swelling and pain x 2 weeks. Denies injury.

## 2017-09-18 NOTE — Discharge Instructions (Addendum)
Please seek medical attention for any high fevers, chest pain, shortness of breath, change in behavior, persistent vomiting, bloody stool or any other new or concerning symptoms.  

## 2017-09-18 NOTE — ED Triage Notes (Signed)
Pt with left lower leg ulcer x past year. Was being treated at the wound center for past year but hasn't been for past 2 months. Both legs are chronically reddened, but right calf swelling starting 2 weeks ago and painful to touch.

## 2017-09-18 NOTE — ED Provider Notes (Signed)
[facility]    CSN: [id number] Arrival date & time: 09/18/17  1008     History   Chief Complaint Chief Complaint  Patient presents with  . Leg Swelling    HPI Lindsey English is a 85 y.o. female.   84 yo female with chronic medical problems including diabetes and a h/o DVT presents with a 1-2 weeks h/o acute onset and progressively worsening right calf pain and swelling.  Patient reports a prior h/o DVT in same calf. Also c/o acute worsening of her chronic left leg ulcer, now bigger and with more drainage. Denies any fevers, chills, chest pains or shortness of breath.   The history is provided by the patient and the spouse.    Past Medical History:  Diagnosis Date  . Asthma   . Cancer ([place])    Right  . Diabetes mellitus without complication ([place])   . DVT (deep venous thrombosis) ([place])   . Hiatal hernia   . Hyperlipemia   . Hypertension   . Hyperthyroidism     Patient Active Problem List   Diagnosis Date Noted  . Lymphedema 04/20/2016  . Ulcer of ankle, left, limited to breakdown of skin ([place]) 04/20/2016  . Ulcer of ankle, right, limited to breakdown of skin ([place]) 04/20/2016  . Cellulitis 02/29/2016    Past Surgical History:  Procedure Laterality Date  . ABDOMINAL HYSTERECTOMY    . BREAST SURGERY Right   . TOTAL HIP ARTHROPLASTY Left     OB History   None      Home Medications    Prior to Admission medications   Medication Sig Start Date End Date Taking? Authorizing Provider  acetaminophen (TYLENOL) 500 MG tablet Take 500 mg by mouth every 4 (four) hours as needed for mild pain.     [provider]  albuterol (PROVENTIL HFA;VENTOLIN HFA) 108 (90 Base) MCG/ACT inhaler Inhale 2 puffs into the lungs every 4 (four) hours as needed for wheezing.    [provider]  alendronate (FOSAMAX) 70 MG tablet Take 70 mg by mouth every Wednesday.    [provider]  amLODipine (NORVASC) 5 MG tablet Take 5 mg by mouth daily.     [provider]  amoxicillin-clavulanate (AUGMENTIN) 875-125 MG tablet Take 1 tablet by mouth 2 (two) times daily. One po bid x 10 days 07/08/16   Drenda Freeze, MD  aspirin EC 81 MG tablet Take 81 mg by mouth daily.    [provider]  bisacodyl (DULCOLAX) 10 MG suppository Place 10 mg rectally daily as needed for moderate constipation.     [provider]  Calcium Carbonate-Vitamin D (CALCIUM 600+D) 600-200 MG-UNIT TABS Take 1 tablet by mouth daily.     [provider]  carvedilol (COREG) 12.5 MG tablet Take 12.5 mg by mouth 2 (two) times daily with a meal.    [provider]  cetirizine (ZYRTEC) 10 MG tablet Take 10 mg by mouth daily.    [provider]  cloNIDine (CATAPRES) 0.1 MG tablet Take 0.1 mg by mouth 2 (two) times daily.    [provider]  ferrous [id number] C-folic acid (TRINSICON / FOLTRIN) capsule Take 1 capsule by mouth daily before breakfast.    [provider]  furosemide (LASIX) 40 MG tablet Take 40 mg by mouth daily.    [provider]  gabapentin (NEURONTIN) 300 MG capsule Take 300 mg by mouth 2 (two) times daily.     [provider]  HYDROcodone-acetaminophen (NORCO/VICODIN) 5-325 MG tablet Take 1 tablet by mouth every 6 (six) hours as needed for moderate pain. 07/08/16   Drenda Freeze, MD  Multiple Vitamins-Minerals (CENTRUM SILVER PO) Take 1 tablet by mouth daily.    [provider]  oxyCODONE (OXY IR/ROXICODONE) 5 MG immediate release tablet Take 1 tablet (5 mg total) by mouth every 4 (four) hours as needed for moderate pain. 03/02/16   Hower, Aaron Mose, MD  potassium chloride SA (K-DUR,KLOR-CON) 20 MEQ tablet Take 40 mEq by mouth 3 (three) times daily.    [provider]  SANTYL ointment Apply 1 application topically 4 (four) times daily. 06/28/16   [provider]  simvastatin (ZOCOR) 10 MG tablet Take 10 mg by mouth at bedtime.    [provider]  SSD 1 % cream Apply 1 application topically 2 (two) times daily. 06/17/16   [provider]  tiotropium (SPIRIVA HANDIHALER) 18 MCG inhalation capsule PLACE CAPSULE IN CHAMBER, CLOSE, EXHALE, PUT CHAMBER TO MOUTH AND INHALE ONCE A DAY. 12/11/15   [provider]    Family History Family History  Problem Relation Age of Onset  . Hypertension Mother     Social History Social History   Tobacco Use  . Smoking status: Former Research scientist (life sciences)  . Smokeless tobacco: Never Used  Substance Use Topics  . Alcohol use: No  . Drug use: Not on file     Allergies   Shrimp [shellfish allergy]; Sulfa antibiotics; Clindamycin hcl; and Keflex [cephalexin]   Review of Systems Review of Systems   Physical Exam Triage Vital Signs ED Triage Vitals  Enc Vitals Group     BP 09/18/17 1033 (!) 201/72     Pulse Rate 09/18/17 1026 74     Resp 09/18/17 1026 18     Temp 09/18/17 1026 98.1 F (36.7 C)     Temp Source 09/18/17 1026 Oral     SpO2 09/18/17 1026 97 %     Weight 09/18/17 1028 117 lb (53.1 kg)     Height 09/18/17 1028 5\' 1"  (1.549 m)     Head Circumference --      [facility] --      Pain Score 09/18/17 1027 7     Pain Loc --      Pain Edu? --      Excl. in [place]? --    No data found.  Updated Vital Signs BP (!) 201/72 (BP Location: Left Arm) Comment: Pt has not been taking her b/p meds properl;y per husband  Pulse 74   Temp 98.1 F (36.7 C) (Oral)   Resp 18   Ht 5\' 1"  (1.549 m)   Wt 117 lb (53.1 kg)   SpO2 97%   BMI 22.11 kg/m   Visual Acuity Right Eye Distance:   Left Eye Distance:   Bilateral Distance:    Right Eye Near:   Left Eye Near:    Bilateral Near:     Physical Exam  Constitutional: She appears well-developed and well-nourished. No distress.  Musculoskeletal:       Right lower leg: She exhibits tenderness (along the right calf), swelling (along the right calft) and edema. She exhibits no bony tenderness.       Left lower leg: She  exhibits tenderness, bony tenderness and deformity (10 x 5 cm skin ulceration to lateral side of left lower leg with drainage present and surrounding skin tenderness and redness).  Skin: She is not diaphoretic.  Nursing note and  vitals reviewed.    UC Treatments / Results  Labs (all labs ordered are listed, but only abnormal results are displayed) Labs Reviewed - No data to display  EKG None Radiology No results found.  Procedures Procedures (including critical care time)  Medications Ordered in UC Medications - No data to display   Initial Impression / Assessment and Plan / UC Course  I have reviewed the triage vital signs and the nursing notes.  Pertinent labs & imaging results that were available during my care of the patient were reviewed by me and considered in my medical decision making (see chart for details).       Final Clinical Impressions(s) / UC Diagnoses   Final diagnoses:  Calf swelling  Right calf pain  Chronic ulcer of lower extremity, left, with unspecified severity [facility])    ED Discharge Orders    None     1. Possible (etiologies)/ diagnoses reviewed with patient and husband; recommend patient go to Emergency Department for further evaluation and management. Patient and husband verbalize understanding. Report called to triage RN at [facility] ED.   Controlled Substance Prescriptions [place] Controlled Substance Registry consulted? Not Applicable   Norval Gable, MD 09/18/17 1114

## 2018-05-28 ENCOUNTER — Emergency Department: Payer: Medicare Other

## 2018-05-28 ENCOUNTER — Encounter: Payer: Self-pay | Admitting: Emergency Medicine

## 2018-05-28 ENCOUNTER — Other Ambulatory Visit: Payer: Self-pay

## 2018-05-28 DIAGNOSIS — R627 Adult failure to thrive: Secondary | ICD-10-CM | POA: Diagnosis not present

## 2018-05-28 DIAGNOSIS — Z87891 Personal history of nicotine dependence: Secondary | ICD-10-CM | POA: Diagnosis not present

## 2018-05-28 DIAGNOSIS — I890 Lymphedema, not elsewhere classified: Secondary | ICD-10-CM | POA: Insufficient documentation

## 2018-05-28 DIAGNOSIS — E785 Hyperlipidemia, unspecified: Secondary | ICD-10-CM | POA: Insufficient documentation

## 2018-05-28 DIAGNOSIS — K567 Ileus, unspecified: Secondary | ICD-10-CM | POA: Insufficient documentation

## 2018-05-28 DIAGNOSIS — F0391 Unspecified dementia with behavioral disturbance: Secondary | ICD-10-CM | POA: Diagnosis not present

## 2018-05-28 DIAGNOSIS — I10 Essential (primary) hypertension: Secondary | ICD-10-CM | POA: Insufficient documentation

## 2018-05-28 DIAGNOSIS — Z9114 Patient's other noncompliance with medication regimen: Secondary | ICD-10-CM | POA: Diagnosis not present

## 2018-05-28 DIAGNOSIS — F4321 Adjustment disorder with depressed mood: Secondary | ICD-10-CM | POA: Diagnosis not present

## 2018-05-28 DIAGNOSIS — Z23 Encounter for immunization: Secondary | ICD-10-CM | POA: Diagnosis not present

## 2018-05-28 DIAGNOSIS — Z79899 Other long term (current) drug therapy: Secondary | ICD-10-CM | POA: Diagnosis not present

## 2018-05-28 DIAGNOSIS — Z7982 Long term (current) use of aspirin: Secondary | ICD-10-CM | POA: Insufficient documentation

## 2018-05-28 DIAGNOSIS — E876 Hypokalemia: Secondary | ICD-10-CM | POA: Insufficient documentation

## 2018-05-28 DIAGNOSIS — Z86718 Personal history of other venous thrombosis and embolism: Secondary | ICD-10-CM | POA: Insufficient documentation

## 2018-05-28 DIAGNOSIS — F22 Delusional disorders: Secondary | ICD-10-CM | POA: Diagnosis not present

## 2018-05-28 DIAGNOSIS — E440 Moderate protein-calorie malnutrition: Secondary | ICD-10-CM | POA: Diagnosis not present

## 2018-05-28 DIAGNOSIS — J45909 Unspecified asthma, uncomplicated: Secondary | ICD-10-CM | POA: Insufficient documentation

## 2018-05-28 DIAGNOSIS — Z79891 Long term (current) use of opiate analgesic: Secondary | ICD-10-CM | POA: Diagnosis not present

## 2018-05-28 DIAGNOSIS — R109 Unspecified abdominal pain: Secondary | ICD-10-CM | POA: Diagnosis present

## 2018-05-28 DIAGNOSIS — E119 Type 2 diabetes mellitus without complications: Secondary | ICD-10-CM | POA: Insufficient documentation

## 2018-05-28 LAB — CBC
HCT: 38.6 % (ref 36.0–46.0)
Hemoglobin: 11.7 g/dL — ABNORMAL LOW (ref 12.0–15.0)
MCH: 26.4 pg (ref 26.0–34.0)
MCHC: 30.3 g/dL (ref 30.0–36.0)
MCV: 87.1 fL (ref 80.0–100.0)
NRBC: 0 % (ref 0.0–0.2)
PLATELETS: 175 10*3/uL (ref 150–400)
RBC: 4.43 MIL/uL (ref 3.87–5.11)
RDW: 12.7 % (ref 11.5–15.5)
WBC: 5.7 10*3/uL (ref 4.0–10.5)

## 2018-05-28 LAB — BASIC METABOLIC PANEL
ANION GAP: 8 (ref 5–15)
BUN: 8 mg/dL (ref 8–23)
CALCIUM: 8.9 mg/dL (ref 8.9–10.3)
CO2: 29 mmol/L (ref 22–32)
CREATININE: 0.5 mg/dL (ref 0.44–1.00)
Chloride: 104 mmol/L (ref 98–111)
Glucose, Bld: 110 mg/dL — ABNORMAL HIGH (ref 70–99)
POTASSIUM: 3.5 mmol/L (ref 3.5–5.1)
Sodium: 141 mmol/L (ref 135–145)

## 2018-05-28 LAB — TROPONIN I

## 2018-05-28 NOTE — ED Triage Notes (Signed)
Pt to triage via w/c with no distress noted accomp by son who reports pt with HTN and c/o HA intermittently for several wks (recent death of spouse) but pt refused to go to doctor, hx dementia

## 2018-05-29 ENCOUNTER — Emergency Department: Payer: Medicare Other

## 2018-05-29 ENCOUNTER — Observation Stay
Admission: EM | Admit: 2018-05-29 | Discharge: 2018-06-01 | Disposition: A | Discharge status: Skilled Nursing Facility | Payer: Medicare Other | Attending: Internal Medicine | Admitting: Internal Medicine

## 2018-05-29 DIAGNOSIS — K567 Ileus, unspecified: Secondary | ICD-10-CM

## 2018-05-29 DIAGNOSIS — I890 Lymphedema, not elsewhere classified: Secondary | ICD-10-CM

## 2018-05-29 DIAGNOSIS — I10 Essential (primary) hypertension: Secondary | ICD-10-CM

## 2018-05-29 DIAGNOSIS — F0390 Unspecified dementia without behavioral disturbance: Secondary | ICD-10-CM | POA: Diagnosis present

## 2018-05-29 DIAGNOSIS — F0391 Unspecified dementia with behavioral disturbance: Secondary | ICD-10-CM

## 2018-05-29 DIAGNOSIS — F22 Delusional disorders: Secondary | ICD-10-CM

## 2018-05-29 DIAGNOSIS — E440 Moderate protein-calorie malnutrition: Secondary | ICD-10-CM

## 2018-05-29 DIAGNOSIS — F4321 Adjustment disorder with depressed mood: Secondary | ICD-10-CM

## 2018-05-29 DIAGNOSIS — R6251 Failure to thrive (child): Secondary | ICD-10-CM | POA: Diagnosis present

## 2018-05-29 LAB — URINALYSIS, COMPLETE (UACMP) WITH MICROSCOPIC
Bacteria, UA: NONE SEEN
Bilirubin Urine: NEGATIVE
GLUCOSE, UA: NEGATIVE mg/dL
HGB URINE DIPSTICK: NEGATIVE
Ketones, ur: 5 mg/dL — AB
NITRITE: NEGATIVE
PH: 7 (ref 5.0–8.0)
Protein, ur: NEGATIVE mg/dL
SPECIFIC GRAVITY, URINE: 1.003 — AB (ref 1.005–1.030)

## 2018-05-29 LAB — CG4 I-STAT (LACTIC ACID): Lactic Acid, Venous: 0.84 mmol/L (ref 0.5–1.9)

## 2018-05-29 MED ORDER — SIMVASTATIN 20 MG PO TABS
10.0000 mg | ORAL_TABLET | Freq: Every day | ORAL | Status: DC
  Administered 2018-05-29 – 2018-05-31 (×4): 10 mg via ORAL
  Filled 2018-05-29 (×4): qty 1

## 2018-05-29 MED ORDER — ASPIRIN EC 81 MG PO TBEC
81.0000 mg | DELAYED_RELEASE_TABLET | Freq: Every day | ORAL | Status: DC
  Administered 2018-05-29 – 2018-06-01 (×4): 81 mg via ORAL
  Filled 2018-05-29 (×4): qty 1

## 2018-05-29 MED ORDER — CARVEDILOL 3.125 MG PO TABS
12.5000 mg | ORAL_TABLET | Freq: Two times a day (BID) | ORAL | Status: DC
  Administered 2018-05-29 – 2018-06-01 (×7): 12.5 mg via ORAL
  Filled 2018-05-29: qty 4
  Filled 2018-05-29 (×2): qty 2
  Filled 2018-05-29: qty 4
  Filled 2018-05-29: qty 2
  Filled 2018-05-29: qty 4
  Filled 2018-05-29: qty 2

## 2018-05-29 MED ORDER — GABAPENTIN 300 MG PO CAPS
300.0000 mg | ORAL_CAPSULE | Freq: Two times a day (BID) | ORAL | Status: DC
  Administered 2018-05-29 – 2018-06-01 (×6): 300 mg via ORAL
  Filled 2018-05-29 (×11): qty 1

## 2018-05-29 MED ORDER — FUROSEMIDE 40 MG PO TABS
40.0000 mg | ORAL_TABLET | Freq: Every day | ORAL | Status: DC
  Administered 2018-05-29 – 2018-05-30 (×2): 40 mg via ORAL
  Filled 2018-05-29 (×2): qty 1

## 2018-05-29 MED ORDER — AMLODIPINE BESYLATE 5 MG PO TABS
5.0000 mg | ORAL_TABLET | Freq: Every day | ORAL | Status: DC
  Administered 2018-05-29 – 2018-05-31 (×3): 5 mg via ORAL
  Filled 2018-05-29 (×3): qty 1

## 2018-05-29 MED ORDER — ACETAMINOPHEN 325 MG PO TABS
650.0000 mg | ORAL_TABLET | Freq: Once | ORAL | Status: AC
  Administered 2018-05-29: 650 mg via ORAL
  Filled 2018-05-29: qty 2

## 2018-05-29 MED ORDER — CLONIDINE HCL 0.1 MG PO TABS
0.1000 mg | ORAL_TABLET | Freq: Two times a day (BID) | ORAL | Status: DC
  Administered 2018-05-29 – 2018-06-01 (×8): 0.1 mg via ORAL
  Filled 2018-05-29 (×8): qty 1

## 2018-05-29 MED ORDER — TIOTROPIUM BROMIDE MONOHYDRATE 18 MCG IN CAPS
18.0000 ug | ORAL_CAPSULE | Freq: Every morning | RESPIRATORY_TRACT | Status: DC
  Administered 2018-05-29: 18 ug via RESPIRATORY_TRACT
  Filled 2018-05-29 (×2): qty 5

## 2018-05-29 NOTE — Consult Note (Signed)
[place] Nurse wound consult note Reason for Consult: RLE wound, unable to obtain history. No family at bedside.  Patient is not a reliable historian Wound type: unclear etiology, patient has skin changes on the dorsal foot and lateral malleolus. Partial thickness ulceration Pressure Injury POA: NA Measurement:8.5cm x 3.5cm x 0.1cm  Wound bed: dry, ruddy ulceration. Skin changes laterally thicker hyperkeratotic skin, but not typical venous stasis Drainage (amount, consistency, odor) minimal, some noted on the stretcher sheets. Has odor Periwound: erythema, neg for edema, palpable strong distal pulses   Dressing procedure/placement/frequency: Xeroform for non adherent, antibacterial effects. Dry topper dressing and ACE wrap to secure dressing.  Change daily.  Needs follow up with wound care center of her choice or primary care MD to follow up on etiology and further work up.  Discussed POC with bedside nurse.  Re consult if needed, will not follow at this time. Thanks  [name] [employer], RN,CWOCN, CNS, [place] [phone])

## 2018-05-29 NOTE — Clinical Social Work Note (Signed)
CSW spoke with patient's son Elaysia Devargas [phone] and explained that [facility] is the only facility available for patient. Son is in agreement. [facility] is trying to get insurance coverage through [facility] first and if they deny coverage then patient can go under private pay. CSW will continue to follow for discharge planning.   [place], [place]

## 2018-05-29 NOTE — ED Notes (Signed)
Pt dressed out into burgundy scrubs. Belongings included navy slacks, blue blouse, tan sweater, bobby pins in a zip lock bag, pink bedroom slippers. Placed in belongings bag labeled and placed in BHU lock up.

## 2018-05-29 NOTE — NC FL2 (Addendum)
[place] LEVEL OF CARE SCREENING TOOL     IDENTIFICATION  Patient Name: Lindsey English Birthdate: August 29, 1929 Sex: female Admission Date (Current Location): 05/29/2018  [place] and [place] Number:  Engineering geologist and Address:  [facility], [address] [place], [place] [address]      Provider Number: [id number]  Attending Physician Name and Address:  No att. providers found  Relative Name and Phone Number:       Current Level of Care: Hospital Recommended Level of Care: [place] Prior Approval Number:    Date Approved/Denied:   PASRR Number: [id number]  Discharge Plan: SNF    Current Diagnoses: Patient Active Problem List   Diagnosis Date Noted  . Lymphedema 04/20/2016  . Ulcer of ankle, left, limited to breakdown of skin ([place]) 04/20/2016  . Ulcer of ankle, right, limited to breakdown of skin ([place]) 04/20/2016  . Cellulitis 02/29/2016    Orientation RESPIRATION BLADDER Height & Weight     Self, Place  Normal Incontinent Weight: 121 lb 9.6 oz (55.2 kg) Height:     BEHAVIORAL SYMPTOMS/MOOD NEUROLOGICAL BOWEL NUTRITION STATUS  (none) (none) Continent Diet(regular)  AMBULATORY STATUS COMMUNICATION OF NEEDS Skin   Extensive Assist Verbally Normal                       Personal Care Assistance Level of Assistance  Bathing, Feeding, Dressing Bathing Assistance: Limited assistance Feeding assistance: Independent Dressing Assistance: Limited assistance     Functional Limitations Info  Sight, Hearing, Speech Sight Info: Adequate Hearing Info: Adequate Speech Info: Adequate    SPECIAL CARE FACTORS FREQUENCY  PT (By licensed PT), OT (By licensed OT)     PT Frequency: 5 OT Frequency: 5            Contractures Contractures Info: Not present    Additional Factors Info  Code Status, Allergies Code Status Info: Full Code  Allergies Info: Clindamycin Hcl, Shrimp, Sulfa Antibiotics, Keflex,  Vancomycin           Current Medications (05/29/2018):  This is the current hospital active medication list Current Facility-Administered Medications  Medication Dose Route Frequency Provider Last Rate Last Dose  . amLODipine (NORVASC) tablet 5 mg  5 mg Oral Daily Paulette Blanch, MD   5 mg at 05/29/18 1031  . aspirin EC tablet 81 mg  81 mg Oral Daily Paulette Blanch, MD   81 mg at 05/29/18 1031  . carvedilol (COREG) tablet 12.5 mg  12.5 mg Oral BID WC Paulette Blanch, MD   12.5 mg at 05/29/18 0900  . cloNIDine (CATAPRES) tablet 0.1 mg  0.1 mg Oral BID Paulette Blanch, MD   0.1 mg at 05/29/18 1031  . furosemide (LASIX) tablet 40 mg  40 mg Oral Daily Paulette Blanch, MD   40 mg at 05/29/18 1032  . gabapentin (NEURONTIN) capsule 300 mg  300 mg Oral BID Paulette Blanch, MD   300 mg at 05/29/18 2353  . simvastatin (ZOCOR) tablet 10 mg  10 mg Oral QHS Paulette Blanch, MD   10 mg at 05/29/18 0511  . tiotropium (SPIRIVA) inhalation capsule ([facility] use ONLY) 18 mcg  18 mcg Inhalation q morning - 10a Paulette Blanch, MD   18 mcg at 05/29/18 6144   Current Outpatient Medications  Medication Sig Dispense Refill  . acetaminophen (TYLENOL) 500 MG tablet Take 500 mg by mouth every 4 (four) hours as  needed for mild pain.     Marland Kitchen albuterol (PROVENTIL HFA;VENTOLIN HFA) 108 (90 Base) MCG/ACT inhaler Inhale 2 puffs into the lungs every 4 (four) hours as needed for wheezing.    Marland Kitchen alendronate (FOSAMAX) 70 MG tablet Take 70 mg by mouth every Wednesday.    Marland Kitchen amLODipine (NORVASC) 5 MG tablet Take 5 mg by mouth daily.    Marland Kitchen amoxicillin-clavulanate (AUGMENTIN) 875-125 MG tablet Take 1 tablet by mouth 2 (two) times daily. One po bid x 10 days 20 tablet 0  . aspirin EC 81 MG tablet Take 81 mg by mouth daily.    . bisacodyl (DULCOLAX) 10 MG suppository Place 10 mg rectally daily as needed for moderate constipation.     . Calcium Carbonate-Vitamin D (CALCIUM 600+D) 600-200 MG-UNIT TABS Take 1 tablet by mouth daily.     . carvedilol (COREG) 12.5 MG  tablet Take 12.5 mg by mouth 2 (two) times daily with a meal.    . cetirizine (ZYRTEC) 10 MG tablet Take 10 mg by mouth daily.    . cloNIDine (CATAPRES) 0.1 MG tablet Take 0.1 mg by mouth 2 (two) times daily.    . ferrous [id number] C-folic acid (TRINSICON / FOLTRIN) capsule Take 1 capsule by mouth daily before breakfast.    . furosemide (LASIX) 40 MG tablet Take 40 mg by mouth daily.    Marland Kitchen gabapentin (NEURONTIN) 300 MG capsule Take 300 mg by mouth 2 (two) times daily.     Marland Kitchen HYDROcodone-acetaminophen (NORCO/VICODIN) 5-325 MG tablet Take 1 tablet by mouth every 6 (six) hours as needed for moderate pain. 10 tablet 0  . Multiple Vitamins-Minerals (CENTRUM SILVER PO) Take 1 tablet by mouth daily.    Marland Kitchen oxyCODONE (OXY IR/ROXICODONE) 5 MG immediate release tablet Take 1 tablet (5 mg total) by mouth every 4 (four) hours as needed for moderate pain. 30 tablet 0  . potassium chloride SA (K-DUR,KLOR-CON) 20 MEQ tablet Take 40 mEq by mouth 3 (three) times daily.    Marland Kitchen SANTYL ointment Apply 1 application topically 4 (four) times daily.    . simvastatin (ZOCOR) 10 MG tablet Take 10 mg by mouth at bedtime.    . SSD 1 % cream Apply 1 application topically 2 (two) times daily.    Marland Kitchen tiotropium (SPIRIVA HANDIHALER) 18 MCG inhalation capsule PLACE CAPSULE IN CHAMBER, CLOSE, EXHALE, PUT CHAMBER TO MOUTH AND INHALE ONCE A DAY.       Discharge Medications: Please see discharge summary for a list of discharge medications.  Relevant Imaging Results:  Relevant Lab Results:   Additional Information SSN: [id number]  Annamaria Boots, [place]

## 2018-05-29 NOTE — ED Notes (Signed)
Pt given graham crackers, peanut butter and grape juice as a snack.

## 2018-05-29 NOTE — ED Notes (Signed)
Patient visiting with family 2 sons and daughter

## 2018-05-29 NOTE — ED Provider Notes (Signed)
[facility] Emergency Department Provider Note   ____________________________________________   First MD Initiated Contact with Patient 05/29/18 0107     (approximate)  I have reviewed the triage vital signs and the nursing notes.   HISTORY  Chief Complaint Hypertension    HPI Lindsey English is a 85 y.o. female brought to the ED from home by her sons with a chief complaint of worsening dementia with behavioral disturbance, elevated blood pressure because patient refuses to take her medicines, wandering away from the house and paranoia.  Son states patient's dementia has been worsening for the past several months.  They have been busy taking care of of their father who died last week of cancer.  Although patient has caregivers, she has been found wandering a mile away from the house.  She has left the stove on several times.  She has gotten up at 3 AM to make breakfast.  She is paranoid.  Thinks her children are trying to eat her.  Has stopped taking all of her medications with the exception of clonidine.  Also patient has had an ulcer on her left lower leg for months.  Refuses to go to the doctor.  Symptoms have been applying antibiotic ointment and a dressing.  Patient herself voices no complaints.  Specifically, patient denies fever, chills, headache, vision changes, neck pain, chest pain, shortness of breath, abdominal pain, nausea, vomiting, dysuria, diarrhea.  Denies recent travel or trauma.  Denies use of anticoagulants.   Past Medical History:  Diagnosis Date  . Asthma   . Cancer ([place])    Right  . Diabetes mellitus without complication ([place])   . DVT (deep venous thrombosis) ([place])   . Hiatal hernia   . Hyperlipemia   . Hypertension   . Hyperthyroidism     Patient Active Problem List   Diagnosis Date Noted  . Lymphedema 04/20/2016  . Ulcer of ankle, left, limited to breakdown of skin ([place]) 04/20/2016  . Ulcer of ankle, right, limited to breakdown  of skin ([place]) 04/20/2016  . Cellulitis 02/29/2016    Past Surgical History:  Procedure Laterality Date  . ABDOMINAL HYSTERECTOMY    . BREAST SURGERY Right   . TOTAL HIP ARTHROPLASTY Left     Prior to Admission medications   Medication Sig Start Date End Date Taking? Authorizing Provider  acetaminophen (TYLENOL) 500 MG tablet Take 500 mg by mouth every 4 (four) hours as needed for mild pain.     [provider]  albuterol (PROVENTIL HFA;VENTOLIN HFA) 108 (90 Base) MCG/ACT inhaler Inhale 2 puffs into the lungs every 4 (four) hours as needed for wheezing.    [provider]  alendronate (FOSAMAX) 70 MG tablet Take 70 mg by mouth every Wednesday.    [provider]  amLODipine (NORVASC) 5 MG tablet Take 5 mg by mouth daily.    [provider]  amoxicillin-clavulanate (AUGMENTIN) 875-125 MG tablet Take 1 tablet by mouth 2 (two) times daily. One po bid x 10 days 07/08/16   Drenda Freeze, MD  aspirin EC 81 MG tablet Take 81 mg by mouth daily.    [provider]  bisacodyl (DULCOLAX) 10 MG suppository Place 10 mg rectally daily as needed for moderate constipation.     [provider]  Calcium Carbonate-Vitamin D (CALCIUM 600+D) 600-200 MG-UNIT TABS Take 1 tablet by mouth daily.     [provider]  carvedilol (COREG) 12.5 MG tablet Take 12.5 mg by mouth 2 (  two) times daily with a meal.    [provider]  cetirizine (ZYRTEC) 10 MG tablet Take 10 mg by mouth daily.    [provider]  cloNIDine (CATAPRES) 0.1 MG tablet Take 0.1 mg by mouth 2 (two) times daily.    [provider]  ferrous [id number] C-folic acid (TRINSICON / FOLTRIN) capsule Take 1 capsule by mouth daily before breakfast.    [provider]  furosemide (LASIX) 40 MG tablet Take 40 mg by mouth daily.    [provider]  gabapentin (NEURONTIN) 300 MG capsule Take 300 mg by mouth 2 (two) times daily.     [provider]  HYDROcodone-acetaminophen (NORCO/VICODIN) 5-325 MG tablet Take 1 tablet by mouth every 6 (six) hours as needed for moderate pain. 07/08/16   Drenda Freeze, MD  Multiple Vitamins-Minerals (CENTRUM SILVER PO) Take 1 tablet by mouth daily.    [provider]  oxyCODONE (OXY IR/ROXICODONE) 5 MG immediate release tablet Take 1 tablet (5 mg total) by mouth every 4 (four) hours as needed for moderate pain. 03/02/16   Hower, Aaron Mose, MD  potassium chloride SA (K-DUR,KLOR-CON) 20 MEQ tablet Take 40 mEq by mouth 3 (three) times daily.    [provider]  SANTYL ointment Apply 1 application topically 4 (four) times daily. 06/28/16   [provider]  simvastatin (ZOCOR) 10 MG tablet Take 10 mg by mouth at bedtime.    [provider]  SSD 1 % cream Apply 1 application topically 2 (two) times daily. 06/17/16   [provider]  tiotropium (SPIRIVA HANDIHALER) 18 MCG inhalation capsule PLACE CAPSULE IN CHAMBER, CLOSE, EXHALE, PUT CHAMBER TO MOUTH AND INHALE ONCE A DAY. 12/11/15   [provider]    Allergies Shrimp [shellfish allergy]; Sulfa antibiotics; Clindamycin hcl; and Keflex [cephalexin]  Family History  Problem Relation Age of Onset  . Hypertension Mother     Social History Social History   Tobacco Use  . Smoking status: Former Research scientist (life sciences)  . Smokeless tobacco: Never Used  Substance Use Topics  . Alcohol use: No  . Drug use: Not on file    Review of Systems  Constitutional: No fever/chills Eyes: No visual changes. ENT: No sore throat. Cardiovascular: Denies chest pain. Respiratory: Denies shortness of breath. Gastrointestinal: No abdominal pain.  No nausea, no vomiting.  No diarrhea.  No constipation. Genitourinary: Negative for dysuria. Musculoskeletal: Negative for back pain. Skin: Negative for rash. Neurological: Negative for headaches, focal weakness or numbness. Psychiatric:Positive for dementia with behavioral  disturbance.  ____________________________________________   PHYSICAL EXAM:  VITAL SIGNS: ED Triage Vitals  Enc Vitals Group     BP 05/28/18 2106 (!) 198/64     Pulse Rate 05/28/18 2106 (!) 48     Resp 05/28/18 2106 18     Temp 05/28/18 2106 97.6 F (36.4 C)     Temp Source 05/28/18 2106 Oral     SpO2 05/28/18 2106 99 %     Weight 05/28/18 2149 121 lb 9.6 oz (55.2 kg)     Height --      Head Circumference --      Peak Flow --      Pain Score --      Pain Loc --      Pain Edu? --      Excl. in [place]? --     Constitutional: Alert and oriented. Well appearing and in no acute distress. Eyes: Conjunctivae are normal. PERRL. EOMI. Head: Atraumatic.  Nose: No congestion/rhinnorhea. Mouth/Throat: Mucous membranes are moist.  Oropharynx non-erythematous. Neck: No stridor.  No carotid bruits.  Supple neck without meningismus. Cardiovascular: Bradycardic rate, regular rhythm. Grossly normal heart sounds.  Good peripheral circulation. Respiratory: Normal respiratory effort.  No retractions. Lungs CTAB. Gastrointestinal: Soft and nontender. No distention. No abdominal bruits. No CVA tenderness. Musculoskeletal:  LLE: Venous stasis ulcer to lateral aspect of lower leg.  Some drainage on the dressing.  No surrounding warmth or erythema.  No fluctuance or abscess.  Palpable distal pulses.  5-second capillary refill. Neurologic:  Normal speech and language. No gross focal neurologic deficits are appreciated. No gait instability. Skin:  Skin is warm, dry and intact. No rash noted. Psychiatric: Mood and affect are normal. Speech and behavior are normal.  ____________________________________________   LABS (all labs ordered are listed, but only abnormal results are displayed)  Labs Reviewed  CBC - Abnormal; Notable for the following components:      Result Value   Hemoglobin 11.7 (*)    All other components within normal limits  BASIC METABOLIC PANEL - Abnormal; Notable for the following  components:   Glucose, Bld 110 (*)    All other components within normal limits  URINALYSIS, COMPLETE (UACMP) WITH MICROSCOPIC - Abnormal; Notable for the following components:   Color, Urine STRAW (*)    APPearance CLEAR (*)    Specific Gravity, Urine 1.003 (*)    Ketones, ur 5 (*)    Leukocytes, UA TRACE (*)    All other components within normal limits  URINE CULTURE  TROPONIN I  I-STAT CG4 LACTIC ACID, ED   ____________________________________________  EKG  ED ECG REPORT I, , J, the attending physician, personally viewed and interpreted this ECG.   Date: 05/29/2018  EKG Time: 2114  Rate: 47  Rhythm: sinus bradycardia  Axis: Normal  Intervals:none  ST&T Change: Nonspecific  ____________________________________________  RADIOLOGY  ED MD interpretation: No ICH; mild vascular congestion on chest x-ray  Official radiology report(s): Dg Chest 2 View  Result Date: 05/29/2018 CLINICAL DATA:  Elevated blood pressure. EXAM: CHEST - 2 VIEW COMPARISON:  AP view 02/29/2016 FINDINGS: Chronic cardiomegaly with slight progression from prior. Aortic atherosclerosis and tortuosity. Vascular congestion without overt edema. No focal airspace disease, pleural effusion or pneumothorax. No acute osseous abnormalities. IMPRESSION: 1. Slight increased cardiomegaly from 2017.  Vascular congestion. 2.  Aortic Atherosclerosis (ICD10-I70.0). Electronically Signed   By: Keith Rake M.D.   On: 05/29/2018 02:02   Ct Head Wo Contrast  Result Date: 05/29/2018 CLINICAL DATA:  Headache intermittently for several weeks. Dementia. EXAM: CT HEAD WITHOUT CONTRAST TECHNIQUE: Contiguous axial images were obtained from the base of the skull through the vertex without intravenous contrast. COMPARISON:  12/26/2013 FINDINGS: BRAIN: There is sulcal and ventricular prominence consistent with superficial and central atrophy. No intraparenchymal hemorrhage, mass effect nor midline shift. Periventricular and  subcortical white matter hypodensities consistent with chronic moderate small vessel ischemic disease are redemonstrated. No acute large vascular territory infarcts. No abnormal extra-axial fluid collections. Basal cisterns are not effaced and midline. VASCULAR: Moderate calcific atherosclerosis of the carotid siphons. SKULL: No skull fracture. No significant scalp soft tissue swelling. Osteoarthritis of the left TMJ with joint space narrowing and spurring. SINUSES/ORBITS: The mastoid air-cells are clear. The included paranasal sinuses are well-aerated.The included ocular globes and orbital contents are non-suspicious. OTHER: None. IMPRESSION: Atrophy with chronic appearing small vessel ischemic disease. No acute intracranial abnormality. Electronically Signed   By: Meredith Leeds.D.  On: 05/29/2018 00:36    ____________________________________________   PROCEDURES  Procedure(s) performed: None  Procedures  Critical Care performed: No  ____________________________________________   INITIAL IMPRESSION / ASSESSMENT AND PLAN / ED COURSE  As part of my medical decision making, I reviewed the following data within the electronic MEDICAL RECORD NUMBER History obtained from family, Nursing notes reviewed and incorporated, Labs reviewed, EKG interpreted, Old chart reviewed, Radiograph reviewed, A consult was requested and obtained from this/these consultant(s) Psychiatry and Notes from prior ED visits   85 year old female brought to the ED by her family for worsening dementia, now with behavioral disturbance, paranoia, refusing to take her medications.  I had a long discussion with her family; patient is at the point where she is a danger to herself.  She does not meet medical admission criteria.  She is makes her own medical decisions.  Will place patient under involuntary commitment for her safety pending psychiatric consultation.  Will consult social work for placement options.  Will order patient's  home medications.   Clinical Course as of May 29 718  Tue May 29, 2018  0718 I-Stat lactate was 0.8; no further events.  Consult to wound care for left lower leg ulcer.  Trace leukocytes on urinalysis without symptoms.  Will add urine culture.  Patient remains in the ED under IVC pending [facility] psychiatry evaluation.  Consults also to clinical social work and physical therapy for placement.   [JS]    Clinical Course User Index [JS] Paulette Blanch, MD     ____________________________________________   FINAL CLINICAL IMPRESSION(S) / ED DIAGNOSES  Final diagnoses:  Essential hypertension  Dementia with behavioral disturbance, unspecified dementia type [facility])     ED Discharge Orders    None       Note:  This document was prepared using Dragon voice recognition software and may include unintentional dictation errors.    Paulette Blanch, MD 05/29/18 [phone]

## 2018-05-29 NOTE — ED Notes (Signed)
I-stat lactic results= 0.84

## 2018-05-29 NOTE — Clinical Social Work Note (Signed)
CSW spoke with patient's son regarding discharge disposition. Son states that they are trying to place patient in a facility for a week or two in order to make a more long term plan for patient. CSW explained that therapy saw patient and recommends home with home health. CSW explained that with this recommendation, her insurance is unlikely to cover SNF. Son states understanding and is willing to pay out of pocket if needed. CSW will do bed search and see if insurance will approve. CSW also provided son with a list of locked memory care facilities in the area for future use. Son states that he and his siblings are in the process of getting guardianship for patient and plan to look into an assisted living for long term care for patient. CSW will continue to follow for discharge planning.   [place], Gisela

## 2018-05-29 NOTE — ED Notes (Signed)
Patient talking to [facility]

## 2018-05-29 NOTE — ED Notes (Signed)
Patient observed lying in bed with eyes closed  Even, unlabored respirations observed NAD pt appears to be sleeping q15 minute visual observations and ongoing security monitoring
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
## 2018-05-29 NOTE — Evaluation (Addendum)
Physical Therapy Evaluation Patient Details Name: Lindsey English MRN: [id number] DOB: 01/25/1930 Today's Date: 05/29/2018   History of Present Illness  Pt is 85 yo female brought to ED by sons for worsening dementia with behavioral disturbances, medication non-compliance, lower leg wound.  Clinical Impression  Pt in bed resting at start of session, oriented to self, did exhibit restlessness/anxiety, tangential, sporadic conversation throughout but overall participated well with PT. Family not in room to verify PLOF, but pt reported ambulating with a walker at baseline, endorses that she recently lost her husband. Lives in two story home, able to live on the first floor, and that her children/family assist as needed.  Pt demonstrated bed mobility mod I, sit <> stand with handheld assist/RW and CGA. Pt ambulated ~64ft during session with CGA, initially without AD, improved balance, gait velocity with RW. Ambulated with trunk flexed, narrow base of support but overall steady. The patient needed verbal redirecting/visual cues to attend to task throughout session and to promote safety with mobility. Pt complained of SOB, spO2 85-93% on room air while ambulating though reading uncertain, HR in the 716R. Nursing notified and in room to provide medication, in bed spO2 >90% HR high 90s. The patient would benefit from further skilled PT to address limitations (see "PT Problem List") that impede patients mobility, safety, and independence. Recommendation is for HHPT with supervision 24/7 for safety due to cognitive status.     Follow Up Recommendations Home health PT;Supervision/Assistance - 24 hour    Equipment Recommendations  Rolling walker with 5" wheels    Recommendations for Other Services       Precautions / Restrictions Precautions Precautions: Fall Restrictions Weight Bearing Restrictions: No      Mobility  Bed Mobility Overal bed mobility: Modified Independent                 Transfers Overall transfer level: Needs assistance Equipment used: Rolling walker (2 wheeled) Transfers: Sit to/from Stand Sit to Stand: Min guard            Ambulation/Gait Ambulation/Gait assistance: Min guard Gait Distance (Feet): 50 Feet Assistive device: Rolling walker (2 wheeled) Gait Pattern/deviations: Trunk flexed;Shuffle Gait velocity: decreased   General Gait Details: Pt complained of wheezing, exhibited mild SOB during mobility. HR in 120's, spO2 85-93% on room air.  Stairs            Wheelchair Mobility    Modified Rankin (Stroke Patients Only)       Balance Overall balance assessment: Needs assistance Sitting-balance support: Feet supported Sitting balance-Leahy Scale: Good       Standing balance-Leahy Scale: Poor Standing balance comment: Pt able to ambulate a few steps without a walker and handheld assist. Much improved balance with RW.                             Pertinent Vitals/Pain Pain Assessment: Faces Faces Pain Scale: Hurts a little bit Pain Location: with mobility Pain Descriptors / Indicators: Grimacing;Guarding Pain Intervention(s): Limited activity within patient's tolerance    Home Living Family/patient expects to be discharged to:: Private residence   Available Help at Discharge: Family Type of Home: House       Home Layout: Two level;Able to live on main level with bedroom/bathroom Home Equipment: Grab bars - toilet;Grab bars - tub/shower Additional Comments: Pt is poor historian due to dementia. Able to report that she lives in two story house, lives on the first  floor, recently lost her husband. Stated her children assist with household chores, and that she walks with a walker (unsure of what kind).     Prior Function           Comments: Pt stated she has not fallen in the last 6 months.      Hand Dominance        Extremity/Trunk Assessment   Upper Extremity Assessment Upper Extremity  Assessment: Generalized weakness;Difficult to assess due to impaired cognition    Lower Extremity Assessment Lower Extremity Assessment: Generalized weakness;Difficult to assess due to impaired cognition       Communication   Communication: HOH  Cognition Arousal/Alertness: Awake/alert Behavior During Therapy: Restless;Anxious Overall Cognitive Status: No family/caregiver present to determine baseline cognitive functioning                                 General Comments: oriented to self      General Comments      Exercises Other Exercises Other Exercises: Pt performed commode transfer with CGA and use of grab bars. able to perform toileting activities with CGA.   Assessment/Plan    PT Assessment Patient needs continued PT services  PT Problem List Decreased strength;Decreased cognition;Decreased activity tolerance;Decreased balance;Decreased safety awareness;Decreased mobility;Decreased knowledge of precautions       PT Treatment Interventions Therapeutic exercise;DME instruction;Gait training;Balance training;Stair training;Neuromuscular re-education;Functional mobility training;Therapeutic activities;Patient/family education    PT Goals (Current goals can be found in the Care Plan section)  Acute Rehab PT Goals Patient Stated Goal: Pt wants to rest PT Goal Formulation: With patient Time For Goal Achievement: 06/12/18 Potential to Achieve Goals: Good    Frequency Min 2X/week   Barriers to discharge        Co-evaluation               AM-PAC PT "6 Clicks" Mobility  Outcome Measure Help needed turning from your back to your side while in a flat bed without using bedrails?: None Help needed moving from lying on your back to sitting on the side of a flat bed without using bedrails?: None Help needed moving to and from a bed to a chair (including a wheelchair)?: A Little Help needed standing up from a chair using your arms (e.g., wheelchair or  bedside chair)?: A Little Help needed to walk in hospital room?: A Little Help needed climbing 3-5 steps with a railing? : A Lot 6 Click Score: 19    End of Session Equipment Utilized During Treatment: Gait belt Activity Tolerance: Patient tolerated treatment well Patient left: in bed;Other (comment)(RN notified of status, in bed with all needs in reach, instructed to call for help if needed anything) Nurse Communication: Mobility status;Other (comment)(mild SOB from pt, HR, spO2 status) PT Visit Diagnosis: Difficulty in walking, not elsewhere classified (R26.2);Muscle weakness (generalized) (M62.81);Adult, failure to thrive (R62.7)    Time: 5638-7564 PT Time Calculation (min) (ACUTE ONLY): 23 min   Charges:   PT Evaluation $PT Eval Moderate Complexity: 1 Mod PT Treatments $Therapeutic Activity: 8-22 mins        Lieutenant Diego PT, DPT 9:58 AM,05/29/18 [phone]

## 2018-05-29 NOTE — ED Notes (Signed)
Patient appropriate and cooperative in room reading a book. Patient has received 4 soft back books from her children "Love inspired suspense" she has one in her room and the others are at the nurses station.

## 2018-05-30 ENCOUNTER — Observation Stay: Payer: Medicare Other

## 2018-05-30 ENCOUNTER — Other Ambulatory Visit: Payer: Self-pay

## 2018-05-30 DIAGNOSIS — R6251 Failure to thrive (child): Secondary | ICD-10-CM | POA: Diagnosis present

## 2018-05-30 LAB — URINE CULTURE

## 2018-05-30 LAB — CBC
HCT: 43.8 % (ref 36.0–46.0)
Hemoglobin: 13.5 g/dL (ref 12.0–15.0)
MCH: 26.6 pg (ref 26.0–34.0)
MCHC: 30.8 g/dL (ref 30.0–36.0)
MCV: 86.2 fL (ref 80.0–100.0)
PLATELETS: 191 10*3/uL (ref 150–400)
RBC: 5.08 MIL/uL (ref 3.87–5.11)
RDW: 12.8 % (ref 11.5–15.5)
WBC: 7.3 10*3/uL (ref 4.0–10.5)
nRBC: 0 % (ref 0.0–0.2)

## 2018-05-30 LAB — BASIC METABOLIC PANEL
Anion gap: 10 (ref 5–15)
BUN: 9 mg/dL (ref 8–23)
CO2: 31 mmol/L (ref 22–32)
Calcium: 9.1 mg/dL (ref 8.9–10.3)
Chloride: 101 mmol/L (ref 98–111)
Creatinine, Ser: 0.48 mg/dL (ref 0.44–1.00)
GFR calc Af Amer: >60 mL/min (ref >60)
GFR calc non Af Amer: >60 mL/min (ref >60)
GLUCOSE: 145 mg/dL — AB (ref 70–99)
Potassium: 2.8 mmol/L — ABNORMAL LOW (ref 3.5–5.1)
Sodium: 142 mmol/L (ref 135–145)

## 2018-05-30 LAB — GLUCOSE, CAPILLARY: Glucose-Capillary: 138 mg/dL — ABNORMAL HIGH (ref 70–99)

## 2018-05-30 MED ORDER — HYDRALAZINE HCL 20 MG/ML IJ SOLN
10.0000 mg | INTRAMUSCULAR | Status: DC | PRN
  Administered 2018-05-30: 10 mg via INTRAVENOUS
  Filled 2018-05-30: qty 1

## 2018-05-30 MED ORDER — INSULIN ASPART 100 UNIT/ML ~~LOC~~ SOLN: 0.0000 [IU] | Freq: Three times a day (TID) | SUBCUTANEOUS | Status: DC

## 2018-05-30 MED ORDER — ACETAMINOPHEN 500 MG PO TABS: 500.0000 mg | ORAL_TABLET | ORAL | Status: DC | PRN

## 2018-05-30 MED ORDER — ALBUTEROL SULFATE (2.5 MG/3ML) 0.083% IN NEBU: 2.5000 mg | INHALATION_SOLUTION | RESPIRATORY_TRACT | Status: DC | PRN

## 2018-05-30 MED ORDER — ENOXAPARIN SODIUM 40 MG/0.4ML ~~LOC~~ SOLN
40.0000 mg | SUBCUTANEOUS | Status: DC
  Administered 2018-05-30 – 2018-05-31 (×2): 40 mg via SUBCUTANEOUS
  Filled 2018-05-30 (×2): qty 0.4

## 2018-05-30 MED ORDER — MEGESTROL ACETATE 20 MG PO TABS
40.0000 mg | ORAL_TABLET | Freq: Every day | ORAL | Status: DC
  Administered 2018-05-31 – 2018-06-01 (×2): 40 mg via ORAL
  Filled 2018-05-30 (×3): qty 2

## 2018-05-30 MED ORDER — ONDANSETRON 4 MG PO TBDP
4.0000 mg | ORAL_TABLET | Freq: Once | ORAL | Status: AC
  Administered 2018-05-30: 4 mg via ORAL
  Filled 2018-05-30: qty 1

## 2018-05-30 MED ORDER — HYDROCODONE-ACETAMINOPHEN 5-325 MG PO TABS: 1.0000 | ORAL_TABLET | Freq: Four times a day (QID) | ORAL | Status: DC | PRN

## 2018-05-30 MED ORDER — ONDANSETRON HCL 4 MG/2ML IJ SOLN: 4.0000 mg | Freq: Four times a day (QID) | INTRAMUSCULAR | Status: DC | PRN

## 2018-05-30 MED ORDER — INFLUENZA VAC SPLIT HIGH-DOSE 0.5 ML IM SUSY
0.5000 mL | PREFILLED_SYRINGE | INTRAMUSCULAR | Status: AC
  Administered 2018-06-01: 12:00:00 0.5 mL via INTRAMUSCULAR
  Filled 2018-05-30: qty 0.5

## 2018-05-30 MED ORDER — ENSURE ENLIVE PO LIQD
237.0000 mL | Freq: Two times a day (BID) | ORAL | Status: DC
  Administered 2018-06-01: 12:00:00 237 mL via ORAL

## 2018-05-30 MED ORDER — INSULIN ASPART 100 UNIT/ML ~~LOC~~ SOLN: 0.0000 [IU] | Freq: Every day | SUBCUTANEOUS | Status: DC

## 2018-05-30 MED ORDER — SODIUM CHLORIDE 0.9 % IV SOLN
INTRAVENOUS | Status: DC
  Administered 2018-05-30 – 2018-05-31 (×2): via INTRAVENOUS

## 2018-05-30 MED ORDER — ONDANSETRON HCL 4 MG PO TABS: 4.0000 mg | ORAL_TABLET | Freq: Four times a day (QID) | ORAL | Status: DC | PRN

## 2018-05-30 MED ORDER — SENNOSIDES-DOCUSATE SODIUM 8.6-50 MG PO TABS: 1.0000 | ORAL_TABLET | Freq: Every evening | ORAL | Status: DC | PRN

## 2018-05-30 NOTE — ED Provider Notes (Addendum)
-----------------------------------------   4:47 AM on 05/30/2018 -----------------------------------------   Blood pressure (!) 194/88, pulse 75, temperature 98.1 F (36.7 C), temperature source Oral, resp. rate 15, weight 55.2 kg, SpO2 91 %.  The patient is calm and cooperative at this time.  There have been no acute events since the last update.  Seen in person by psychiatry (Dr. Leverne Humbles) who revoked the IVC.  Social work has been consulted to help with placement.   Hinda Kehr, MD 05/30/18 [phone]

## 2018-05-30 NOTE — ED Notes (Signed)
Pt is calm, appropriate and aware she is in the hospital to find a safe place to stay. She has no SI/HI and gives no threats or indication she will leave. Pt is resting and reading her book and listening to music.

## 2018-05-30 NOTE — H&P (Addendum)
[facility] NAME: Lindsey English    MR#:  [id number]  DATE OF BIRTH:  03/17/1930  DATE OF ADMISSION:  05/29/2018  PRIMARY CARE PHYSICIAN: Katheren Shams   REQUESTING/REFERRING PHYSICIAN:   CHIEF COMPLAINT:   Chief Complaint  Patient presents with  . Hypertension    HISTORY OF PRESENT ILLNESS: Lindsey English  is a 85 y.o. female with a known history of diabetes mellitus type 2, hyperlipidemia, hypertension, thyroid disorder, DVT in the past, bronchial asthma currently in the emergency room.  She has been in the emergency room for last 44 hours.  She has very poor oral intake.  Patient just ate 8 grapes today.  She was initially involuntarily committed later on seen by psychiatry and IVC commitment was rescinded.  Has poor oral intake, feels weak and tired.  Her blood pressure has been elevated.  Has mild abdominal discomfort.  No diarrhea or vomiting.  Patient was brought to the emergency room by family for worsening dementia and behavioral disturbance and elevated blood pressure.  Dementia has been worsening for the last several months.  Even though patient has caregivers she has been found wandering a mile away from the house.  Couple of times she even left the stove on at home.  Has some paranoid delusions to.  Not taking any of her medications.  Has a skin tear over the left lower leg.  Seen by psychiatry service during her stay in the emergency room.  PAST MEDICAL HISTORY:   Past Medical History:  Diagnosis Date  . Asthma   . Cancer ([place])    Right  . Diabetes mellitus without complication ([place])   . DVT (deep venous thrombosis) ([place])   . Hiatal hernia   . Hyperlipemia   . Hypertension   . Hyperthyroidism     PAST SURGICAL HISTORY:  Past Surgical History:  Procedure Laterality Date  . ABDOMINAL HYSTERECTOMY    . BREAST SURGERY Right   . TOTAL HIP ARTHROPLASTY Left     SOCIAL HISTORY:  Social History   Tobacco  Use  . Smoking status: Former Research scientist (life sciences)  . Smokeless tobacco: Never Used  Substance Use Topics  . Alcohol use: No    FAMILY HISTORY:  Family History  Problem Relation Age of Onset  . Hypertension Mother     DRUG ALLERGIES:  Allergies  Allergen Reactions  . Shrimp [Shellfish Allergy]   . Sulfa Antibiotics Nausea Only  . Clindamycin Hcl Rash  . Keflex [Cephalexin] Rash  . 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 Authorizing Provider  acetaminophen (TYLENOL) 500 MG tablet Take 500 mg by mouth every 4 (four) hours as needed for mild pain.    Yes [provider]  albuterol (PROVENTIL HFA;VENTOLIN HFA) 108 (90 Base) MCG/ACT inhaler Inhale 2 puffs into the lungs every 4 (four) hours as needed for wheezing.   Yes [provider]  amLODipine (NORVASC) 5 MG tablet Take 5 mg by mouth daily.   Yes [provider]  aspirin EC 81 MG tablet Take 81 mg by mouth daily.   Yes [provider]  carvedilol (COREG) 12.5 MG tablet Take 12.5 mg by mouth 2 (two) times daily with a meal.  Yes [provider]  cloNIDine (CATAPRES) 0.1 MG tablet Take 0.1 mg by mouth 2 (two) times daily.   Yes [provider]  furosemide (LASIX) 40 MG tablet Take 40 mg by mouth daily.   Yes [provider]  potassium chloride SA (K-DUR,KLOR-CON) 20 MEQ tablet Take 40 mEq by mouth as directed.    Yes [provider]  simvastatin (ZOCOR) 10 MG tablet Take 10 mg by mouth at bedtime.   Yes [provider]  amoxicillin-clavulanate (AUGMENTIN) 875-125 MG tablet Take 1 tablet by mouth 2 (two) times daily. One po bid x 10 days 07/08/16   Drenda Freeze, MD  HYDROcodone-acetaminophen (NORCO/VICODIN) 5-325 MG  tablet Take 1 tablet by mouth every 6 (six) hours as needed for moderate pain. 07/08/16   Drenda Freeze, MD  oxyCODONE (OXY IR/ROXICODONE) 5 MG immediate release tablet Take 1 tablet (5 mg total) by mouth every 4 (four) hours as needed for moderate pain. 03/02/16   Hower, Aaron Mose, MD      PHYSICAL EXAMINATION:   VITAL SIGNS: Blood pressure (!) 204/68, pulse 62, temperature 97.7 F (36.5 C), temperature source Oral, resp. rate 16, weight 55.2 kg, SpO2 92 %.  GENERAL:  85 y.o.-year-old patient lying in the bed with no acute distress.  EYES: Pupils equal, round, reactive to light and accommodation. No scleral icterus. Extraocular muscles intact.  HEENT: Head atraumatic, normocephalic. Oropharynx dry and nasopharynx clear.  NECK:  Supple, no jugular venous distention. No thyroid enlargement, no tenderness.  LUNGS: Normal breath sounds bilaterally, no wheezing, rales,rhonchi or crepitation. No use of accessory muscles of respiration.  CARDIOVASCULAR: S1, S2 normal. No murmurs, rubs, or gallops.  ABDOMEN: Soft, nontender, nondistended. Bowel sounds present. No organomegaly or mass.  EXTREMITIES: No pedal edema, cyanosis, or clubbing.  NEUROLOGIC: Cranial nerves II through XII are intact. Muscle strength 5/5 in all extremities. Sensation intact. Gait not checked.  PSYCHIATRIC: The patient is alert and oriented x 3.  SKIN: Has a left leg wound  LABORATORY PANEL:   CBC Recent Labs  Lab 05/28/18 2117  WBC 5.7  HGB 11.7*  HCT 38.6  PLT 175  MCV 87.1  MCH 26.4  MCHC 30.3  RDW 12.7   ------------------------------------------------------------------------------------------------------------------  Chemistries  Recent Labs  Lab 05/28/18 2117  NA 141  K 3.5  CL 104  CO2 29  GLUCOSE 110*  BUN 8  CREATININE 0.50  CALCIUM 8.9   ------------------------------------------------------------------------------------------------------------------ CrCl cannot be calculated (Unknown  ideal weight.). ------------------------------------------------------------------------------------------------------------------ No results for input(s): TSH, T4TOTAL, T3FREE, THYROIDAB in the last 72 hours.  Invalid input(s): FREET3   Coagulation profile No results for input(s): INR, PROTIME in the last 168 hours. ------------------------------------------------------------------------------------------------------------------- No results for input(s): DDIMER in the last 72 hours. -------------------------------------------------------------------------------------------------------------------  Cardiac Enzymes Recent Labs  Lab 05/28/18 2117  TROPONINI <0.03   ------------------------------------------------------------------------------------------------------------------ Invalid input(s): POCBNP  ---------------------------------------------------------------------------------------------------------------  Urinalysis    Component Value Date/Time   COLORURINE STRAW (A) 05/29/2018 0508   APPEARANCEUR CLEAR (A) 05/29/2018 0508   APPEARANCEUR Clear 06/11/2014 1553   LABSPEC 1.003 (L) 05/29/2018 0508   LABSPEC 1.015 06/11/2014 1553   PHURINE 7.0 05/29/2018 0508   GLUCOSEU NEGATIVE 05/29/2018 0508   GLUCOSEU Negative 06/11/2014 1553   HGBUR NEGATIVE 05/29/2018 0508   BILIRUBINUR NEGATIVE 05/29/2018 0508   BILIRUBINUR Negative 06/11/2014 1553   KETONESUR 5 (A) 05/29/2018 0508   PROTEINUR NEGATIVE 05/29/2018 0508   NITRITE NEGATIVE 05/29/2018 0508   LEUKOCYTESUR TRACE (A) 05/29/2018 0508   LEUKOCYTESUR Negative  06/11/2014 1553     RADIOLOGY: Dg Chest 2 View  Result Date: 05/29/2018 CLINICAL DATA:  Elevated blood pressure. EXAM: CHEST - 2 VIEW COMPARISON:  AP view 02/29/2016 FINDINGS: Chronic cardiomegaly with slight progression from prior. Aortic atherosclerosis and tortuosity. Vascular congestion without overt edema. No focal airspace disease, pleural effusion or  pneumothorax. No acute osseous abnormalities. IMPRESSION: 1. Slight increased cardiomegaly from 2017.  Vascular congestion. 2.  Aortic Atherosclerosis (ICD10-I70.0). Electronically Signed   By: Keith Rake M.D.   On: 05/29/2018 02:02   Ct Head Wo Contrast  Result Date: 05/29/2018 CLINICAL DATA:  Headache intermittently for several weeks. Dementia. EXAM: CT HEAD WITHOUT CONTRAST TECHNIQUE: Contiguous axial images were obtained from the base of the skull through the vertex without intravenous contrast. COMPARISON:  12/26/2013 FINDINGS: BRAIN: There is sulcal and ventricular prominence consistent with superficial and central atrophy. No intraparenchymal hemorrhage, mass effect nor midline shift. Periventricular and subcortical white matter hypodensities consistent with chronic moderate small vessel ischemic disease are redemonstrated. No acute large vascular territory infarcts. No abnormal extra-axial fluid collections. Basal cisterns are not effaced and midline. VASCULAR: Moderate calcific atherosclerosis of the carotid siphons. SKULL: No skull fracture. No significant scalp soft tissue swelling. Osteoarthritis of the left TMJ with joint space narrowing and spurring. SINUSES/ORBITS: The mastoid air-cells are clear. The included paranasal sinuses are well-aerated.The included ocular globes and orbital contents are non-suspicious. OTHER: None. IMPRESSION: Atrophy with chronic appearing small vessel ischemic disease. No acute intracranial abnormality. Electronically Signed   By: Ashley Royalty M.D.   On: 05/29/2018 00:36    EKG: Orders placed or performed during the hospital encounter of 05/29/18  . ED EKG  . ED EKG  . EKG 12-Lead  . EKG 12-Lead    IMPRESSION AND PLAN:  85 year old elderly female patient with history of type 2 diabetes mellitus, hypertension, hyperlipidemia, thyroid disorder, dementia was  brought to the ER by family members for worsening dementia, confusion and elevated blood  pressure.  -Uncontrolled hypertension Resume clonidine, Coreg and amlodipine for control of blood pressure PRN IV hydralazine for better control of blood pressure Admit patient under observation bed  -Adult failure to thrive IV fluids Megace for appetite stimulation  -Dehydration IV fluid hydration  -Type 2 diabetes mellitus Diabetic diet with sliding scale coverage with insulin  -Advanced dementia Supportive care  -DVT prophylaxis subcu Lovenox daily  -Abdominal pain Check abdominal x-ray to assess for ileus  All the records are reviewed and case discussed with ED provider. Management plans discussed with the patient, family and they are in agreement.  CODE STATUS:Full code Code Status History    Date Active Date Inactive Code Status Order ID Comments User Context   02/29/2016 1107 03/02/2016 1707 Full Code [id number]  Dustin Flock, MD ED       TOTAL TIME TAKING CARE OF THIS PATIENT: 50 minutes.    Saundra Shelling M.D on 05/30/2018 at 5:43 PM  Between 7am to 6pm - Pager - [phone]  After 6pm go to [email or web address] - password [email or web address] [facility]  [place] [facility]  Office  [phone]  CC: Primary care physician; Katheren Shams

## 2018-05-30 NOTE — ED Notes (Signed)
Nurse in to evaluate pt. Pt was given emesis bag due to her gagging after taking a sip of water. Pt has family at bedside. Nurse spoke to Dr. Burlene Arnt about pt's condition. BP is remaining high even after medication. Pt is still c/o nausea and now abdominal pain. Nurse checked pt's BG

## 2018-05-30 NOTE — ED Notes (Signed)
Pt sleeping at this time. Nurse tried to have pt take a sip of water and she became upset asking nurse to leave her alone. Nurse will check on pt soon.

## 2018-05-30 NOTE — ED Notes (Signed)
Pt was resting with eyes closed. Pt has not eaten any breakfast at this time. Nurse encouraged pt to eat something but she says she is nauseated. Nurse stating she would speak with the doctor about something for nausea. Nurse told pt she would be back in to give her some food.

## 2018-05-30 NOTE — ED Notes (Signed)
Nurse spoke with Candace, SW about pt's status. Candace stating we are waiting on insurance authorization for facility placement.

## 2018-05-30 NOTE — ED Notes (Signed)
Wound care nurse called. Nurse explained pt had wounds on her ankles that the doctor wanted them to consult for.

## 2018-05-30 NOTE — ED Notes (Signed)
Pt encouraging pt to eat. Pt has eaten part of her grapes but not anything else. Nurse stating she would be back in a half hour to have her eat a couple more bites.

## 2018-05-30 NOTE — ED Notes (Signed)
Pt's pastor is at pt's bedside. Pt in NAD and denying needs.

## 2018-05-30 NOTE — Consult Note (Addendum)
WOC consult requested for right leg wound.  This was performed yesterday; please refer to previous progress notes for assessment and measurements, and topical treatment orders have been provided for staff nurses to perform. Please re-consult if further assistance is needed.  Thank-you,  Julien Girt MSN, [place], [place], [place], [place]

## 2018-05-30 NOTE — Clinical Social Work Note (Signed)
CSW still awaiting Insurance authorization for placement for patient. CSW will continue to follow for discharge planning.   [place], [place]

## 2018-05-30 NOTE — ED Provider Notes (Signed)
-----------------------------------------   5:44 PM on 05/30/2018 -----------------------------------------  Still does not have excellent control of her blood pressure here, I did consult the hospitalist for help with this as this chronic issue is not something that we are usually very well equipped to handle in the emergency department.  Patient is also refusing food I suspect some degree of depression is to blame.  Overall however I think she is at this time transcending her ability to safely take care of her in the emergency room.  The hospitalist service will admit her for an observational status to hydrate her, continue to manage her blood pressures, she is asymptomatic with this and likely this is chronic but again, difficult for [place] to know,   Schuyler Amor, MD 05/30/18 1745

## 2018-05-30 NOTE — ED Notes (Signed)
Pt up to the toilet with the use of her walker. Gait steady and pt appropriate. Doesn't want help from staff just needed directions to open lid and how to flush. Pt back to bed with minimal assistance and went to sleep after taking without any difficulty

## 2018-05-30 NOTE — ED Notes (Signed)
Nurse called to give report and nurse was unable to take report at this time.

## 2018-05-31 DIAGNOSIS — F22 Delusional disorders: Secondary | ICD-10-CM | POA: Diagnosis not present

## 2018-05-31 DIAGNOSIS — F4321 Adjustment disorder with depressed mood: Secondary | ICD-10-CM | POA: Diagnosis not present

## 2018-05-31 DIAGNOSIS — F0391 Unspecified dementia with behavioral disturbance: Secondary | ICD-10-CM | POA: Diagnosis not present

## 2018-05-31 LAB — GLUCOSE, CAPILLARY
GLUCOSE-CAPILLARY: 136 mg/dL — AB (ref 70–99)
Glucose-Capillary: 81 mg/dL (ref 70–99)
Glucose-Capillary: 101 mg/dL — ABNORMAL HIGH (ref 70–99)
Glucose-Capillary: 101 mg/dL — ABNORMAL HIGH (ref 70–99)
Glucose-Capillary: 128 mg/dL — ABNORMAL HIGH (ref 70–99)

## 2018-05-31 LAB — POTASSIUM: Potassium: 3.9 mmol/L (ref 3.5–5.1)

## 2018-05-31 MED ORDER — POTASSIUM CHLORIDE 10 MEQ/100ML IV SOLN
10.0000 meq | INTRAVENOUS | Status: AC
  Administered 2018-05-31 (×4): 10 meq via INTRAVENOUS
  Filled 2018-05-31 (×4): qty 100

## 2018-05-31 MED ORDER — ADULT MULTIVITAMIN W/MINERALS CH
1.0000 | ORAL_TABLET | Freq: Every day | ORAL | Status: DC
  Administered 2018-06-01: 12:00:00 1 via ORAL
  Filled 2018-05-31: qty 1

## 2018-05-31 MED ORDER — OCUVITE-LUTEIN PO CAPS
1.0000 | ORAL_CAPSULE | Freq: Every day | ORAL | Status: DC
  Administered 2018-05-31 – 2018-06-01 (×2): 1 via ORAL
  Filled 2018-05-31 (×2): qty 1

## 2018-05-31 MED ORDER — AMLODIPINE BESYLATE 10 MG PO TABS
10.0000 mg | ORAL_TABLET | Freq: Every day | ORAL | Status: DC
  Administered 2018-06-01: 10 mg via ORAL
  Filled 2018-05-31: qty 1

## 2018-05-31 NOTE — Progress Notes (Signed)
[facility] [place] NAME: Lindsey English    MR#:  [id number]  DATE OF BIRTH:  [birth year]  SUBJECTIVE: Patient admitted for uncontrolled hypertension, found to have hypokalemia.  Patient is waiting in the emergency room for 44 hours for placement, then decided to admit the patient to medicine because of uncontrolled hypertension.  CHIEF COMPLAINT:   Chief Complaint  Patient presents with  . Hypertension    REVIEW OF SYSTEMS:   Confused at baseline.  DRUG ALLERGIES:   Allergies  Allergen Reactions  . Shrimp [Shellfish Allergy]   . Sulfa Antibiotics Nausea Only  . Clindamycin Hcl Rash  . Keflex [Cephalexin] Rash  . 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 rate 18, height 5\' 2"  (1.575 m), weight 52.2 kg, SpO2 97 %.  PHYSICAL EXAMINATION:  GENERAL:  85 y.o.-year-old patient lying in the bed with no acute distress.  EYES: Pupils equal, round, reactive to light and accommodation. No scleral icterus. Extraocular muscles intact.  HEENT: Head atraumatic, normocephalic. Oropharynx and nasopharynx clear.  NECK:  Supple, no jugular venous distention. No thyroid enlargement, no tenderness.  LUNGS: Normal breath sounds bilaterally, no wheezing, rales,rhonchi or crepitation. No use of accessory muscles of respiration.  CARDIOVASCULAR: S1, S2 normal. No murmurs, rubs, or gallops.  ABDOMEN: Soft, nontender, nondistended. Bowel sounds present. No organomegaly or mass.  EXTREMITIES: No pedal edema, cyanosis, or clubbing.  NEUROLOGIC: Cranial nerves II through XII are intact. Muscle strength 5/5 in all extremities. Sensation intact. Gait not checked.  PSYCHIATRIC: Confused but able to follow some commands SKIN: No obvious rash, lesion, or ulcer.    LABORATORY  PANEL:   CBC Recent Labs  Lab 05/30/18 2054  WBC 7.3  HGB 13.5  HCT 43.8  PLT 191   ------------------------------------------------------------------------------------------------------------------  Chemistries  Recent Labs  Lab 05/30/18 2054  NA 142  K 2.8*  CL 101  CO2 31  GLUCOSE 145*  BUN 9  CREATININE 0.48  CALCIUM 9.1   ------------------------------------------------------------------------------------------------------------------  Cardiac Enzymes Recent Labs  Lab 05/28/18 2117  TROPONINI <0.03   ------------------------------------------------------------------------------------------------------------------  RADIOLOGY:  Dg Abd 1 View  Result Date: 05/30/2018 CLINICAL DATA:  Abdominal pain EXAM: ABDOMEN - 1 VIEW COMPARISON:  None. FINDINGS: Lung bases are clear. Nonobstructed gas pattern with mild to moderate stool. Phleboliths in the right pelvis. Status post left hip replacement. Marked arthritis of the right hip. IMPRESSION: Nonobstructed bowel-gas pattern Electronically Signed   By: Donavan Foil M.D.   On: 05/30/2018 18:42    EKG:   Orders placed or performed during the hospital encounter of 05/29/18  . ED EKG  . ED EKG  . EKG 12-Lead  . EKG 12-Lead    ASSESSMENT AND PLAN:   85 year old female patient with advanced dementia, diabetes mellitus type 2, hypertension, hyperlipidemia brought in by family for worsening dementia, confusion, initially made IVC but psychiatry rescinded IVC, patient found to have uncontrolled hypertension so admitted to medicine. 1.  Uncontrolled hypertension, patient is on clonidine, Coreg, BP is still high, increase the dose of amlodipine to 10 mg, if needed hydralazine can be added at 25 mg p.o. 3 times daily for the blood pressure . 2.  Severe hypokalemia likely due to poor p.o. intake, replace potassium.  3.  Advanced Alzheimer's dementia, patient physical therapy,  wanted SNF placement, [facility].  Most likely she can go tomorrow as we stabilize the blood pressure, replace potassium today.     All the records are reviewed and case discussed with Care Management/Social Workerr. Management plans discussed with the patient, family and they are in agreement.  CODE STATUS: Full code  TOTAL TIME TAKING CARE OF THIS PATIENT: 38 minutes.   POSSIBLE D/C IN 1-2DAYS, DEPENDING ON CLINICAL CONDITION.  Than 50% time spent in counseling, coordination of care. Epifanio Lesches M.D on 05/31/2018 at 11:14 AM  Between 7am to 6pm - Pager - [phone]  After 6pm go to [email or web address] - password [email or web address] [place] [facility]  Office  [phone]  CC: Primary care physician; Katheren Shams   Note: This dictation was prepared with Dragon dictation along with smaller phrase technology. Any transcriptional errors that result from this process are unintentional.
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
## 2018-05-31 NOTE — Care Management Note (Signed)
Case Management Note  Patient Details  Name: Lindsey English MRN: [id number] Date of Birth: 1929/07/13  Subjective/Objective:      Admitted to [facility] under observation status with the diagnosis of dementia. Son is [name] [phone]). Last seen Dr. Netty Starring n Edwina Barth 07/26/17. [facility] [place] in the past. [facility] in the past.    Potassium 2.9 today. Spoke with Dr. Vianne Bulls. Will give supplemental potassium and re-check potassium levels. Possible discharge tomorrow.            Action/Plan: Received referral for Home Health needs.  Authorization per [facility] obtained to go to [facility].   Expected Discharge Date:                  Expected Discharge Plan:     In-House Referral:   yes  Discharge planning Services   yes  Post Acute Care Choice:    Choice offered to:     DME Arranged:    DME Agency:     HH Arranged:    HH Agency:     Status of Service:     If discussed at [employer] of Stay Meetings, dates discussed:    Additional Comments:  Shelbie Ammons, RN MSN CCM Care Management [phone] 05/31/2018, 10:23 AM

## 2018-05-31 NOTE — Care Management Obs Status (Signed)
[place] NOTIFICATION   Patient Details  Name: AIRYONNA FRANKLYN MRN: [id number] Date of Birth: 15-Dec-1929   Medicare Observation Status Notification Given:  Yes; Explained to family    Shelbie Ammons, RN 05/31/2018, 8:52 AM

## 2018-05-31 NOTE — Progress Notes (Signed)
Physical Therapy Treatment Patient Details Name: Lindsey English MRN: [id number] DOB: 12-06-1929 Today's Date: 05/31/2018    History of Present Illness Pt is 85 yo female brought to ED by sons for worsening dementia with behavioral disturbances, medication non-compliance, lower leg wound.    PT Comments    Pt agreeable to PT, in bed with complaints of a headache. BP assessed in supine, sitting, and standing, nursing notified of elevated BP. Pt also on room air at start of session, 87% spO2, with RN consent pt placed on 2L of O2 for mobility, >92% on 2L throughout. Pt unable to state birth year this session, more tangential conversation and perseveration noted, more verbal cues to attend to task needed. Pt mobilized to EOB with supervision and extended time/use of bed rails. Sit<>stand with CGA and RW, poor initial standing balance noted, pt using bed to stabilize posteriorly. Pt able to ambulate with RW and CGA, quickly became fatigued, exhibited some shakiness, unsteadiness, and decreased gait velocity and quality compared to previous session. Pt up in chair with all needs in reach. Overall the pt demonstrated an acute decline in functional mobility, discharge plans updated to STR for pt safety.     Follow Up Recommendations  SNF     Equipment Recommendations  Rolling walker with 5" wheels    Recommendations for Other Services       Precautions / Restrictions Precautions Precautions: Fall Restrictions Weight Bearing Restrictions: No    Mobility  Bed Mobility Overal bed mobility: Needs Assistance Bed Mobility: Supine to Sit     Supine to sit: Supervision     General bed mobility comments: Pt complained of weakness, decreased quality/speed of movement this session  Transfers Overall transfer level: Needs assistance Equipment used: Rolling walker (2 wheeled) Transfers: Sit to/from Stand Sit to Stand: Min guard         General transfer comment: Pt uses bed to stabilize LE  with posterior lean   Ambulation/Gait Ambulation/Gait assistance: Min guard Gait Distance (Feet): 20 Feet Assistive device: Rolling walker (2 wheeled) Gait Pattern/deviations: Trunk flexed;Shuffle     General Gait Details: Pt with complaints of fatigue, some shakiness noted. Decreased velocity from last session, vitals stable.   Stairs             Wheelchair Mobility    Modified Rankin (Stroke Patients Only)       Balance Overall balance assessment: Needs assistance Sitting-balance support: Feet supported Sitting balance-Leahy Scale: Fair       Standing balance-Leahy Scale: Poor                              Cognition Arousal/Alertness: Awake/alert Behavior During Therapy: Restless;Anxious Overall Cognitive Status: No family/caregiver present to determine baseline cognitive functioning                                 General Comments: unable to provide birthday year. aware she is in a hospital, but disoriented. More perseveration and tangential conversation this session      Exercises      General Comments        Pertinent Vitals/Pain Pain Assessment: Faces Faces Pain Scale: Hurts a little bit Pain Location: headache Pain Descriptors / Indicators: Discomfort;Nagging Pain Intervention(s): Limited activity within patient's tolerance;Monitored during session;Repositioned    Home Living  Prior Function            PT Goals (current goals can now be found in the care plan section) Progress towards PT goals: Not progressing toward goals - comment(decrease in mobility this session, pt fatigued)    Frequency    Min 2X/week      PT Plan Discharge plan needs to be updated    Co-evaluation              AM-PAC PT "6 Clicks" Mobility   Outcome Measure  Help needed turning from your back to your side while in a flat bed without using bedrails?: None Help needed moving from lying on your  back to sitting on the side of a flat bed without using bedrails?: A Little Help needed moving to and from a bed to a chair (including a wheelchair)?: A Little Help needed standing up from a chair using your arms (e.g., wheelchair or bedside chair)?: A Little Help needed to walk in hospital room?: A Little Help needed climbing 3-5 steps with a railing? : Total 6 Click Score: 17    End of Session Equipment Utilized During Treatment: Gait belt Activity Tolerance: Patient limited by fatigue Patient left: with chair alarm set;in chair;with call bell/phone within reach Nurse Communication: Mobility status;Other (comment)(oxygen needs, change in PT plan) PT Visit Diagnosis: Difficulty in walking, not elsewhere classified (R26.2);Muscle weakness (generalized) (M62.81);Adult, failure to thrive (R62.7)     Time: 2202-5427 PT Time Calculation (min) (ACUTE ONLY): 40 min  Charges:  $Therapeutic Activity: 23-37 mins                     Lieutenant Diego PT, DPT 11:14 AM,05/31/18 [phone]

## 2018-05-31 NOTE — Progress Notes (Signed)
Initial Nutrition Assessment  DOCUMENTATION CODES:   Non-severe (moderate) malnutrition in context of social or environmental circumstances  INTERVENTION:   Ensure Enlive po BID, each supplement provides 350 kcal and 20 grams of protein  Magic cup TID with meals, each supplement provides 290 kcal and 9 grams of protein  MVI daily   Ocuvite daily for wound healing (provides zinc, vitamin A, vitamin C, Vitamin E, copper, and selenium)  Liberalize diet   NUTRITION DIAGNOSIS:   Moderate Malnutrition related to social / environmental circumstances(advanced age, dementia ) as evidenced by mild to moderate fat depletions, mild to moderate muscle depletions.  GOAL:   Patient will meet greater than or equal to 90% of their needs  MONITOR:   PO intake, Supplement acceptance, Labs, Weight trends, Skin, I & O's  REASON FOR ASSESSMENT:   Malnutrition Screening Tool    ASSESSMENT:   85 year old elderly female patient with history of type 2 diabetes mellitus, hypertension, hyperlipidemia, thyroid disorder, dementia was  brought to the ER by family members for worsening dementia, confusion and elevated blood pressure.   Met with pt and family in room today. Pt able to provide some history was able to tell me she ate collards, mashed potatoes and [place] that "taste like chicken" for lunch today. Pt reports eating about 50% of her meal tray. Pt reports that she does enjoy Ensure and she drinks these at home. Pt reports poor appetite and oral intake for several days pta r/t nausea and vomiting. Per chart, pt is weight stable pta. RD will liberalize diet and add supplements to help pt meet her estimated needs. Pt with a non-healing leg wound; will add ocuvite to support wound healing. Recommend continue Ocuvite after discharge.   Medications reviewed and include: aspirin, lovenox, insulin, megace, KCl  Labs reviewed: K 2.8(L)  NUTRITION - FOCUSED PHYSICAL EXAM:    Most Recent Value   Orbital Region  Mild depletion  Upper Arm Region  Moderate depletion  Thoracic and Lumbar Region  Mild depletion  Buccal Region  Mild depletion  Temple Region  Moderate depletion  Clavicle Bone Region  Moderate depletion  Clavicle and Acromion Bone Region  Moderate depletion  Scapular Bone Region  Mild depletion  Dorsal Hand  Moderate depletion  Patellar Region  Moderate depletion  Anterior Thigh Region  Moderate depletion  Posterior Calf Region  Moderate depletion  Edema (RD Assessment)  None  Hair  Reviewed  Eyes  Reviewed  Mouth  Reviewed  Skin  Reviewed  Nails  Reviewed     Diet Order:   Diet Order            Diet regular Room service appropriate? Yes; Fluid consistency: Thin  Diet effective now             EDUCATION NEEDS:   Education needs have been addressed(with pt's family )  Skin:  Skin Assessment: Reviewed RN Assessment(non healing wound R lower leg )  Last BM:  pta  Height:   Ht Readings from Last 1 Encounters:  05/30/18 5' 2" (1.575 m)    Weight:   Wt Readings from Last 1 Encounters:  05/30/18 52.2 kg    Ideal Body Weight:  50 kg  BMI:  Body mass index is 21.05 kg/m.  Estimated Nutritional Needs:   Kcal:  1200-1400kcal/day   Protein:  58-68g/day   Fluid:  >1.3L/day   Koleen Distance MS, RD, LDN Pager #- [phone] Office#- [phone] After Hours Pager: [phone]

## 2018-05-31 NOTE — Clinical Social Work Note (Signed)
CSW received phone call from [place] at [facility]. Tammy states that [facility] has authorized patient to go to [facility]. CSW notified MD.   Annamaria Boots MSW, [place]

## 2018-06-01 DIAGNOSIS — E440 Moderate protein-calorie malnutrition: Secondary | ICD-10-CM

## 2018-06-01 LAB — BASIC METABOLIC PANEL
Anion gap: 4 — ABNORMAL LOW (ref 5–15)
BUN: 15 mg/dL (ref 8–23)
CO2: 32 mmol/L (ref 22–32)
CREATININE: 0.46 mg/dL (ref 0.44–1.00)
Calcium: 8.6 mg/dL — ABNORMAL LOW (ref 8.9–10.3)
Chloride: 106 mmol/L (ref 98–111)
GFR calc Af Amer: >60 mL/min (ref >60)
GFR calc non Af Amer: >60 mL/min (ref >60)
Glucose, Bld: 92 mg/dL (ref 70–99)
Potassium: 3.5 mmol/L (ref 3.5–5.1)
Sodium: 142 mmol/L (ref 135–145)

## 2018-06-01 LAB — GLUCOSE, CAPILLARY
Glucose-Capillary: 82 mg/dL (ref 70–99)
Glucose-Capillary: 92 mg/dL (ref 70–99)
Glucose-Capillary: 122 mg/dL — ABNORMAL HIGH (ref 70–99)

## 2018-06-01 LAB — CBC
HCT: 38.2 % (ref 36.0–46.0)
Hemoglobin: 11.5 g/dL — ABNORMAL LOW (ref 12.0–15.0)
MCH: 26.4 pg (ref 26.0–34.0)
MCHC: 30.1 g/dL (ref 30.0–36.0)
MCV: 87.8 fL (ref 80.0–100.0)
Platelets: 174 10*3/uL (ref 150–400)
RBC: 4.35 MIL/uL (ref 3.87–5.11)
RDW: 12.6 % (ref 11.5–15.5)
WBC: 6.6 10*3/uL (ref 4.0–10.5)
nRBC: 0 % (ref 0.0–0.2)

## 2018-06-01 MED ORDER — LORAZEPAM 2 MG PO TABS
2.0000 mg | ORAL_TABLET | Freq: Once | ORAL | Status: AC
  Administered 2018-06-01: 15:00:00 2 mg via ORAL
  Filled 2018-06-01: qty 1

## 2018-06-01 MED ORDER — CARVEDILOL 3.125 MG PO TABS: 3.1250 mg | ORAL_TABLET | Freq: Two times a day (BID) | ORAL | 11 refills | Status: AC

## 2018-06-01 MED ORDER — MEGESTROL ACETATE 40 MG PO TABS: 40.0000 mg | ORAL_TABLET | Freq: Every day | ORAL | 0 refills | Status: DC

## 2018-06-01 MED ORDER — OCUVITE-LUTEIN PO CAPS: 1.0000 | ORAL_CAPSULE | Freq: Every day | ORAL | 0 refills | Status: DC

## 2018-06-01 MED ORDER — HYDRALAZINE HCL 100 MG PO TABS: 100.0000 mg | ORAL_TABLET | Freq: Three times a day (TID) | ORAL | 11 refills | Status: DC

## 2018-06-01 MED ORDER — ENSURE ENLIVE PO LIQD: 237.0000 mL | Freq: Two times a day (BID) | ORAL | 12 refills | Status: DC

## 2018-06-01 MED ORDER — TIOTROPIUM BROMIDE MONOHYDRATE 18 MCG IN CAPS: 18.0000 ug | ORAL_CAPSULE | Freq: Every day | RESPIRATORY_TRACT | 0 refills | Status: DC

## 2018-06-01 MED ORDER — AMLODIPINE BESYLATE 10 MG PO TABS: 10.0000 mg | ORAL_TABLET | Freq: Every day | ORAL | 0 refills | Status: AC

## 2018-06-01 MED ORDER — ADULT MULTIVITAMIN W/MINERALS CH: 1.0000 | ORAL_TABLET | Freq: Every day | ORAL | 0 refills | Status: AC

## 2018-06-01 NOTE — Progress Notes (Signed)
Pt to discharge to [facility] with ems as transport. 02 2l [place].  No resp distress. Ensure given.  tol meds well.  Family at bedside.  Sl d/cd. Ems called and report called to nicole at [facility].

## 2018-06-01 NOTE — Progress Notes (Signed)
[facility] here to [facility] to [facility].

## 2018-06-01 NOTE — Clinical Social Work Note (Signed)
Patient is medically ready for discharge today. CSW notified patient's son Tiffnay Bossi [phone] of discharge to [facility] today. Son is in agreement with this plan. Son would like patient transported by EMS. CSW also notified Otila Kluver at [employer] of discharge today. Otila Kluver states that they have [facility] authorization and patient will go to room 801. RN will call report and call for transport.   [place], [place]

## 2018-06-01 NOTE — Plan of Care (Signed)
  Problem: Education: Goal: Knowledge of General Education information will improve Description Including pain rating scale, medication(s)/side effects and non-pharmacologic comfort measures Outcome: Progressing   Problem: Health Behavior/Discharge Planning: Goal: Ability to manage health-related needs will improve Outcome: Progressing   Problem: Clinical Measurements: Goal: Ability to maintain clinical measurements within normal limits will improve Outcome: Progressing Goal: Diagnostic test results will improve Outcome: Progressing   Problem: Nutrition: Goal: Adequate nutrition will be maintained Outcome: Progressing   Problem: Coping: Goal: Level of anxiety will decrease Outcome: Progressing   Problem: Safety: Goal: Ability to remain free from injury will improve Outcome: Progressing   Problem: Skin Integrity: Goal: Risk for impaired skin integrity will decrease Outcome: Progressing

## 2018-06-01 NOTE — Discharge Summary (Signed)
Lindsey English, is a 85 y.o. female  DOB 1929/06/25  MRN [id number].  Admission date:  05/29/2018  Admitting Physician  Saundra Shelling, MD  Discharge Date:  06/01/2018   Primary MD  Katheren Shams  Recommendations for primary care physician for things to follow:   Follow with PCP in 1 week   Admission Diagnosis  Ileus ([place]) [K56.7] Essential hypertension [I10] Dementia with behavioral disturbance, unspecified dementia type ([place]) [F03.91]   Discharge Diagnosis  Ileus ([place]) [K56.7] Essential hypertension [I10] Dementia with behavioral disturbance, unspecified dementia type ([place]) [F03.91]    Principal Problem:   Dementia ([place]) Active Problems:   Lymphedema   Failure to thrive (0-17)   Malnutrition of moderate degree      Past Medical History:  Diagnosis Date  . Asthma   . Cancer ([place])    Right  . Diabetes mellitus without complication ([place])   . DVT (deep venous thrombosis) ([place])   . Hiatal hernia   . Hyperlipemia   . Hypertension   . Hyperthyroidism     Past Surgical History:  Procedure Laterality Date  . ABDOMINAL HYSTERECTOMY    . BREAST SURGERY Right   . TOTAL HIP ARTHROPLASTY Left        History of present illness and  Hospital Course:     Kindly see H&P for history of present illness and admission details, please review complete Labs, Consult reports and Test reports for all details in brief  HPI  from the history and physical done on the day of admission 85 year old female patient admitted for uncontrolled hypertension, hypokalemia.  Patient initially brought in by family as patient has advanced dementia with behavior problems and family is not able to care for her at home, made IVC initially then psychiatrist rescinded, patient found to have uncontrolled hypertension and admitted to medical  service.   Hospital Course   #1. accelerated hypertension: Patient blood pressure was very elevated on admission, found to have systolic blood pressure more than 200, received home dose Coreg, clonidine, increase the Norvasc to 10 mg daily, patient has lost her husband in January and she is very stressed and also has dementia.  Because of relative bradycardia I decreased the dose of Coreg, added hydralazine 100 mg 3 times daily to her blood pressure medicine, blood pressure is better this morning.  Patient stable to go to nursing home, [facility]  Patient has medication noncompliance at home. 2.  Severe hypokalemia secondary to diuretics, potassium replaced, discontinued the Lasix at discharge because of advanced age, prone for hypokalemia.  #3 advanced dementia with behavioral disturbances, seen by psychiatry, patient has been mentioning about her husband's death and also by her grandson diagnosed with some cancer.  Psychiatrist did not feel patient needs IVC ER admission to inpatient psych #4 COPD: No wheezing.  Continue home dose albuterol inhaler.  Continue Spiriva.. #5 moderate malnutrition, seen by dietitian, started on Ensure. Discharge Condition:    Follow UP   Contact information for follow-up providers    [facility]. Schedule an appointment as soon as possible for a visit in 1 week(s).   Contact information: [place] [phone]            Contact information for after-discharge care    Destination    [facility] Preferred SNF .   Service:  Skilled Nursing Contact information: [address] [place] [phone]  Discharge Instructions  and  Discharge Medications     Allergies as of 06/01/2018      Reactions   Shrimp [shellfish Allergy]    Sulfa Antibiotics Nausea Only   Clindamycin Hcl Rash   Keflex [cephalexin] Rash    Vancomycin Other (See Comments)   Red man's syndrome- Prolong infusion Red man's syndrome- Prolong infusion      Medication List    STOP taking these medications   acetaminophen 500 MG tablet Commonly known as:  TYLENOL   amoxicillin-clavulanate 875-125 MG tablet Commonly known as:  AUGMENTIN   furosemide 40 MG tablet Commonly known as:  LASIX   HYDROcodone-acetaminophen 5-325 MG tablet Commonly known as:  NORCO/VICODIN   oxyCODONE 5 MG immediate release tablet Commonly known as:  Oxy IR/ROXICODONE   potassium chloride SA 20 MEQ tablet Commonly known as:  K-DUR,KLOR-CON     TAKE these medications   albuterol 108 (90 Base) MCG/ACT inhaler Commonly known as:  PROVENTIL HFA;VENTOLIN HFA Inhale 2 puffs into the lungs every 4 (four) hours as needed for wheezing.   amLODipine 10 MG tablet Commonly known as:  NORVASC Take 1 tablet (10 mg total) by mouth daily. What changed:    medication strength  how much to take   aspirin EC 81 MG tablet Take 81 mg by mouth daily.   carvedilol 3.125 MG tablet Commonly known as:  COREG Take 1 tablet (3.125 mg total) by mouth 2 (two) times daily. What changed:    medication strength  how much to take  when to take this   cloNIDine 0.1 MG tablet Commonly known as:  CATAPRES Take 0.1 mg by mouth 2 (two) times daily.   feeding supplement (ENSURE ENLIVE) Liqd Take 237 mLs by mouth 2 (two) times daily between meals.   hydrALAZINE 100 MG tablet Commonly known as:  APRESOLINE Take 1 tablet (100 mg total) by mouth 3 (three) times daily.   megestrol 40 MG tablet Commonly known as:  MEGACE Take 1 tablet (40 mg total) by mouth daily.   multivitamin with minerals Tabs tablet Take 1 tablet by mouth daily.   multivitamin-lutein Caps capsule Take 1 capsule by mouth daily.   simvastatin 10 MG tablet Commonly known as:  ZOCOR Take 10 mg by mouth at bedtime.   tiotropium 18 MCG inhalation capsule Commonly known as:  SPIRIVA  HANDIHALER Place 1 capsule (18 mcg total) into inhaler and inhale daily.         Diet and Activity recommendation: See Discharge Instructions above   Consults obtained -psychiatry, physical therapy   Major procedures and Radiology Reports - PLEASE review detailed and final reports for all details, in brief -      Dg Chest 2 View  Result Date: 05/29/2018 CLINICAL DATA:  Elevated blood pressure. EXAM: CHEST - 2 VIEW COMPARISON:  AP view 02/29/2016 FINDINGS: Chronic cardiomegaly with slight progression from prior. Aortic atherosclerosis and tortuosity. Vascular congestion without overt edema. No focal airspace disease, pleural effusion or pneumothorax. No acute osseous abnormalities. IMPRESSION: 1. Slight increased cardiomegaly from 2017.  Vascular congestion. 2.  Aortic Atherosclerosis (ICD10-I70.0). Electronically Signed   By: Keith Rake M.D.   On: 05/29/2018 02:02   Dg Abd 1 View  Result Date: 05/30/2018 CLINICAL DATA:  Abdominal pain EXAM: ABDOMEN - 1 VIEW COMPARISON:  None. FINDINGS: Lung bases are clear. Nonobstructed gas pattern with mild to moderate stool. Phleboliths in the right pelvis. Status post left hip replacement. Marked arthritis of the right hip. IMPRESSION: Nonobstructed  bowel-gas pattern Electronically Signed   By: Donavan Foil M.D.   On: 05/30/2018 18:42   Ct Head Wo Contrast  Result Date: 05/29/2018 CLINICAL DATA:  Headache intermittently for several weeks. Dementia. EXAM: CT HEAD WITHOUT CONTRAST TECHNIQUE: Contiguous axial images were obtained from the base of the skull through the vertex without intravenous contrast. COMPARISON:  12/26/2013 FINDINGS: BRAIN: There is sulcal and ventricular prominence consistent with superficial and central atrophy. No intraparenchymal hemorrhage, mass effect nor midline shift. Periventricular and subcortical white matter hypodensities consistent with chronic moderate small vessel ischemic disease are redemonstrated. No acute  large vascular territory infarcts. No abnormal extra-axial fluid collections. Basal cisterns are not effaced and midline. VASCULAR: Moderate calcific atherosclerosis of the carotid siphons. SKULL: No skull fracture. No significant scalp soft tissue swelling. Osteoarthritis of the left TMJ with joint space narrowing and spurring. SINUSES/ORBITS: The mastoid air-cells are clear. The included paranasal sinuses are well-aerated.The included ocular globes and orbital contents are non-suspicious. OTHER: None. IMPRESSION: Atrophy with chronic appearing small vessel ischemic disease. No acute intracranial abnormality. Electronically Signed   By: Ashley Royalty M.D.   On: 05/29/2018 00:36    Micro Results     Recent Results (from the past 240 hour(s))  Urine culture     Status: Abnormal   Collection Time: 05/29/18  5:08 AM  Result Value Ref Range Status   Specimen Description   Final    URINE, RANDOM Performed at [facility], [address], [place], [place] [address]    Special Requests   Final    NONE Performed at [facility], [address] [place], [place] [address]    Culture MULTIPLE SPECIES PRESENT, SUGGEST RECOLLECTION (A)  Final   Report Status 05/30/2018 FINAL  Final       Today   Subjective:   Fay Records stable for discharge  Objective:   Blood pressure (!) 167/53, pulse (!) 53, temperature 98 F (36.7 C), temperature source Oral, resp. rate 20, height 5\' 2"  (1.575 m), weight 52.2 kg, SpO2 98 %.   Intake/Output Summary (Last 24 hours) at 06/01/2018 0849 Last data filed at 05/31/2018 1907 Gross per 24 hour  Intake 1608.92 ml  Output -  Net 1608.92 ml    Exam Awake, reading a book. [place].AT,PERRAL Supple Neck,No JVD, No cervical lymphadenopathy appriciated.  Symmetrical Chest wall movement, Good air movement bilaterally, CTAB RRR,No Gallops,Rubs or new Murmurs, No Parasternal Heave +ve B.Sounds, Abd Soft, Non tender, No organomegaly appriciated, No  rebound -guarding or rigidity. No Cyanosis, Clubbing or edema, No new Rash or bruise  Data Review   CBC w Diff:  Lab Results  Component Value Date   WBC 6.6 06/01/2018   HGB 11.5 (L) 06/01/2018   HGB 8.1 (L) 06/20/2014   HCT 38.2 06/01/2018   HCT 39.0 06/11/2014   PLT 174 06/01/2018   PLT 153 06/18/2014   LYMPHOPCT 22 09/18/2017   LYMPHOPCT 24.4 03/14/2014   MONOPCT 6 09/18/2017   MONOPCT 8.6 03/14/2014   EOSPCT 3 09/18/2017   EOSPCT 6.6 03/14/2014   BASOPCT 0 09/18/2017   BASOPCT 0.2 03/14/2014    CMP:  Lab Results  Component Value Date   NA 142 06/01/2018   NA 141 06/19/2014   K 3.5 06/01/2018   K 3.5 06/19/2014   CL 106 06/01/2018   CL 104 06/19/2014   CO2 32 06/01/2018   CO2 30 06/19/2014   BUN 15 06/01/2018   BUN 8 06/19/2014   CREATININE 0.46  06/01/2018   CREATININE 0.48 (L) 06/19/2014   PROT 7.9 09/18/2017   PROT 7.6 10/24/2012   ALBUMIN 4.3 09/18/2017   ALBUMIN 4.1 10/24/2012   BILITOT 0.6 09/18/2017   BILITOT 0.1 (L) 10/24/2012   ALKPHOS 59 09/18/2017   ALKPHOS 57 10/24/2012   AST 19 09/18/2017   AST 24 10/24/2012   ALT 13 (L) 09/18/2017   ALT 34 10/24/2012  .   Total Time in preparing paper work, data evaluation and todays exam - 35 minutes  Epifanio Lesches M.D on 06/01/2018 at 8:49 AM    Note: This dictation was prepared with Dragon dictation along with smaller phrase technology. Any transcriptional errors that result from this process are unintentional.

## 2018-06-01 NOTE — Consult Note (Signed)
PHARMACY CONSULT NOTE - FOLLOW UP  Pharmacy Consult for Electrolyte Monitoring and Replacement   Recent Labs: Potassium (mmol/L)  Date Value  06/01/2018 3.5  06/19/2014 3.5   Calcium (mg/dL)  Date Value  06/01/2018 8.6 (L)   Calcium, Total (mg/dL)  Date Value  06/19/2014 8.1 (L)   Albumin (g/dL)  Date Value  09/18/2017 4.3  10/24/2012 4.1   Sodium (mmol/L)  Date Value  06/01/2018 142  06/19/2014 141     Assessment: Pt received IV KCl 40meq x 4 on 1/9  Pt K is currently 3.5  Goal of Therapy:  K wnl's  Plan:  No additional replenishment needed prior to discharge  Lu Duffel ,PharmD Clinical Pharmacist 06/01/2018 9:27 AM

## 2018-06-18 ENCOUNTER — Ambulatory Visit (INDEPENDENT_AMBULATORY_CARE_PROVIDER_SITE_OTHER): Payer: Medicare Other | Admitting: Vascular Surgery

## 2018-06-18 ENCOUNTER — Encounter (INDEPENDENT_AMBULATORY_CARE_PROVIDER_SITE_OTHER): Payer: Self-pay | Admitting: Vascular Surgery

## 2018-06-18 VITALS — BP 158/66 | HR 71 | Resp 18 | Wt 123.0 lb

## 2018-06-18 DIAGNOSIS — L97321 Non-pressure chronic ulcer of left ankle limited to breakdown of skin: Secondary | ICD-10-CM

## 2018-06-18 DIAGNOSIS — I10 Essential (primary) hypertension: Secondary | ICD-10-CM | POA: Diagnosis not present

## 2018-06-18 DIAGNOSIS — I872 Venous insufficiency (chronic) (peripheral): Secondary | ICD-10-CM

## 2018-06-18 DIAGNOSIS — E782 Mixed hyperlipidemia: Secondary | ICD-10-CM | POA: Diagnosis not present

## 2018-06-18 DIAGNOSIS — E785 Hyperlipidemia, unspecified: Secondary | ICD-10-CM | POA: Insufficient documentation

## 2018-06-18 NOTE — Progress Notes (Signed)
MRN : [id number]  Lindsey English is a 85 y.o. (December 30, 1929) female who presents with chief complaint of  Chief Complaint  Patient presents with  . Follow-up  .  History of Present Illness:   Patient is seen for evaluation of leg pain and swelling associated with new onset ulceration. The patient first noticed the swelling remotely. The swelling is associated with pain and discoloration. The pain and swelling worsens with prolonged dependency and improves with elevation. The pain is unrelated to activity.  The patient notes that in the morning the legs are better but the leg symptoms worsened throughout the course of the day. The patient has also noted a progressive worsening of the discoloration in the ankle and shin area.   The patient notes that an ulcer has developed acutely without specific trauma and since it occurred it has been very slow to heal.  There is a moderate amount of drainage associated with the open area.  The wound is also very painful.  The patient denies claudication symptoms or rest pain symptoms.  The patient denies DJD and LS spine disease.  The patient has not had any past angiography, interventions or vascular surgery.  Elevation makes the leg symptoms better, dependency makes them much worse. The patient denies any recent changes in medications.  The patient has not been wearing graduated compression.  The patient denies a history of DVT or PE. There is no prior history of phlebitis. There is no history of primary lymphedema.  No history of malignancies. No history of trauma or groin or pelvic surgery. There is no history of radiation treatment to the groin or pelvis       Current Meds  Medication Sig  . amLODipine (NORVASC) 10 MG tablet Take 1 tablet (10 mg total) by mouth daily.  Marland Kitchen aspirin EC 81 MG tablet Take 81 mg by mouth daily.  . carvedilol (COREG) 3.125 MG tablet Take 1 tablet (3.125 mg total) by mouth 2 (two) times daily.  . cloNIDine  (CATAPRES) 0.1 MG tablet Take 0.1 mg by mouth 2 (two) times daily.  . feeding supplement, ENSURE ENLIVE, (ENSURE ENLIVE) LIQD Take 237 mLs by mouth 2 (two) times daily between meals.  . hydrALAZINE (APRESOLINE) 100 MG tablet Take 1 tablet (100 mg total) by mouth 3 (three) times daily.  . megestrol (MEGACE) 40 MG tablet Take 1 tablet (40 mg total) by mouth daily.  . Multiple Vitamin (MULTIVITAMIN WITH MINERALS) TABS tablet Take 1 tablet by mouth daily.  . multivitamin-lutein (OCUVITE-LUTEIN) CAPS capsule Take 1 capsule by mouth daily.  . simvastatin (ZOCOR) 10 MG tablet Take 10 mg by mouth at bedtime.  Marland Kitchen tiotropium (SPIRIVA HANDIHALER) 18 MCG inhalation capsule Place 1 capsule (18 mcg total) into inhaler and inhale daily.    Past Medical History:  Diagnosis Date  . Asthma   . Cancer ([place])    Right  . Diabetes mellitus without complication ([place])   . DVT (deep venous thrombosis) ([place])   . Hiatal hernia   . Hyperlipemia   . Hypertension   . Hyperthyroidism     Past Surgical History:  Procedure Laterality Date  . ABDOMINAL HYSTERECTOMY    . BREAST SURGERY Right   . TOTAL HIP ARTHROPLASTY Left     Social History Social History   Tobacco Use  . Smoking status: Former Research scientist (life sciences)  . Smokeless tobacco: Never Used  Substance Use Topics  . Alcohol use: No  . Drug use: Never    Family  History Family History  Problem Relation Age of Onset  . Hypertension Mother     Allergies  Allergen Reactions  . Shrimp [Shellfish Allergy]   . Sulfa Antibiotics Nausea Only  . Clindamycin Hcl Rash  . Keflex [Cephalexin] Rash  . 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 Vascular:  [] Pain in  legs with walking   [x] Pain in legs at rest  [] History of DVT   [] Phlebitis   [x] Swelling in legs   [] Varicose veins   [x] Non-healing ulcers Pulmonary:   [] Uses home oxygen   [] Productive cough   [] Hemoptysis   [] Wheeze  [] COPD   [] Asthma Neurologic:  [] Dizziness   [] Seizures   [] History of stroke   [] History of TIA  [] Aphasia   [] Vissual changes   [] Weakness or numbness in arm   [] Weakness or numbness in leg Musculoskeletal:   [] Joint swelling   [x] Joint pain   [x] Low back pain Hematologic:  [] Easy bruising  [] Easy bleeding   [] Hypercoagulable state   [] Anemic Gastrointestinal:  [] Diarrhea   [] Vomiting  [] Gastroesophageal reflux/heartburn   [] Difficulty swallowing. Genitourinary:  [] Chronic kidney disease   [] Difficult urination  [] Frequent urination   [] Blood in urine Skin:  [x] Rashes   [x] Ulcers  Psychological:  [] History of anxiety   []  History of major depression.  Physical Examination  Vitals:   06/18/18 1325  BP: (!) 158/66  Pulse: 71  Resp: 18  Weight: 123 lb (55.8 kg)   Body mass index is 22.5 kg/m. Gen: WD/WN, NAD Head: [place]/AT, No temporalis wasting.  Ear/Nose/Throat: Hearing grossly intact, nares w/o erythema or drainage Eyes: PER, EOMI, sclera nonicteric.  Neck: Supple, no large masses.   Pulmonary:  Good air movement, no audible wheezing bilaterally, no use of accessory muscles.  Cardiac: RRR, no JVD Vascular: 2-3+ edema of the left leg with severe venous changes of the left leg.  Venous ulcer noted in the ankle area on the left, 2.5 cm x 1.75 cm with a 1 x 1 cm satellite lesion  noninfected Vessel Right Left  Radial Palpable Palpable  PT Trace Palpable Trace Palpable  DP Palpable 2+ Palpable  Gastrointestinal: Non-distended. No guarding/no peritoneal signs.  Musculoskeletal: M/S 5/5 throughout.  No deformity or atrophy.  Neurologic: CN 2-12 intact. Symmetrical.  Speech is fluent. Motor exam as listed above. Psychiatric: Judgment intact, Mood & affect appropriate for  pt's clinical situation. Dermatologic: Venous stasis dermatitis with ulcers present on the left.  No changes consistent with cellulitis. Lymph : No lichenification or skin changes of chronic lymphedema.  CBC Lab Results  Component Value Date   WBC 6.6 06/01/2018   HGB 11.5 (L) 06/01/2018   HCT 38.2 06/01/2018   MCV 87.8 06/01/2018   PLT 174 06/01/2018    BMET    Component Value Date/Time   NA 142 06/01/2018 0450   NA 141 06/19/2014 0618   K 3.5 06/01/2018 0450   K 3.5 06/19/2014 0618   CL 106 06/01/2018 0450   CL 104 06/19/2014 0618   CO2 32 06/01/2018 0450   CO2 30 06/19/2014 0618   GLUCOSE 92 06/01/2018 0450   GLUCOSE 92 06/19/2014 0618   BUN 15 06/01/2018 0450   BUN  8 06/19/2014 0618   CREATININE 0.46 06/01/2018 0450   CREATININE 0.48 (L) 06/19/2014 0618   CALCIUM 8.6 (L) 06/01/2018 0450   CALCIUM 8.1 (L) 06/19/2014 0618   GFRNONAA >60 06/01/2018 0450   GFRNONAA >60 06/19/2014 0618   GFRNONAA >60 10/24/2012 1421   GFRAA >60 06/01/2018 0450   GFRAA >60 06/19/2014 0618   GFRAA >60 10/24/2012 1421   Estimated Creatinine Clearance: 39.9 mL/min (by C-G formula based on SCr of 0.46 mg/dL).  COAG Lab Results  Component Value Date   INR 1.1 06/11/2014    Radiology Dg Chest 2 View  Result Date: 05/29/2018 CLINICAL DATA:  Elevated blood pressure. EXAM: CHEST - 2 VIEW COMPARISON:  AP view 02/29/2016 FINDINGS: Chronic cardiomegaly with slight progression from prior. Aortic atherosclerosis and tortuosity. Vascular congestion without overt edema. No focal airspace disease, pleural effusion or pneumothorax. No acute osseous abnormalities. IMPRESSION: 1. Slight increased cardiomegaly from 2017.  Vascular congestion. 2.  Aortic Atherosclerosis (ICD10-I70.0). Electronically Signed   By: Keith Rake M.D.   On: 05/29/2018 02:02   Dg Abd 1 View  Result Date: 05/30/2018 CLINICAL DATA:  Abdominal pain EXAM: ABDOMEN - 1 VIEW COMPARISON:  None. FINDINGS: Lung bases are clear.  Nonobstructed gas pattern with mild to moderate stool. Phleboliths in the right pelvis. Status post left hip replacement. Marked arthritis of the right hip. IMPRESSION: Nonobstructed bowel-gas pattern Electronically Signed   By: Donavan Foil M.D.   On: 05/30/2018 18:42   Ct Head Wo Contrast  Result Date: 05/29/2018 CLINICAL DATA:  Headache intermittently for several weeks. Dementia. EXAM: CT HEAD WITHOUT CONTRAST TECHNIQUE: Contiguous axial images were obtained from the base of the skull through the vertex without intravenous contrast. COMPARISON:  12/26/2013 FINDINGS: BRAIN: There is sulcal and ventricular prominence consistent with superficial and central atrophy. No intraparenchymal hemorrhage, mass effect nor midline shift. Periventricular and subcortical white matter hypodensities consistent with chronic moderate small vessel ischemic disease are redemonstrated. No acute large vascular territory infarcts. No abnormal extra-axial fluid collections. Basal cisterns are not effaced and midline. VASCULAR: Moderate calcific atherosclerosis of the carotid siphons. SKULL: No skull fracture. No significant scalp soft tissue swelling. Osteoarthritis of the left TMJ with joint space narrowing and spurring. SINUSES/ORBITS: The mastoid air-cells are clear. The included paranasal sinuses are well-aerated.The included ocular globes and orbital contents are non-suspicious. OTHER: None. IMPRESSION: Atrophy with chronic appearing small vessel ischemic disease. No acute intracranial abnormality. Electronically Signed   By: Ashley Royalty M.D.   On: 05/29/2018 00:36     Assessment/Plan 1. Ulcer of ankle, left, limited to breakdown of skin ([place]) No surgery or intervention at this point in time.    I have had a long discussion with the patient regarding venous insufficiency and why it  causes symptoms, specifically venous ulceration . I have discussed with the patient the chronic skin changes that accompany venous  insufficiency and the long term sequela such as infection and recurring  ulceration.  Patient will be placed in [employer] which will be changed weekly drainage permitting.  I supervised the application of the Unna wrap in the office today.  In addition, behavioral modification including several periods of elevation of the lower extremities during the day will be continued. Achieving a position with the ankles at heart level was stressed to the patient  The patient is instructed to begin routine exercise, especially walking on a daily basis  Patient should undergo duplex ultrasound of the venous system to ensure that DVT or  reflux is not present.  Following the review of the ultrasound the patient will follow up in one week to reassess the degree of swelling and the control that Unna therapy is offering.   The patient can be assessed for graduated compression stockings or wraps as well as a Lymph Pump once the ulcers are healed.  - VAS [place] LOWER EXTREMITY VENOUS REFLUX; Future  2. Chronic venous insufficiency No surgery or intervention at this point in time.    I have had a long discussion with the patient regarding venous insufficiency and why it  causes symptoms. I have discussed with the patient the chronic skin changes that accompany venous insufficiency and the long term sequela such as infection and ulceration.  Patient will begin wearing graduated compression stockings class 1 (20-30 mmHg) or compression wraps on a daily basis a prescription was given. The patient will put the stockings on first thing in the morning and removing them in the evening. The patient is instructed specifically not to sleep in the stockings.    In addition, behavioral modification including several periods of elevation of the lower extremities during the day will be continued. I have demonstrated that proper elevation is a position with the ankles at heart level.  The patient is instructed to begin routine  exercise, especially walking on a daily basis  Patient should undergo duplex ultrasound of the venous system to ensure that DVT or reflux is not present.  Following the review of the ultrasound the patient will follow up in 2-3 months to reassess the degree of swelling and the control that graduated compression stockings or compression wraps  is offering.   The patient can be assessed for a Lymph Pump at that time - VAS [place] LOWER EXTREMITY VENOUS REFLUX; Future  3. Mixed hyperlipidemia Continue statin as ordered and reviewed, no changes at this time   4. Essential hypertension Continue antihypertensive medications as already ordered, these medications have been reviewed and there are no changes at this time.     Hortencia Pilar, MD  06/18/2018 9:19 PM

## 2018-06-25 ENCOUNTER — Encounter (INDEPENDENT_AMBULATORY_CARE_PROVIDER_SITE_OTHER): Payer: Medicare Other

## 2018-06-25 ENCOUNTER — Encounter (INDEPENDENT_AMBULATORY_CARE_PROVIDER_SITE_OTHER): Payer: Self-pay

## 2018-07-02 ENCOUNTER — Ambulatory Visit (INDEPENDENT_AMBULATORY_CARE_PROVIDER_SITE_OTHER): Payer: Medicare Other | Admitting: Nurse Practitioner

## 2018-07-02 VITALS — BP 141/57 | HR 60 | Resp 10 | Ht 63.0 in | Wt 121.0 lb

## 2018-07-02 DIAGNOSIS — L97321 Non-pressure chronic ulcer of left ankle limited to breakdown of skin: Secondary | ICD-10-CM | POA: Diagnosis not present

## 2018-07-02 DIAGNOSIS — L97311 Non-pressure chronic ulcer of right ankle limited to breakdown of skin: Secondary | ICD-10-CM

## 2018-07-02 NOTE — Progress Notes (Signed)
History of Present Illness  There is no documented history at this time  Assessments & Plan   There are no diagnoses linked to this encounter.    Additional instructions  Subjective:  Patient presents with venous ulcer of the Bilateral lower extremity.    Procedure:  3 layer unna wrap was placed Bilateral lower extremity.   Plan:   Follow up in one week.  

## 2018-07-09 ENCOUNTER — Encounter (INDEPENDENT_AMBULATORY_CARE_PROVIDER_SITE_OTHER): Payer: Self-pay | Admitting: Vascular Surgery

## 2018-07-09 ENCOUNTER — Encounter (INDEPENDENT_AMBULATORY_CARE_PROVIDER_SITE_OTHER): Payer: Medicare Other

## 2018-07-09 ENCOUNTER — Ambulatory Visit (INDEPENDENT_AMBULATORY_CARE_PROVIDER_SITE_OTHER): Payer: Medicare Other

## 2018-07-09 ENCOUNTER — Other Ambulatory Visit: Payer: Self-pay

## 2018-07-09 ENCOUNTER — Ambulatory Visit (INDEPENDENT_AMBULATORY_CARE_PROVIDER_SITE_OTHER): Payer: Medicare Other | Admitting: Vascular Surgery

## 2018-07-09 VITALS — BP 145/63 | HR 66 | Resp 14 | Ht 64.0 in | Wt 123.0 lb

## 2018-07-09 DIAGNOSIS — L97311 Non-pressure chronic ulcer of right ankle limited to breakdown of skin: Secondary | ICD-10-CM

## 2018-07-09 DIAGNOSIS — I890 Lymphedema, not elsewhere classified: Secondary | ICD-10-CM | POA: Diagnosis not present

## 2018-07-09 DIAGNOSIS — L97321 Non-pressure chronic ulcer of left ankle limited to breakdown of skin: Secondary | ICD-10-CM

## 2018-07-09 DIAGNOSIS — I872 Venous insufficiency (chronic) (peripheral): Secondary | ICD-10-CM | POA: Diagnosis not present

## 2018-07-09 NOTE — Progress Notes (Signed)
Subjective:    Patient ID: Lindsey English, female    DOB: Jan 21, 1930, 85 y.o.   MRN: [id number] Chief Complaint  Patient presents with  . Follow-up   Patient presents for a monthly bilateral lower extremity edema / wound / unna boot therapy follow-up.  The patient has been undergoing 3 layer zinc oxide Unna boot therapy to the bilateral legs for approximately 4 weeks.  The patient was seen with her son.  The patient and her son have noticed a market improvement in the patient's edema.  The ulcerations to the right lower extremity have healed.  The patient still does have a small ulcer to the lateral aspect of the left calf.  The son notes that this has shown market improvement in healing.  The son also notes that the patient may possibly have a lymphedema pump at home.  He believes that one was ordered approximately 3 to 4 years ago.  The son states that he will look for this.  The patient underwent a left lower extremity venous duplex study which was notable for: Left: Chronic reflux noted in the common femoral vein and saphenofemoral junction.  No evidence of deep vein or superficial venous thrombosis.  Patient denies any fever, nausea vomiting.  Patient denies any worsening bilateral lower extremity pain.   Review of Systems  Constitutional: Negative.   HENT: Negative.   Eyes: Negative.   Respiratory: Negative.   Cardiovascular: Positive for leg swelling.  Gastrointestinal: Negative.   Endocrine: Negative.   Genitourinary: Negative.   Musculoskeletal: Negative.   Skin: Positive for wound.  Allergic/Immunologic: Negative.   Neurological: Negative.   Hematological: Negative.   Psychiatric/Behavioral: Negative.       Objective:   Physical Exam Vitals signs reviewed.  Constitutional:      Appearance: Normal appearance. She is normal weight.  HENT:     Head: Normocephalic and atraumatic.     Right Ear: External ear normal.     Left Ear: External ear normal.     Nose: Nose normal.       Mouth/Throat:     Mouth: Mucous membranes are moist.     Pharynx: Oropharynx is clear.  Eyes:     Extraocular Movements: Extraocular movements intact.     Conjunctiva/sclera: Conjunctivae normal.     Pupils: Pupils are equal, round, and reactive to light.  Neck:     Musculoskeletal: Normal range of motion.  Cardiovascular:     Rate and Rhythm: Normal rate and regular rhythm.  Pulmonary:     Effort: Pulmonary effort is normal.     Breath sounds: Normal breath sounds.  Musculoskeletal: Normal range of motion.        General: No swelling.  Skin:    Comments: Right lower extremity: Ulcerations have healed Left lower extremity: There is a 3 cm x 3 cm circular ulceration noted to the lateral aspect of the calf.  100% granulation tissue to the wound bed.  There is no foul odor.  There is no drainage.   Neurological:     General: No focal deficit present.     Mental Status: She is alert and oriented to person, place, and time. Mental status is at baseline.  Psychiatric:        Mood and Affect: Mood normal.        Behavior: Behavior normal.        Thought Content: Thought content normal.        Judgment: Judgment normal.  BP (!) 145/63 (BP Location: Left Arm, Patient Position: Sitting, Cuff Size: Small)   Pulse 66   Resp 14   Ht 5\' 4"  (1.626 m)   Wt 123 lb (55.8 kg)   BMI 21.11 kg/m   Past Medical History:  Diagnosis Date  . Asthma   . Cancer ([place])    Right  . Diabetes mellitus without complication ([place])   . DVT (deep venous thrombosis) ([place])   . Hiatal hernia   . Hyperlipemia   . Hypertension   . Hyperthyroidism    Social History   Socioeconomic History  . Marital status: Widowed    Spouse name: Not on file  . Number of children: Not on file  . Years of education: Not on file  . Highest education level: Not on file  Occupational History  . Not on file  Social Needs  . Financial resource strain: Not hard at all  . Food insecurity:    Worry: Never true     Inability: Never true  . Transportation needs:    Medical: No    Non-medical: No  Tobacco Use  . Smoking status: Former Research scientist (life sciences)  . Smokeless tobacco: Never Used  Substance and Sexual Activity  . Alcohol use: No  . Drug use: Never  . Sexual activity: Not Currently  Lifestyle  . Physical activity:    Days per week: 0 days    Minutes per session: 0 min  . Stress: Rather much  Relationships  . Social connections:    Talks on phone: Twice a week    Gets together: Once a week    Attends religious service: Never    Active member of club or organization: No    Attends meetings of clubs or organizations: Never    Relationship status: Widowed  . Intimate partner violence:    Fear of current or ex partner: No    Emotionally abused: No    Physically abused: No    Forced sexual activity: No  Other Topics Concern  . Not on file  Social History Narrative   Pt recently widowed.  Has daughters for support.   Past Surgical History:  Procedure Laterality Date  . ABDOMINAL HYSTERECTOMY    . BREAST SURGERY Right   . TOTAL HIP ARTHROPLASTY Left    Family History  Problem Relation Age of Onset  . Hypertension Mother    Allergies  Allergen Reactions  . Shrimp [Shellfish Allergy]   . Sulfa Antibiotics Nausea Only  . Clindamycin Hcl Rash  . Keflex [Cephalexin] Rash  . 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  The patient has been undergoing 3 layer zinc oxide Unna boot therapy to the bilateral legs for approximately 4 weeks.  The patient was seen with her son.  The patient and her son have noticed a market improvement in the patient's edema.  The ulcerations to the right lower extremity have healed.  The patient still does have a small ulcer to the lateral aspect of the left calf.  The son  notes that this has shown market improvement in healing.  The son also notes that the patient may possibly have a lymphedema pump at home.  He believes that one was ordered approximately 3 to 4 years ago.  The son states that he  will look for this.  The patient underwent a left lower extremity venous duplex study which was notable for: Left: Chronic reflux noted in the common femoral vein and saphenofemoral junction.  No evidence of deep vein or superficial venous thrombosis.  Patient denies any fever, nausea vomiting.  Patient denies any worsening bilateral lower extremity pain.   1. Chronic venous insufficiency - New Patient with chronic reflux noted in the common femoral vein. Due to the location of this reflux the patient is not a candidate for any type of endovenous laser ablation or sclerotherapy to this vein The patient still has an active ulcer located to the left lateral calf. Would recommend 3 layer zinc oxide Unna wraps for at least another month, changed weekly until that ulceration has healed. This was placed today. Once the patient's ulcerations have healed I will transition her into medical grade 1 compression socks The son believes that the patient does have a lymphedema pump which she received approximately 3 to 4 weeks ago at home. The son states that he will look for this. I will see the patient back in approximately 1 month to assess her wound healing Patient is to continue to elevate her legs heart level or higher as much as possible  2. Lymphedema - Stable As above  3. Ulcer of ankle, right, limited to breakdown of skin ([place]) - Resolved As above  4. Ulcer of ankle, left, limited to breakdown of skin ([facility]) - Stable As above  Current Outpatient Medications on File Prior to Visit  Medication Sig Dispense Refill  . amLODipine (NORVASC) 10 MG tablet Take 1 tablet (10 mg total) by mouth daily. 30 tablet 0  . aspirin EC 81 MG tablet Take 81 mg by mouth daily.    .  carvedilol (COREG) 3.125 MG tablet Take 1 tablet (3.125 mg total) by mouth 2 (two) times daily. 60 tablet 11  . cloNIDine (CATAPRES) 0.1 MG tablet Take 0.1 mg by mouth 2 (two) times daily.    . feeding supplement, ENSURE ENLIVE, (ENSURE ENLIVE) LIQD Take 237 mLs by mouth 2 (two) times daily between meals. 237 mL 12  . hydrALAZINE (APRESOLINE) 100 MG tablet Take 1 tablet (100 mg total) by mouth 3 (three) times daily. 120 tablet 11  . Multiple Vitamin (MULTIVITAMIN WITH MINERALS) TABS tablet Take 1 tablet by mouth daily. 30 tablet 0  . multivitamin-lutein (OCUVITE-LUTEIN) CAPS capsule Take 1 capsule by mouth daily. 30 capsule 0  . simvastatin (ZOCOR) 10 MG tablet Take 10 mg by mouth at bedtime.    Marland Kitchen tiotropium (SPIRIVA HANDIHALER) 18 MCG inhalation capsule Place 1 capsule (18 mcg total) into inhaler and inhale daily. 30 capsule 0  . megestrol (MEGACE) 40 MG tablet Take 1 tablet (40 mg total) by mouth daily. (Patient not taking: Reported on 07/09/2018) 30 tablet 0   No current facility-administered medications on file prior to visit.    There are no Patient Instructions on file for this visit. No follow-ups on file.  Bryan Goin A Tomie Elko, PA-C

## 2018-07-15 ENCOUNTER — Encounter (INDEPENDENT_AMBULATORY_CARE_PROVIDER_SITE_OTHER): Payer: Self-pay | Admitting: Nurse Practitioner

## 2018-07-16 ENCOUNTER — Ambulatory Visit (INDEPENDENT_AMBULATORY_CARE_PROVIDER_SITE_OTHER): Payer: Medicare Other | Admitting: Nurse Practitioner

## 2018-07-16 ENCOUNTER — Encounter (INDEPENDENT_AMBULATORY_CARE_PROVIDER_SITE_OTHER): Payer: Self-pay

## 2018-07-16 VITALS — BP 167/51 | HR 67 | Resp 16 | Ht 64.0 in

## 2018-07-16 DIAGNOSIS — L97321 Non-pressure chronic ulcer of left ankle limited to breakdown of skin: Secondary | ICD-10-CM | POA: Diagnosis not present

## 2018-07-16 DIAGNOSIS — L97311 Non-pressure chronic ulcer of right ankle limited to breakdown of skin: Secondary | ICD-10-CM

## 2018-07-16 NOTE — Progress Notes (Signed)
History of Present Illness  There is no documented history at this time  Assessments & Plan   There are no diagnoses linked to this encounter.    Additional instructions  Subjective:  Patient presents with venous ulcer of the Bilateral lower extremity.    Procedure:  3 layer unna wrap was placed Bilateral lower extremity.   Plan:   Follow up in one week.  

## 2018-07-23 ENCOUNTER — Ambulatory Visit (INDEPENDENT_AMBULATORY_CARE_PROVIDER_SITE_OTHER): Payer: Medicare Other | Admitting: Nurse Practitioner

## 2018-07-23 ENCOUNTER — Encounter (INDEPENDENT_AMBULATORY_CARE_PROVIDER_SITE_OTHER): Payer: Self-pay | Admitting: Nurse Practitioner

## 2018-07-23 VITALS — BP 154/73 | HR 69 | Resp 14 | Ht 60.0 in | Wt 120.0 lb

## 2018-07-23 DIAGNOSIS — L97321 Non-pressure chronic ulcer of left ankle limited to breakdown of skin: Secondary | ICD-10-CM | POA: Diagnosis not present

## 2018-07-23 DIAGNOSIS — L97311 Non-pressure chronic ulcer of right ankle limited to breakdown of skin: Secondary | ICD-10-CM

## 2018-07-23 NOTE — Progress Notes (Signed)
History of Present Illness  There is no documented history at this time  Assessments & Plan   There are no diagnoses linked to this encounter.    Additional instructions  Subjective:  Patient presents with venous ulcer of the Bilateral lower extremity.    Procedure:  3 layer unna wrap was placed Bilateral lower extremity.   Plan:   Follow up in one week.  

## 2018-07-30 ENCOUNTER — Encounter (INDEPENDENT_AMBULATORY_CARE_PROVIDER_SITE_OTHER): Payer: Self-pay | Admitting: Nurse Practitioner

## 2018-07-30 ENCOUNTER — Ambulatory Visit (INDEPENDENT_AMBULATORY_CARE_PROVIDER_SITE_OTHER): Payer: Medicare Other | Admitting: Nurse Practitioner

## 2018-07-30 VITALS — BP 139/62 | HR 67 | Resp 16 | Ht 64.0 in | Wt 119.8 lb

## 2018-07-30 DIAGNOSIS — I10 Essential (primary) hypertension: Secondary | ICD-10-CM

## 2018-07-30 DIAGNOSIS — L97321 Non-pressure chronic ulcer of left ankle limited to breakdown of skin: Secondary | ICD-10-CM

## 2018-07-30 DIAGNOSIS — Z87891 Personal history of nicotine dependence: Secondary | ICD-10-CM

## 2018-07-30 DIAGNOSIS — I890 Lymphedema, not elsewhere classified: Secondary | ICD-10-CM | POA: Diagnosis not present

## 2018-07-30 DIAGNOSIS — E782 Mixed hyperlipidemia: Secondary | ICD-10-CM

## 2018-07-30 DIAGNOSIS — Z79899 Other long term (current) drug therapy: Secondary | ICD-10-CM

## 2018-07-31 ENCOUNTER — Encounter (INDEPENDENT_AMBULATORY_CARE_PROVIDER_SITE_OTHER): Payer: Self-pay | Admitting: Nurse Practitioner

## 2018-07-31 NOTE — Progress Notes (Signed)
SUBJECTIVE:  Patient ID: Lindsey English, female    DOB: March 24, 1930, 85 y.o.   MRN: [id number] No chief complaint on file.   HPI  Lindsey English is a 85 y.o. female patient presents today for wound follow-up.  Her right lower extremity has no evidence of ulceration at this time.  The left lower extremity still has an ulceration left.  Otherwise, the edema looks very good.  Patient has any fever, chills, nausea, vomiting or diarrhea.  She denies any chest pain or shortness of breath.  They expressed very little difficulty with utilizing Unna wraps.  Past Medical History:  Diagnosis Date  . Asthma   . Cancer ([place])    Right  . Diabetes mellitus without complication ([place])   . DVT (deep venous thrombosis) ([place])   . Hiatal hernia   . Hyperlipemia   . Hypertension   . Hyperthyroidism     Past Surgical History:  Procedure Laterality Date  . ABDOMINAL HYSTERECTOMY    . BREAST SURGERY Right   . TOTAL HIP ARTHROPLASTY Left     Social History   Socioeconomic History  . Marital status: Widowed    Spouse name: Not on file  . Number of children: Not on file  . Years of education: Not on file  . Highest education level: Not on file  Occupational History  . Not on file  Social Needs  . Financial resource strain: Not hard at all  . Food insecurity:    Worry: Never true    Inability: Never true  . Transportation needs:    Medical: No    Non-medical: No  Tobacco Use  . Smoking status: Former Research scientist (life sciences)  . Smokeless tobacco: Never Used  Substance and Sexual Activity  . Alcohol use: No  . Drug use: Never  . Sexual activity: Not Currently  Lifestyle  . Physical activity:    Days per week: 0 days    Minutes per session: 0 min  . Stress: Rather much  Relationships  . Social connections:    Talks on phone: Twice a week    Gets together: Once a week    Attends religious service: Never    Active member of club or organization: No    Attends meetings of clubs or organizations: Never      Relationship status: Widowed  . Intimate partner violence:    Fear of current or ex partner: No    Emotionally abused: No    Physically abused: No    Forced sexual activity: No  Other Topics Concern  . Not on file  Social History Narrative   Pt recently widowed.  Has daughters for support.    Family History  Problem Relation Age of Onset  . Hypertension Mother     Allergies  Allergen Reactions  . Shrimp [Shellfish Allergy]   . Sulfa Antibiotics Nausea Only  . Clindamycin Hcl Rash  . Keflex [Cephalexin] Rash  . 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 [] Shortness of breath at rest Vascular:  [] Pain in legs with walking   [] Pain in legs with standing [] Pain in legs when laying flat   [] Claudication    []   Pain in feet when laying flat    [] History of DVT   [] Phlebitis   [x] Swelling in legs   [x] Varicose veins   [x] Non-healing ulcers Pulmonary:   [] Uses home oxygen   [] Productive cough   [] Hemoptysis   [] Wheeze  [] COPD   [] Asthma Neurologic:  [] Dizziness   [] Seizures  [] Blackouts [] History of stroke   [] History of TIA  [] Aphasia   [] Temporary Blindness   [] Weakness or numbness in arm   [] Weakness or numbness in leg Musculoskeletal:   [] Joint swelling   [] Joint pain   [] Low back pain  []  History of Knee Replacement [] Arthritis [] back Surgeries  []  Spinal Stenosis    Hematologic:  [] Easy bruising  [] Easy bleeding   [] Hypercoagulable state   [] Anemic Gastrointestinal:  [] Diarrhea   [] Vomiting  [] Gastroesophageal reflux/heartburn   [] Difficulty swallowing. [] Abdominal pain Genitourinary:  [] Chronic kidney disease   [] Difficult urination  [] Anuric   [] Blood in  urine [] Frequent urination  [] Burning with urination   [] Hematuria Skin:  [] Rashes   [x] Ulcers [] Wounds Psychological:  [] History of anxiety   []  History of major depression  [x]  Memory Difficulties      OBJECTIVE:   Physical Exam  BP 139/62 (BP Location: Right Arm)   Pulse 67   Resp 16   Ht 5\' 4"  (1.626 m)   Wt 119 lb 12.8 oz (54.3 kg)   BMI 20.56 kg/m   Gen: WD/WN, NAD Head: [place]/AT, No temporalis wasting.  Ear/Nose/Throat: Hearing grossly intact, nares w/o erythema or drainage Eyes: PER, EOMI, sclera nonicteric.  Neck: Supple, no masses.  No JVD.  Pulmonary:  Good air movement, no use of accessory muscles.  Cardiac: RRR Vascular:  Vessel Right Left  Radial Palpable Palpable  Dorsalis Pedis Palpable Palpable  Posterior Tibial Palpable Palpable   Gastrointestinal: soft, non-distended. No guarding/no peritoneal signs.  Musculoskeletal: M/S 5/5 throughout.  No deformity or atrophy.  Neurologic: Pain and light touch intact in extremities.  Symmetrical.  Speech is fluent. Motor exam as listed above. Psychiatric: Judgment intact, Mood & affect appropriate for pt's clinical situation. Dermatologic: No Venous rashes. No Ulcers Noted.  No changes consistent with cellulitis. Lymph : No Cervical lymphadenopathy, no lichenification or skin changes of chronic lymphedema.       ASSESSMENT AND PLAN:  1. Ulcer of ankle, left, limited to breakdown of skin ([place]) No surgery or intervention at this point in time.    I have had a long discussion with the patient regarding venous insufficiency and why it  causes symptoms, specifically venous ulceration . I have discussed with the patient the chronic skin changes that accompany venous insufficiency and the long term sequela such as infection and recurring  ulceration.  Patient will be placed in [employer] which will be changed weekly drainage permitting.  In addition, behavioral modification including several periods of elevation of the lower  extremities during the day will be continued. Achieving a position with the ankles at heart level was stressed to the patient  The patient is instructed to begin routine exercise, especially walking on a daily basis  We will continue to have the Unna boot on the left lower extremity however the right lower extremity can transition to medical grade 1 compression stockings.  The boots will be changed weekly in office and we will have a provider visit in 4 weeks to evaluate progress.  2. Essential hypertension Continue antihypertensive medications as already ordered, these medications have been reviewed and there are no changes at this time.  3. Mixed hyperlipidemia Continue statin as ordered and reviewed, no changes at this time   4. Lymphedema See above regarding conversation with conservative therapy.  Patient will continue to utilize conservative therapy for the right lower extremity in the form of medical grade 1 compression stockings.  The left lower extremity will continue to have a wrap.  Current Outpatient Medications on File Prior to Visit  Medication Sig Dispense Refill  . amLODipine (NORVASC) 10 MG tablet Take 1 tablet (10 mg total) by mouth daily. 30 tablet 0  . aspirin EC 81 MG tablet Take 81 mg by mouth daily.    . carvedilol (COREG) 3.125 MG tablet Take 1 tablet (3.125 mg total) by mouth 2 (two) times daily. 60 tablet 11  . cloNIDine (CATAPRES) 0.1 MG tablet Take 0.1 mg by mouth 2 (two) times daily.    . feeding supplement, ENSURE ENLIVE, (ENSURE ENLIVE) LIQD Take 237 mLs by mouth 2 (two) times daily between meals. 237 mL 12  . hydrALAZINE (APRESOLINE) 100 MG tablet Take 1 tablet (100 mg total) by mouth 3 (three) times daily. 120 tablet 11  . Multiple Vitamin (MULTIVITAMIN WITH MINERALS) TABS tablet Take 1 tablet by mouth daily. 30 tablet 0  . multivitamin-lutein (OCUVITE-LUTEIN) CAPS capsule Take 1 capsule by mouth daily. 30 capsule 0  . simvastatin (ZOCOR) 10 MG tablet Take  10 mg by mouth at bedtime.    Marland Kitchen tiotropium (SPIRIVA HANDIHALER) 18 MCG inhalation capsule Place 1 capsule (18 mcg total) into inhaler and inhale daily. 30 capsule 0  . megestrol (MEGACE) 40 MG tablet Take 1 tablet (40 mg total) by mouth daily. (Patient not taking: Reported on 07/09/2018) 30 tablet 0   No current facility-administered medications on file prior to visit.     There are no Patient Instructions on file for this visit. No follow-ups on file.   Kris Hartmann, NP  This note was completed with Sales executive.  Any errors are purely unintentional.

## 2018-08-06 ENCOUNTER — Other Ambulatory Visit: Payer: Self-pay

## 2018-08-06 ENCOUNTER — Encounter (INDEPENDENT_AMBULATORY_CARE_PROVIDER_SITE_OTHER): Payer: Self-pay

## 2018-08-06 ENCOUNTER — Ambulatory Visit (INDEPENDENT_AMBULATORY_CARE_PROVIDER_SITE_OTHER): Payer: Medicare Other | Admitting: Nurse Practitioner

## 2018-08-06 VITALS — BP 156/62 | HR 69 | Resp 16 | Ht 64.0 in | Wt 122.6 lb

## 2018-08-06 DIAGNOSIS — L97311 Non-pressure chronic ulcer of right ankle limited to breakdown of skin: Secondary | ICD-10-CM

## 2018-08-06 DIAGNOSIS — L97321 Non-pressure chronic ulcer of left ankle limited to breakdown of skin: Secondary | ICD-10-CM | POA: Diagnosis not present

## 2018-08-06 NOTE — Progress Notes (Signed)
History of Present Illness  There is no documented history at this time  Assessments & Plan   There are no diagnoses linked to this encounter.    Additional instructions  Subjective:  Patient presents with venous ulcer of the Bilateral lower extremity.    Procedure:  3 layer unna wrap was placed Bilateral lower extremity.   Plan:   Follow up in one week.  

## 2018-08-07 ENCOUNTER — Encounter (INDEPENDENT_AMBULATORY_CARE_PROVIDER_SITE_OTHER): Payer: Self-pay | Admitting: Nurse Practitioner

## 2018-08-13 ENCOUNTER — Ambulatory Visit (INDEPENDENT_AMBULATORY_CARE_PROVIDER_SITE_OTHER): Payer: Medicare Other | Admitting: Nurse Practitioner

## 2018-08-13 ENCOUNTER — Encounter (INDEPENDENT_AMBULATORY_CARE_PROVIDER_SITE_OTHER): Payer: Self-pay | Admitting: Nurse Practitioner

## 2018-08-13 ENCOUNTER — Other Ambulatory Visit: Payer: Self-pay

## 2018-08-13 VITALS — BP 144/59 | HR 70 | Resp 16 | Ht 64.0 in | Wt 123.0 lb

## 2018-08-13 DIAGNOSIS — L97311 Non-pressure chronic ulcer of right ankle limited to breakdown of skin: Secondary | ICD-10-CM | POA: Diagnosis not present

## 2018-08-13 DIAGNOSIS — L97321 Non-pressure chronic ulcer of left ankle limited to breakdown of skin: Secondary | ICD-10-CM | POA: Diagnosis not present

## 2018-08-13 NOTE — Progress Notes (Signed)
History of Present Illness  There is no documented history at this time  Assessments & Plan   There are no diagnoses linked to this encounter.    Additional instructions  Subjective:  Patient presents with venous ulcer of the Bilateral lower extremity.    Procedure:  3 layer unna wrap was placed Bilateral lower extremity.   Plan:   Follow up in one week.  

## 2018-08-16 ENCOUNTER — Encounter (INDEPENDENT_AMBULATORY_CARE_PROVIDER_SITE_OTHER): Payer: Self-pay | Admitting: Nurse Practitioner

## 2018-08-20 ENCOUNTER — Ambulatory Visit (INDEPENDENT_AMBULATORY_CARE_PROVIDER_SITE_OTHER): Payer: Medicare Other | Admitting: Nurse Practitioner

## 2018-08-20 ENCOUNTER — Other Ambulatory Visit: Payer: Self-pay

## 2018-08-20 ENCOUNTER — Encounter (INDEPENDENT_AMBULATORY_CARE_PROVIDER_SITE_OTHER): Payer: Self-pay | Admitting: Nurse Practitioner

## 2018-08-20 VITALS — BP 157/63 | HR 80 | Resp 16 | Wt 123.6 lb

## 2018-08-20 DIAGNOSIS — E782 Mixed hyperlipidemia: Secondary | ICD-10-CM | POA: Diagnosis not present

## 2018-08-20 DIAGNOSIS — I890 Lymphedema, not elsewhere classified: Secondary | ICD-10-CM | POA: Diagnosis not present

## 2018-08-20 DIAGNOSIS — L97321 Non-pressure chronic ulcer of left ankle limited to breakdown of skin: Secondary | ICD-10-CM

## 2018-08-20 DIAGNOSIS — Z87891 Personal history of nicotine dependence: Secondary | ICD-10-CM

## 2018-08-20 DIAGNOSIS — L97311 Non-pressure chronic ulcer of right ankle limited to breakdown of skin: Secondary | ICD-10-CM

## 2018-08-20 DIAGNOSIS — Z79899 Other long term (current) drug therapy: Secondary | ICD-10-CM

## 2018-08-20 DIAGNOSIS — I10 Essential (primary) hypertension: Secondary | ICD-10-CM | POA: Diagnosis not present

## 2018-08-20 NOTE — Progress Notes (Signed)
SUBJECTIVE:  Patient ID: Lindsey English, female    DOB: 1930-01-04, 85 y.o.   MRN: [id number] Chief Complaint  Patient presents with  . Follow-up    unna check    HPI  Lindsey English is a 85 y.o. female Patient is seen for follow up evaluation of leg pain and swelling associated with venous ulceration. The patient was recently seen here and started on Unna boot therapy.  The swelling abruptly became much worse bilaterally and is associated with pain and discoloration. The pain and swelling worsens with prolonged dependency and improves with elevation.  The patient notes that in the morning the legs are better but the leg symptoms worsened throughout the course of the day. The patient has also noted a progressive worsening of the discoloration in the ankle and shin area.   The patient notes that an ulcer has developed acutely without specific trauma and since it occurred it has been very slow to heal.  There is a moderate amount of drainage associated with the open area.    The patient states that they have been elevating as much as possible. The patient denies any recent changes in medications.  The patient denies a history of DVT or PE. There is no prior history of phlebitis. There is no history of primary lymphedema.  No SOB or increased cough.  No sputum production.  No recent episodes of CHF exacerbation.   Past Medical History:  Diagnosis Date  . Asthma   . Cancer ([place])    Right  . Diabetes mellitus without complication ([place])   . DVT (deep venous thrombosis) ([place])   . Hiatal hernia   . Hyperlipemia   . Hypertension   . Hyperthyroidism     Past Surgical History:  Procedure Laterality Date  . ABDOMINAL HYSTERECTOMY    . BREAST SURGERY Right   . TOTAL HIP ARTHROPLASTY Left     Social History   Socioeconomic History  . Marital status: Widowed    Spouse name: Not on file  . Number of children: Not on file  . Years of education: Not on file  . Highest education  level: Not on file  Occupational History  . Not on file  Social Needs  . Financial resource strain: Not hard at all  . Food insecurity:    Worry: Never true    Inability: Never true  . Transportation needs:    Medical: No    Non-medical: No  Tobacco Use  . Smoking status: Former Research scientist (life sciences)  . Smokeless tobacco: Never Used  Substance and Sexual Activity  . Alcohol use: No  . Drug use: Never  . Sexual activity: Not Currently  Lifestyle  . Physical activity:    Days per week: 0 days    Minutes per session: 0 min  . Stress: Rather much  Relationships  . Social connections:    Talks on phone: Twice a week    Gets together: Once a week    Attends religious service: Never    Active member of club or organization: No    Attends meetings of clubs or organizations: Never    Relationship status: Widowed  . Intimate partner violence:    Fear of current or ex partner: No    Emotionally abused: No    Physically abused: No    Forced sexual activity: No  Other Topics Concern  . Not on file  Social History Narrative   Pt recently widowed.  Has daughters for support.  Family History  Problem Relation Age of Onset  . Hypertension Mother     Allergies  Allergen Reactions  . Shrimp [Shellfish Allergy]   . Sulfa Antibiotics Nausea Only  . Clindamycin Hcl Rash  . Keflex [Cephalexin] Rash  . 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 [] Shortness of breath at rest Vascular:  [] Pain in legs with walking   [] Pain in legs with standing [] Pain in legs when laying flat   [] Claudication    [] Pain in feet when laying flat    [] History of DVT   [] Phlebitis    [x] Swelling in legs   [x] Varicose veins   [x] Non-healing ulcers Pulmonary:   [] Uses home oxygen   [] Productive cough   [] Hemoptysis   [] Wheeze  [] COPD   [] Asthma Neurologic:  [] Dizziness   [] Seizures  [] Blackouts [] History of stroke   [] History of TIA  [] Aphasia   [] Temporary Blindness   [] Weakness or numbness in arm   [x] Weakness or numbness in leg Musculoskeletal:   [] Joint swelling   [x] Joint pain   [x] Low back pain  []  History of Knee Replacement [] Arthritis [] back Surgeries  []  Spinal Stenosis    Hematologic:  [] Easy bruising  [] Easy bleeding   [] Hypercoagulable state   [] Anemic Gastrointestinal:  [] Diarrhea   [] Vomiting  [] Gastroesophageal reflux/heartburn   [] Difficulty swallowing. [] Abdominal pain Genitourinary:  [] Chronic kidney disease   [] Difficult urination  [] Anuric   [] Blood in urine [] Frequent urination  [] Burning with urination   [] Hematuria Skin:  [] Rashes   [x] Ulcers [] Wounds Psychological:  [] History of anxiety   []  History of major depression  [x]  Memory Difficulties      OBJECTIVE:   Physical Exam  BP (!) 157/63 (BP Location: Right Arm)   Pulse 80   Resp 16   Wt 123 lb 9.6 oz (56.1 kg)   BMI 21.22 kg/m   Gen: WD/WN, NAD Head: [place]/AT, No temporalis wasting.  Ear/Nose/Throat: Hearing grossly intact, nares w/o erythema or drainage Eyes: PER, EOMI, sclera nonicteric.  Neck: Supple, no masses.  No JVD.  Pulmonary:  Good air movement, no use of accessory muscles.  Cardiac: RRR Vascular:  Small shallow ulcer on right lower extremity.  Larger healing ulcer on left calf.  2+ edema bilaterally Vessel Right Left  Radial Palpable Palpable  Dorsalis Pedis Palpable Palpable  Posterior Tibial Palpable Palpable   Gastrointestinal: soft, non-distended. No guarding/no peritoneal signs.  Musculoskeletal: M/S 5/5 throughout.  No deformity or atrophy.  Neurologic: Pain and light touch intact in extremities.  Symmetrical.  Speech is fluent. Motor exam as listed above. Psychiatric:  Judgment intact, Mood & affect appropriate for pt's clinical situation. Dermatologic: No Venous rashes. No Ulcers Noted.  No changes consistent with cellulitis. Lymph : No Cervical lymphadenopathy, no lichenification or skin changes of chronic lymphedema.       ASSESSMENT AND PLAN:  1. Ulcer of ankle, left, limited to breakdown of skin ([place]) No surgery or intervention at this point in time.    I have had a long discussion with the patient regarding venous insufficiency and why it  causes symptoms, specifically venous ulceration . I have discussed with the patient the chronic skin changes that  accompany venous insufficiency and the long term sequela such as infection and recurring  ulceration.  Patient will be placed in [employer] which will be changed weekly drainage permitting.  In addition, behavioral modification including several periods of elevation of the lower extremities during the day will be continued. Achieving a position with the ankles at heart level was stressed to the patient  The patient is instructed to begin routine exercise, especially walking on a daily basis  The patient will be reassessed in 4 weeks in order to determine whether or not Unna boot therapy is still appropriate or if transitioning to standard therapy for lymphedema would be appropriate.  She will present in office weekly for Unna wrap changes.   2. Ulcer of ankle, right, limited to breakdown of skin ([place]) See above  3. Essential hypertension Continue antihypertensive medications as already ordered, these medications have been reviewed and there are no changes at this time.   4. Mixed hyperlipidemia Continue statin as ordered and reviewed, no changes at this time   5. Lymphedema No surgery or intervention at this point in time.  I have reviewed my discussion with the patient regarding venous insufficiency and why it causes symptoms. I have discussed with the patient the chronic skin changes that  accompany venous insufficiency and the long term sequela such as ulceration. Patient will contnue wearing graduated compression stockings on a daily basis, as this has provided excellent control of his edema. The patient will put the stockings on first thing in the morning and removing them in the evening. The patient is reminded not to sleep in the stockings.  In addition, behavioral modification including elevation during the day will be initiated. Exercise is strongly encouraged.  Once the patient is out of Unna wraps we will have them transition into medical grade 1 compression stockings, with continued elevation and exercise of the lower extremities.    Current Outpatient Medications on File Prior to Visit  Medication Sig Dispense Refill  . amLODipine (NORVASC) 10 MG tablet Take 1 tablet (10 mg total) by mouth daily. 30 tablet 0  . aspirin EC 81 MG tablet Take 81 mg by mouth daily.    . carvedilol (COREG) 3.125 MG tablet Take 1 tablet (3.125 mg total) by mouth 2 (two) times daily. 60 tablet 11  . cloNIDine (CATAPRES) 0.1 MG tablet Take 0.1 mg by mouth 2 (two) times daily.    . feeding supplement, ENSURE ENLIVE, (ENSURE ENLIVE) LIQD Take 237 mLs by mouth 2 (two) times daily between meals. 237 mL 12  . hydrALAZINE (APRESOLINE) 100 MG tablet Take 1 tablet (100 mg total) by mouth 3 (three) times daily. 120 tablet 11  . Multiple Vitamin (MULTIVITAMIN WITH MINERALS) TABS tablet Take 1 tablet by mouth daily. 30 tablet 0  . multivitamin-lutein (OCUVITE-LUTEIN) CAPS capsule Take 1 capsule by mouth daily. 30 capsule 0  . simvastatin (ZOCOR) 10 MG tablet Take 10 mg by mouth at bedtime.    Marland Kitchen tiotropium (SPIRIVA HANDIHALER) 18 MCG inhalation capsule Place 1 capsule (18 mcg total) into inhaler and inhale daily. 30 capsule 0  . megestrol (MEGACE) 40 MG tablet Take 1 tablet (40 mg total) by mouth daily. (Patient not taking: Reported on 07/09/2018) 30 tablet 0   No current facility-administered medications  on file prior to visit.     There are no Patient Instructions on file for this visit. No follow-ups on file.   Kris Hartmann, NP  This note was completed with Sales executive.  Any errors are purely unintentional.

## 2018-08-27 ENCOUNTER — Other Ambulatory Visit: Payer: Self-pay

## 2018-08-27 ENCOUNTER — Ambulatory Visit (INDEPENDENT_AMBULATORY_CARE_PROVIDER_SITE_OTHER): Payer: Medicare Other | Admitting: Nurse Practitioner

## 2018-08-27 ENCOUNTER — Encounter (INDEPENDENT_AMBULATORY_CARE_PROVIDER_SITE_OTHER): Payer: Self-pay

## 2018-08-27 VITALS — BP 153/73 | HR 73 | Resp 16 | Ht 64.0 in | Wt 122.6 lb

## 2018-08-27 DIAGNOSIS — L97321 Non-pressure chronic ulcer of left ankle limited to breakdown of skin: Secondary | ICD-10-CM | POA: Diagnosis not present

## 2018-08-27 DIAGNOSIS — L97311 Non-pressure chronic ulcer of right ankle limited to breakdown of skin: Secondary | ICD-10-CM | POA: Diagnosis not present

## 2018-08-27 NOTE — Progress Notes (Signed)
History of Present Illness  There is no documented history at this time  Assessments & Plan   There are no diagnoses linked to this encounter.    Additional instructions  Subjective:  Patient presents with venous ulcer of the Bilateral lower extremity.    Procedure:  3 layer unna wrap was placed Bilateral lower extremity.   Plan:   Follow up in one week.  

## 2018-09-03 ENCOUNTER — Other Ambulatory Visit: Payer: Self-pay

## 2018-09-03 ENCOUNTER — Encounter (INDEPENDENT_AMBULATORY_CARE_PROVIDER_SITE_OTHER): Payer: Self-pay

## 2018-09-03 ENCOUNTER — Ambulatory Visit (INDEPENDENT_AMBULATORY_CARE_PROVIDER_SITE_OTHER): Payer: Medicare Other | Admitting: Nurse Practitioner

## 2018-09-03 VITALS — BP 127/65 | HR 64 | Resp 15 | Wt 120.8 lb

## 2018-09-03 DIAGNOSIS — L97311 Non-pressure chronic ulcer of right ankle limited to breakdown of skin: Secondary | ICD-10-CM

## 2018-09-03 DIAGNOSIS — L97321 Non-pressure chronic ulcer of left ankle limited to breakdown of skin: Secondary | ICD-10-CM | POA: Diagnosis not present

## 2018-09-03 NOTE — Progress Notes (Signed)
History of Present Illness  There is no documented history at this time  Assessments & Plan   There are no diagnoses linked to this encounter.    Additional instructions  Subjective:  Patient presents with venous ulcer of the Bilateral lower extremity.    Procedure:  3 layer unna wrap was placed Bilateral lower extremity.   Plan:   Follow up in one week.  

## 2018-09-04 ENCOUNTER — Encounter (INDEPENDENT_AMBULATORY_CARE_PROVIDER_SITE_OTHER): Payer: Self-pay | Admitting: Nurse Practitioner

## 2018-09-10 ENCOUNTER — Ambulatory Visit (INDEPENDENT_AMBULATORY_CARE_PROVIDER_SITE_OTHER): Payer: Medicare Other | Admitting: Nurse Practitioner

## 2018-11-25 ENCOUNTER — Inpatient Hospital Stay
Admission: EM | Admit: 2018-11-25 | Discharge: 2018-11-29 | DRG: 515 | Disposition: A | Discharge status: Skilled Nursing Facility | Payer: Medicare Other | Attending: Internal Medicine | Admitting: Internal Medicine

## 2018-11-25 ENCOUNTER — Emergency Department: Payer: Medicare Other

## 2018-11-25 ENCOUNTER — Observation Stay: Payer: Medicare Other

## 2018-11-25 ENCOUNTER — Other Ambulatory Visit: Payer: Self-pay

## 2018-11-25 ENCOUNTER — Encounter: Payer: Self-pay | Admitting: Emergency Medicine

## 2018-11-25 DIAGNOSIS — R0902 Hypoxemia: Secondary | ICD-10-CM | POA: Diagnosis not present

## 2018-11-25 DIAGNOSIS — I10 Essential (primary) hypertension: Secondary | ICD-10-CM | POA: Diagnosis present

## 2018-11-25 DIAGNOSIS — W19XXXA Unspecified fall, initial encounter: Secondary | ICD-10-CM

## 2018-11-25 DIAGNOSIS — Z90710 Acquired absence of both cervix and uterus: Secondary | ICD-10-CM

## 2018-11-25 DIAGNOSIS — S22080A Wedge compression fracture of T11-T12 vertebra, initial encounter for closed fracture: Secondary | ICD-10-CM | POA: Diagnosis not present

## 2018-11-25 DIAGNOSIS — E876 Hypokalemia: Secondary | ICD-10-CM | POA: Diagnosis present

## 2018-11-25 DIAGNOSIS — I5030 Unspecified diastolic (congestive) heart failure: Secondary | ICD-10-CM

## 2018-11-25 DIAGNOSIS — Z87891 Personal history of nicotine dependence: Secondary | ICD-10-CM

## 2018-11-25 DIAGNOSIS — E119 Type 2 diabetes mellitus without complications: Secondary | ICD-10-CM | POA: Diagnosis present

## 2018-11-25 DIAGNOSIS — Z888 Allergy status to other drugs, medicaments and biological substances status: Secondary | ICD-10-CM

## 2018-11-25 DIAGNOSIS — W010XXA Fall on same level from slipping, tripping and stumbling without subsequent striking against object, initial encounter: Secondary | ICD-10-CM | POA: Diagnosis present

## 2018-11-25 DIAGNOSIS — Z20828 Contact with and (suspected) exposure to other viral communicable diseases: Secondary | ICD-10-CM | POA: Diagnosis present

## 2018-11-25 DIAGNOSIS — J449 Chronic obstructive pulmonary disease, unspecified: Secondary | ICD-10-CM | POA: Diagnosis present

## 2018-11-25 DIAGNOSIS — R600 Localized edema: Secondary | ICD-10-CM

## 2018-11-25 DIAGNOSIS — Z86718 Personal history of other venous thrombosis and embolism: Secondary | ICD-10-CM

## 2018-11-25 DIAGNOSIS — Z91013 Allergy to seafood: Secondary | ICD-10-CM

## 2018-11-25 DIAGNOSIS — Z882 Allergy status to sulfonamides status: Secondary | ICD-10-CM

## 2018-11-25 DIAGNOSIS — Z7982 Long term (current) use of aspirin: Secondary | ICD-10-CM

## 2018-11-25 DIAGNOSIS — Z8249 Family history of ischemic heart disease and other diseases of the circulatory system: Secondary | ICD-10-CM

## 2018-11-25 DIAGNOSIS — F0390 Unspecified dementia without behavioral disturbance: Secondary | ICD-10-CM | POA: Diagnosis present

## 2018-11-25 DIAGNOSIS — Z853 Personal history of malignant neoplasm of breast: Secondary | ICD-10-CM

## 2018-11-25 DIAGNOSIS — Z79899 Other long term (current) drug therapy: Secondary | ICD-10-CM

## 2018-11-25 DIAGNOSIS — J9620 Acute and chronic respiratory failure, unspecified whether with hypoxia or hypercapnia: Secondary | ICD-10-CM | POA: Diagnosis present

## 2018-11-25 DIAGNOSIS — I872 Venous insufficiency (chronic) (peripheral): Secondary | ICD-10-CM | POA: Diagnosis present

## 2018-11-25 DIAGNOSIS — E785 Hyperlipidemia, unspecified: Secondary | ICD-10-CM | POA: Diagnosis present

## 2018-11-25 DIAGNOSIS — Z419 Encounter for procedure for purposes other than remedying health state, unspecified: Secondary | ICD-10-CM

## 2018-11-25 DIAGNOSIS — Z96642 Presence of left artificial hip joint: Secondary | ICD-10-CM | POA: Diagnosis present

## 2018-11-25 DIAGNOSIS — J9621 Acute and chronic respiratory failure with hypoxia: Secondary | ICD-10-CM | POA: Diagnosis present

## 2018-11-25 LAB — CBC WITH DIFFERENTIAL/PLATELET
Abs Immature Granulocytes: 0.12 10*3/uL — ABNORMAL HIGH (ref 0.00–0.07)
Basophils Absolute: 0 10*3/uL (ref 0.0–0.1)
Basophils Relative: 0 %
Eosinophils Absolute: 0 10*3/uL (ref 0.0–0.5)
Eosinophils Relative: 0 %
HCT: 38 % (ref 36.0–46.0)
Hemoglobin: 12 g/dL (ref 12.0–15.0)
Immature Granulocytes: 1 %
Lymphocytes Relative: 8 %
Lymphs Abs: 0.9 10*3/uL (ref 0.7–4.0)
MCH: 27 pg (ref 26.0–34.0)
MCHC: 31.6 g/dL (ref 30.0–36.0)
MCV: 85.4 fL (ref 80.0–100.0)
Monocytes Absolute: 1 10*3/uL (ref 0.1–1.0)
Monocytes Relative: 8 %
Neutro Abs: 9.4 10*3/uL — ABNORMAL HIGH (ref 1.7–7.7)
Neutrophils Relative %: 83 %
Platelets: 146 10*3/uL — ABNORMAL LOW (ref 150–400)
RBC: 4.45 MIL/uL (ref 3.87–5.11)
RDW: 14.1 % (ref 11.5–15.5)
WBC: 11.5 10*3/uL — ABNORMAL HIGH (ref 4.0–10.5)
nRBC: 0 % (ref 0.0–0.2)

## 2018-11-25 LAB — BASIC METABOLIC PANEL
Anion gap: 9 (ref 5–15)
BUN: 11 mg/dL (ref 8–23)
CO2: 35 mmol/L — ABNORMAL HIGH (ref 22–32)
Calcium: 8.3 mg/dL — ABNORMAL LOW (ref 8.9–10.3)
Chloride: 96 mmol/L — ABNORMAL LOW (ref 98–111)
Creatinine, Ser: 0.37 mg/dL — ABNORMAL LOW (ref 0.44–1.00)
GFR calc Af Amer: >60 mL/min (ref >60)
GFR calc non Af Amer: >60 mL/min (ref >60)
Glucose, Bld: 125 mg/dL — ABNORMAL HIGH (ref 70–99)
Potassium: 3 mmol/L — ABNORMAL LOW (ref 3.5–5.1)
Sodium: 140 mmol/L (ref 135–145)

## 2018-11-25 LAB — SARS CORONAVIRUS 2 BY RT PCR (HOSPITAL ORDER, PERFORMED IN ~~LOC~~ HOSPITAL LAB): SARS Coronavirus 2: NEGATIVE

## 2018-11-25 LAB — BRAIN NATRIURETIC PEPTIDE: B Natriuretic Peptide: 366 pg/mL — ABNORMAL HIGH (ref 0.0–100.0)

## 2018-11-25 MED ORDER — ACETAMINOPHEN 325 MG PO TABS
650.0000 mg | ORAL_TABLET | Freq: Four times a day (QID) | ORAL | Status: DC | PRN
  Administered 2018-11-27 – 2018-11-29 (×4): 650 mg via ORAL
  Filled 2018-11-25 (×4): qty 2

## 2018-11-25 MED ORDER — SODIUM CHLORIDE 0.9 % IV SOLN: 250.0000 mL | INTRAVENOUS | Status: DC | PRN

## 2018-11-25 MED ORDER — ACETAMINOPHEN 500 MG PO TABS
500.0000 mg | ORAL_TABLET | Freq: Once | ORAL | Status: AC
  Administered 2018-11-25: 18:00:00 500 mg via ORAL
  Filled 2018-11-25: qty 1

## 2018-11-25 MED ORDER — ADULT MULTIVITAMIN W/MINERALS CH
1.0000 | ORAL_TABLET | Freq: Every day | ORAL | Status: DC
  Administered 2018-11-26 – 2018-11-29 (×4): 1 via ORAL
  Filled 2018-11-25 (×4): qty 1

## 2018-11-25 MED ORDER — ENOXAPARIN SODIUM 40 MG/0.4ML ~~LOC~~ SOLN
40.0000 mg | SUBCUTANEOUS | Status: DC
  Administered 2018-11-26: 03:00:00 40 mg via SUBCUTANEOUS
  Filled 2018-11-25: qty 0.4

## 2018-11-25 MED ORDER — CARVEDILOL 3.125 MG PO TABS: 3.1250 mg | ORAL_TABLET | Freq: Two times a day (BID) | ORAL | Status: DC

## 2018-11-25 MED ORDER — SODIUM CHLORIDE 0.9% FLUSH: 3.0000 mL | Freq: Two times a day (BID) | INTRAVENOUS | Status: DC

## 2018-11-25 MED ORDER — DIVALPROEX SODIUM 250 MG PO DR TAB
250.0000 mg | DELAYED_RELEASE_TABLET | Freq: Two times a day (BID) | ORAL | Status: DC
  Filled 2018-11-25: qty 1

## 2018-11-25 MED ORDER — ACETAMINOPHEN 650 MG RE SUPP: 650.0000 mg | Freq: Four times a day (QID) | RECTAL | Status: DC | PRN

## 2018-11-25 MED ORDER — POTASSIUM CHLORIDE 20 MEQ PO PACK
40.0000 meq | PACK | Freq: Once | ORAL | Status: AC
  Administered 2018-11-25: 40 meq via ORAL
  Filled 2018-11-25: qty 2

## 2018-11-25 MED ORDER — TRAMADOL HCL 50 MG PO TABS
50.0000 mg | ORAL_TABLET | Freq: Once | ORAL | Status: AC
  Administered 2018-11-25: 50 mg via ORAL
  Filled 2018-11-25: qty 1

## 2018-11-25 MED ORDER — LIDOCAINE 5 % EX PTCH
1.0000 | MEDICATED_PATCH | CUTANEOUS | Status: DC
  Administered 2018-11-25 – 2018-11-28 (×4): 1 via TRANSDERMAL
  Filled 2018-11-25 (×6): qty 1

## 2018-11-25 MED ORDER — ASPIRIN EC 81 MG PO TBEC
81.0000 mg | DELAYED_RELEASE_TABLET | Freq: Every day | ORAL | Status: DC
  Administered 2018-11-26 – 2018-11-29 (×4): 81 mg via ORAL
  Filled 2018-11-25 (×4): qty 1

## 2018-11-25 MED ORDER — POTASSIUM CHLORIDE CRYS ER 20 MEQ PO TBCR
40.0000 meq | EXTENDED_RELEASE_TABLET | Freq: Once | ORAL | Status: DC
  Filled 2018-11-25 (×2): qty 2

## 2018-11-25 MED ORDER — SIMVASTATIN 20 MG PO TABS
10.0000 mg | ORAL_TABLET | Freq: Every day | ORAL | Status: DC
  Administered 2018-11-26 – 2018-11-28 (×3): 10 mg via ORAL
  Filled 2018-11-25 (×3): qty 1

## 2018-11-25 MED ORDER — FUROSEMIDE 40 MG PO TABS
40.0000 mg | ORAL_TABLET | Freq: Every day | ORAL | Status: DC
  Administered 2018-11-26: 09:00:00 40 mg via ORAL
  Filled 2018-11-25: qty 1

## 2018-11-25 MED ORDER — ONDANSETRON HCL 4 MG/2ML IJ SOLN: 4.0000 mg | Freq: Four times a day (QID) | INTRAMUSCULAR | Status: DC | PRN

## 2018-11-25 MED ORDER — SODIUM CHLORIDE 0.9% FLUSH: 3.0000 mL | INTRAVENOUS | Status: DC | PRN

## 2018-11-25 MED ORDER — HYDRALAZINE HCL 50 MG PO TABS: 100.0000 mg | ORAL_TABLET | Freq: Two times a day (BID) | ORAL | Status: DC

## 2018-11-25 MED ORDER — SODIUM CHLORIDE 0.9% FLUSH
3.0000 mL | Freq: Two times a day (BID) | INTRAVENOUS | Status: DC
  Administered 2018-11-26 – 2018-11-29 (×8): 3 mL via INTRAVENOUS

## 2018-11-25 MED ORDER — ONDANSETRON HCL 4 MG PO TABS: 4.0000 mg | ORAL_TABLET | Freq: Four times a day (QID) | ORAL | Status: DC | PRN

## 2018-11-25 MED ORDER — AMLODIPINE BESYLATE 10 MG PO TABS
10.0000 mg | ORAL_TABLET | Freq: Every day | ORAL | Status: DC
  Administered 2018-11-26 – 2018-11-29 (×4): 10 mg via ORAL
  Filled 2018-11-25 (×5): qty 1

## 2018-11-25 MED ORDER — CLONIDINE HCL 0.1 MG PO TABS: 0.1000 mg | ORAL_TABLET | Freq: Two times a day (BID) | ORAL | Status: DC

## 2018-11-25 NOTE — ED Provider Notes (Signed)
[facility] Emergency Department Provider Note  ____________________________________________  Time seen: Approximately 8:49 PM  I have reviewed the triage vital signs and the nursing notes.   HISTORY  Chief Complaint Fall    HPI Lindsey English is a 85 y.o. female presents to the emergency department for evaluation after fall today.  Patient was carrying a pineapple casserole and encouraged her chin when she tripped and fell.  She has pain to her entire back.   After son got to the emergency department, he states that patient's oxygen saturation has been 90 when the nurse does home visits.  He has been meaning to talk to her doctor about this but has not yet.  Patient does have chronic shortness of breath "her whole life" with asthma.  He has not noticed any new shortness of breath.  She is not on home oxygen.   Past Medical History:  Diagnosis Date  . Asthma   . Cancer ([place])    Right  . Diabetes mellitus without complication ([place])   . DVT (deep venous thrombosis) ([place])   . Hiatal hernia   . Hyperlipemia   . Hypertension   . Hyperthyroidism     Patient Active Problem List   Diagnosis Date Noted  . Chronic venous insufficiency 06/18/2018  . Hyperlipemia 06/18/2018  . Essential hypertension 06/18/2018  . Malnutrition of moderate degree 06/01/2018  . Failure to thrive (0-17) 05/30/2018  . Dementia ([place]) 11/09/2016  . Lymphedema 04/20/2016  . Ulcer of ankle, left, limited to breakdown of skin ([place]) 04/20/2016  . Ulcer of ankle, right, limited to breakdown of skin ([place]) 04/20/2016  . Cellulitis 02/29/2016    Past Surgical History:  Procedure Laterality Date  . ABDOMINAL HYSTERECTOMY    . BREAST SURGERY Right   . TOTAL HIP ARTHROPLASTY Left     Prior to Admission medications   Medication Sig Start Date End Date Taking? Authorizing Provider  amLODipine (NORVASC) 10 MG tablet Take 1 tablet (10 mg total) by mouth daily. 06/01/18  Yes Epifanio Lesches, MD  aspirin EC 81 MG tablet Take 81 mg by mouth daily.   Yes [provider]  carvedilol (COREG) 3.125 MG tablet Take 1 tablet (3.125 mg total) by mouth 2 (two) times daily. 06/01/18 06/01/19 Yes Epifanio Lesches, MD  cloNIDine (CATAPRES) 0.1 MG tablet Take 0.1 mg by mouth 2 (two) times daily.   Yes [provider]  divalproex (DEPAKOTE) 250 MG DR tablet Take 250 mg by mouth 2 (two) times a day. 08/24/18  Yes [provider]  furosemide (LASIX) 40 MG tablet Take 40 mg by mouth daily. 06/27/18  Yes [provider]  hydrALAZINE (APRESOLINE) 100 MG tablet Take 1 tablet (100 mg total) by mouth 3 (three) times daily. Patient taking differently: Take 100 mg by mouth 2 (two) times daily.  06/01/18 06/01/19 Yes Epifanio Lesches, MD  Multiple Vitamin (MULTIVITAMIN WITH MINERALS) TABS tablet Take 1 tablet by mouth daily. 06/01/18  Yes Epifanio Lesches, MD  potassium chloride (MICRO-K) 10 MEQ CR capsule Take 10 mEq by mouth 2 (two) times a day.   Yes [provider]  simvastatin (ZOCOR) 10 MG tablet Take 10 mg by mouth at bedtime.   Yes [provider]    Allergies Shrimp [shellfish allergy], Sulfa antibiotics, Clindamycin hcl, Keflex [cephalexin], and Vancomycin  Family History  Problem Relation Age of Onset  . Hypertension Mother     Social History Social History   Tobacco Use  . Smoking  status: Former Research scientist (life sciences)  . Smokeless tobacco: Never Used  Substance Use Topics  . Alcohol use: No  . Drug use: Never     Review of Systems  Constitutional: No fever/chills ENT: No upper respiratory complaints. Cardiovascular: No chest pain. Respiratory: No cough. No SOB. Gastrointestinal: No abdominal pain.  No nausea, no vomiting.  Musculoskeletal: Negative for musculoskeletal pain. Skin: Negative for rash, abrasions, lacerations, ecchymosis. Neurological: Negative for headaches, numbness or  tingling   ____________________________________________   PHYSICAL EXAM:  VITAL SIGNS: ED Triage Vitals  Enc Vitals Group     BP 11/25/18 1624 136/62     Pulse Rate 11/25/18 1624 80     Resp 11/25/18 1624 18     Temp 11/25/18 1624 98.7 F (37.1 C)     Temp Source 11/25/18 1624 Oral     SpO2 11/25/18 1624 98 %     Weight 11/25/18 1625 120 lb 13 oz (54.8 kg)     Height 11/25/18 1625 5\' 2"  (1.575 m)     Head Circumference --      Peak Flow --      Pain Score --      Pain Loc --      Pain Edu? --      Excl. in [place]? --      Constitutional: Alert and oriented. Well appearing and in no acute distress. Eyes: Conjunctivae are normal. PERRL. EOMI. Head: Atraumatic. ENT:      Ears:      Nose: No congestion/rhinnorhea.      Mouth/Throat: Mucous membranes are moist.  Neck: No stridor.  No cervical spine tenderness to palpation. Cardiovascular: Normal rate, regular rhythm.  Good peripheral circulation. Respiratory: Normal respiratory effort without tachypnea or retractions. Lungs CTAB. Good air entry to the bases with no decreased or absent breath sounds. Gastrointestinal: Bowel sounds 4 quadrants. Soft and nontender to palpation. No guarding or rigidity. No palpable masses. No distention.  Musculoskeletal: Full range of motion to all extremities. No gross deformities appreciated.  Mild diffuse tenderness to palpation throughout back.  No specific pinpoint tenderness to palpation. Neurologic:  Normal speech and language. No gross focal neurologic deficits are appreciated.  Skin:  Skin is warm, dry and intact. No rash noted. Psychiatric: Mood and affect are normal. Speech and behavior are normal. Patient exhibits appropriate insight and judgement.   ____________________________________________   LABS (all labs ordered are listed, but only abnormal results are displayed)  Labs Reviewed  CBC WITH DIFFERENTIAL/PLATELET - Abnormal; Notable for the following components:      Result  Value   WBC 11.5 (*)    Platelets 146 (*)    Neutro Abs 9.4 (*)    Abs Immature Granulocytes 0.12 (*)    All other components within normal limits  BASIC METABOLIC PANEL - Abnormal; Notable for the following components:   Potassium 3.0 (*)    Chloride 96 (*)    CO2 35 (*)    Glucose, Bld 125 (*)    Creatinine, Ser 0.37 (*)    Calcium 8.3 (*)    All other components within normal limits  BRAIN NATRIURETIC PEPTIDE - Abnormal; Notable for the following components:   B Natriuretic Peptide 366.0 (*)    All other components within normal limits  SARS CORONAVIRUS 2 (HOSPITAL ORDER, [place] [facility])   ____________________________________________  EKG   ____________________________________________  RADIOLOGY Robinette Haines, personally viewed and evaluated these images (plain radiographs) as part of my medical decision making,  as well as reviewing the written report by the radiologist.  Dg Ribs Unilateral W/chest Right  Result Date: 11/25/2018 CLINICAL DATA:  [place].  Right sided chest pain. EXAM: RIGHT RIBS AND CHEST - 3+ VIEW COMPARISON:  None. FINDINGS: The cardiac silhouette, mediastinal and hilar contours are within normal limits. Moderate tortuosity and calcification of the thoracic aorta. The lungs are clear. No pleural effusion or pneumothorax. No worrisome pulmonary lesions. Dedicated views of the right ribs do not demonstrate any definite acute right rib fractures. No pleural thickening or pleural fluid. IMPRESSION: 1. No acute cardiopulmonary findings. 2. No definite acute right-sided rib fractures. Electronically Signed   By: Marijo Sanes M.D.   On: 11/25/2018 17:46   Dg Lumbar Spine 2-3 Views  Result Date: 11/25/2018 CLINICAL DATA:  [place].  Back pain. EXAM: LUMBAR SPINE - 2-3 VIEW COMPARISON:  04/12/2012 FINDINGS: Normal alignment of the lumbar vertebral bodies. Disc spaces and vertebral bodies are maintained. No acute fractures identified. The facets are  grossly normally aligned. Extensive vascular calcifications are noted. IMPRESSION: No acute lumbar spine fracture. Electronically Signed   By: Marijo Sanes M.D.   On: 11/25/2018 17:47   Ct Head Wo Contrast  Result Date: 11/25/2018 CLINICAL DATA:  Fall.  Pain. EXAM: CT HEAD WITHOUT CONTRAST CT CERVICAL SPINE WITHOUT CONTRAST TECHNIQUE: Multidetector CT imaging of the head and cervical spine was performed following the standard protocol without intravenous contrast. Multiplanar CT image reconstructions of the cervical spine were also generated. COMPARISON:  None. FINDINGS: CT HEAD FINDINGS Brain: There is a lacunar infarct in the right cerebellar hemisphere on axial image 5. Another small lacunar infarct is seen in the right cerebellar hemisphere on image 10. Cerebellum, brainstem, and basal cisterns are otherwise normal. Ventricles and sulci are prominent but stable. Chronic white matter changes are identified. No acute cortical ischemia or infarct. No mass effect or midline shift. No subdural, epidural, or subarachnoid hemorrhage. Vascular: No hyperdense vessel or unexpected calcification. Skull: Normal. Negative for fracture or focal lesion. Sinuses/Orbits: No acute finding. Other: None. CT CERVICAL SPINE FINDINGS Alignment: Normal. Skull base and vertebrae: No acute fracture. No primary bone lesion or focal pathologic process. Soft tissues and spinal canal: No prevertebral fluid or swelling. No visible canal hematoma. Disc levels:  Multilevel degenerative changes. Upper chest: Negative. Other: No other abnormalities. IMPRESSION: 1. Chronic white matter changes and lacunar infarcts. No acute intracranial abnormalities identified. 2. No fracture or traumatic malalignment in the cervical spine. Electronically Signed   By: Dorise Bullion III M.D   On: 11/25/2018 18:01   Ct Cervical Spine Wo Contrast  Result Date: 11/25/2018 CLINICAL DATA:  Fall.  Pain. EXAM: CT HEAD WITHOUT CONTRAST CT CERVICAL SPINE WITHOUT  CONTRAST TECHNIQUE: Multidetector CT imaging of the head and cervical spine was performed following the standard protocol without intravenous contrast. Multiplanar CT image reconstructions of the cervical spine were also generated. COMPARISON:  None. FINDINGS: CT HEAD FINDINGS Brain: There is a lacunar infarct in the right cerebellar hemisphere on axial image 5. Another small lacunar infarct is seen in the right cerebellar hemisphere on image 10. Cerebellum, brainstem, and basal cisterns are otherwise normal. Ventricles and sulci are prominent but stable. Chronic white matter changes are identified. No acute cortical ischemia or infarct. No mass effect or midline shift. No subdural, epidural, or subarachnoid hemorrhage. Vascular: No hyperdense vessel or unexpected calcification. Skull: Normal. Negative for fracture or focal lesion. Sinuses/Orbits: No acute finding. Other: None. CT CERVICAL SPINE FINDINGS Alignment: Normal. Skull base  and vertebrae: No acute fracture. No primary bone lesion or focal pathologic process. Soft tissues and spinal canal: No prevertebral fluid or swelling. No visible canal hematoma. Disc levels:  Multilevel degenerative changes. Upper chest: Negative. Other: No other abnormalities. IMPRESSION: 1. Chronic white matter changes and lacunar infarcts. No acute intracranial abnormalities identified. 2. No fracture or traumatic malalignment in the cervical spine. Electronically Signed   By: Dorise Bullion III M.D   On: 11/25/2018 18:01   Dg Hip Unilat W Or Wo Pelvis 2-3 Views Right  Result Date: 11/25/2018 CLINICAL DATA:  [place].  Right hip pain. EXAM: DG HIP (WITH OR WITHOUT PELVIS) 2-3V RIGHT COMPARISON:  None. FINDINGS: The left total hip arthroplasty is intact. The right hip demonstrates advanced degenerative changes with joint space narrowing and spurring but no acute fracture is identified. No evidence of AVN. The pubic symphysis and SI joints are intact. No pelvic fractures or bone  lesions. IMPRESSION: 1. Intact left total hip arthroplasty. 2. Advanced right hip joint degenerative changes but no definite acute hip fracture. 3. No definite pelvic fractures. Electronically Signed   By: Marijo Sanes M.D.   On: 11/25/2018 17:40    ____________________________________________    PROCEDURES  Procedure(s) performed:    Procedures    Medications  lidocaine (LIDODERM) 5 % 1 patch (1 patch Transdermal Patch Applied 11/25/18 1749)  potassium chloride (KLOR-CON) packet 40 mEq (has no administration in time range)  acetaminophen (TYLENOL) tablet 500 mg (500 mg Oral Given 11/25/18 1749)  traMADol (ULTRAM) tablet 50 mg (50 mg Oral Given 11/25/18 2044)     ____________________________________________   INITIAL IMPRESSION / ASSESSMENT AND PLAN / ED COURSE  Pertinent labs & imaging results that were available during my care of the patient were reviewed by me and considered in my medical decision making (see chart for details).  Review of the  CSRS was performed in accordance of the [place] prior to dispensing any controlled drugs.     Patient presents emergency department for evaluation after fall.  X-rays are reassuring.  Son also states that patient's oxygen has only been 90% at home when there are nurse home visits.  Patient's oxygen was 88% without oxygen while in the emergency department.  Patient will need to be admitted for oxygen and set up with home oxygen.  Case was discussed with Dr. Benetta Spar who is agreeable for admission.  Bloodwork pending.    Lindsey English was evaluated in Emergency Department on 11/25/2018 for the symptoms described in the history of present illness. She was evaluated in the context of the global COVID-19 pandemic, which necessitated consideration that the patient might be at risk for infection with the SARS-CoV-2 virus that causes COVID-19. Institutional protocols and algorithms that pertain to the evaluation of patients at risk for COVID-19  are in a state of rapid change based on information released by regulatory bodies including the CDC and federal and state organizations. These policies and algorithms were followed during the patient's care in the ED. ____________________________________________  FINAL CLINICAL IMPRESSION(S) / ED DIAGNOSES  Final diagnoses:  Fall, initial encounter  Hypoxia      NEW MEDICATIONS STARTED DURING THIS VISIT:  ED Discharge Orders    None          This chart was dictated using voice recognition software/Dragon. Despite best efforts to proofread, errors can occur which can change the meaning. Any change was purely unintentional.    Laban Emperor, PA-C 11/25/18 2144    [place],  Gerda Diss, MD 11/25/18 [phone]

## 2018-11-25 NOTE — ED Notes (Signed)
ED TO INPATIENT HANDOFF REPORT  ED Nurse Name and Phone #: Terri Piedra [phone]  S Name/Age/Gender Lindsey English 85 y.o. female Room/Bed: ED14A/ED14A  Code Status   Code Status: Full Code  Home/SNF/Other Home Patient oriented to: self Is this baseline? Yes   Triage Complete: Triage complete  Chief Complaint Back pain  Triage Note Pt was reaching for something and fell backwards. C/o bilateral shoulder pain and back pain   Allergies Allergies  Allergen Reactions  . Shrimp [Shellfish Allergy]   . Sulfa Antibiotics Nausea Only  . Clindamycin Hcl Rash  . Keflex [Cephalexin] Rash  . 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 Asthma   . Cancer ([place])    Right  . Diabetes mellitus without complication ([place])   . DVT (deep venous thrombosis) ([place])   . Hiatal hernia   . Hyperlipemia   . Hypertension   . Hyperthyroidism    Past Surgical History:  Procedure Laterality Date  . ABDOMINAL HYSTERECTOMY    . BREAST SURGERY Right   . TOTAL HIP ARTHROPLASTY Left      A IV Location/Drains/Wounds Patient Lines/Drains/Airways Status   Active Line/Drains/Airways    Name:   Placement date:   Placement time:   Site:   Days:   Peripheral IV 11/25/18 Right Forearm   11/25/18    2129    Forearm   less than 1   External Urinary Catheter    11/25/18    2059    -   less than 1          Intake/Output Last 24 hours No intake or output data in the 24 hours ending 11/25/18 2346  Labs/Imaging Results for orders placed or performed during the hospital encounter of 11/25/18 (from the past 48 hour(s))  CBC with Differential     Status: Abnormal   Collection Time: 11/25/18  8:46 PM  Result Value Ref Range   WBC 11.5 (H) 4.0 - 10.5 K/uL   RBC 4.45 3.87 - 5.11 MIL/uL   Hemoglobin 12.0 12.0 - 15.0 g/dL   HCT 38.0 36.0 - 46.0 %   MCV 85.4 80.0 - 100.0 fL   MCH 27.0 26.0 - 34.0 pg   MCHC 31.6 30.0 - 36.0 g/dL   RDW 14.1 11.5 - 15.5 %   Platelets 146 (L) 150 - 400 K/uL   nRBC 0.0 0.0 - 0.2 %   Neutrophils Relative % 83 %   Neutro Abs 9.4 (H) 1.7 - 7.7 K/uL   Lymphocytes Relative 8 %   Lymphs Abs 0.9 0.7 - 4.0 K/uL   Monocytes Relative 8 %   Monocytes Absolute 1.0 0.1 - 1.0 K/uL   Eosinophils Relative 0 %   Eosinophils Absolute 0.0 0.0 - 0.5 K/uL   Basophils Relative 0 %   Basophils Absolute 0.0 0.0 - 0.1 K/uL   Immature Granulocytes 1 %  Abs Immature Granulocytes 0.12 (H) 0.00 - 0.07 K/uL    Comment: Performed at [facility], [address] [place], [place] [address]  Basic metabolic panel     Status: Abnormal   Collection Time: 11/25/18  8:46 PM  Result Value Ref Range   Sodium 140 135 - 145 mmol/L   Potassium 3.0 (L) 3.5 - 5.1 mmol/L   Chloride 96 (L) 98 - 111 mmol/L   CO2 35 (H) 22 - 32 mmol/L   Glucose, Bld 125 (H) 70 - 99 mg/dL   BUN 11 8 - 23 mg/dL   Creatinine, Ser 0.37 (L) 0.44 - 1.00 mg/dL   Calcium 8.3 (L) 8.9 - 10.3 mg/dL   GFR calc non Af Amer >60 >60 mL/min   GFR calc Af Amer >60 >60 mL/min   Anion gap 9 5 - 15    Comment: Performed at [facility], [address], [place], [place] [address]  Brain natriuretic peptide     Status: Abnormal   Collection Time: 11/25/18  8:46 PM  Result Value Ref Range   B Natriuretic Peptide 366.0 (H) 0.0 - 100.0 pg/mL    Comment: Performed at  [facility], [address] [place], [place] [address]  SARS Coronavirus 2 (CEPHEID- Performed in [place] hospital lab), Hosp Order     Status: None   Collection Time: 11/25/18  9:19 PM   Specimen: Nasopharyngeal Swab  Result Value Ref Range   SARS Coronavirus 2 NEGATIVE NEGATIVE    Comment: (NOTE) If result is NEGATIVE SARS-CoV-2 target nucleic acids are NOT DETECTED. The SARS-CoV-2 RNA is generally detectable in upper and lower  respiratory specimens during the acute phase of infection. The lowest  concentration of SARS-CoV-2 viral copies this assay can detect is 250  copies / mL. A negative result does not preclude SARS-CoV-2 infection  and should not be used as the sole basis for treatment or other  patient management decisions.  A negative result may occur with  improper specimen collection / handling, submission of specimen other  than nasopharyngeal swab, presence of viral mutation(s) within the  areas targeted by this assay, and inadequate number of viral copies  (<250 copies / mL). A negative result must be combined with clinical  observations, patient history, and epidemiological information. If result is POSITIVE SARS-CoV-2 target nucleic acids are DETECTED. The SARS-CoV-2 RNA is generally detectable in upper and lower  respiratory specimens dur ing the acute phase of infection.  Positive  results are indicative of active infection with SARS-CoV-2.  Clinical  correlation with patient history and other diagnostic information is  necessary to determine patient infection status.  Positive results do  not rule out bacterial infection or co-infection with other viruses. If result is PRESUMPTIVE POSTIVE SARS-CoV-2 nucleic acids MAY BE PRESENT.   A presumptive positive result was obtained on the submitted specimen  and confirmed on repeat testing.  While 2019 novel coronavirus  (SARS-CoV-2) nucleic acids may be present in the submitted sample  additional  confirmatory testing may be necessary for epidemiological  and / or clinical management purposes  to differentiate between  SARS-CoV-2 and other Sarbecovirus currently known to infect humans.  If clinically indicated additional testing with an alternate test  methodology [phone]) is advised. The SARS-CoV-2 RNA is generally  detectable in upper and lower respiratory sp ecimens during the acute  phase of infection. The expected result is Negative. Fact Sheet for Patients:  StrictlyIdeas.no Fact Sheet for Healthcare Providers: BankingDealers.co.za  This test is not yet approved or cleared by the [place] and has been authorized for detection and/or diagnosis of SARS-CoV-2 by FDA under an Emergency Use Authorization (EUA).  This EUA will remain in effect (meaning this test can be used) for the duration of the COVID-19 declaration under Section 564(b)(1) of the Act, 21 U.S.C. section 360bbb-3(b)(1), unless the authorization is terminated or revoked sooner. Performed at [facility], [address] [place], [place] [address]    Dg Ribs Unilateral W/chest Right  Result Date: 11/25/2018 CLINICAL DATA:  [place].  Right sided chest pain. EXAM: RIGHT RIBS AND CHEST - 3+ VIEW COMPARISON:  None. FINDINGS: The cardiac silhouette, mediastinal and hilar contours are within normal limits. Moderate tortuosity and calcification of the thoracic aorta. The lungs are clear. No pleural effusion or pneumothorax. No worrisome pulmonary lesions. Dedicated views of the right ribs do not demonstrate any definite acute right rib fractures. No pleural thickening or pleural fluid. IMPRESSION: 1. No acute cardiopulmonary findings. 2. No definite acute right-sided rib fractures. Electronically Signed   By: Marijo Sanes M.D.   On: 11/25/2018 17:46   Dg Lumbar Spine 2-3 Views  Result Date: 11/25/2018 CLINICAL DATA:  [place].  Back pain. EXAM: LUMBAR SPINE - 2-3  VIEW COMPARISON:  04/12/2012 FINDINGS: Normal alignment of the lumbar vertebral bodies. Disc spaces and vertebral bodies are maintained. No acute fractures identified. The facets are grossly normally aligned. Extensive vascular calcifications are noted. IMPRESSION: No acute lumbar spine fracture. Electronically Signed   By: Marijo Sanes M.D.   On: 11/25/2018 17:47   Ct Head Wo Contrast  Result Date: 11/25/2018 CLINICAL DATA:  [place].  Pain. EXAM: CT HEAD WITHOUT CONTRAST CT CERVICAL SPINE WITHOUT CONTRAST TECHNIQUE: Multidetector CT imaging of the head and cervical spine was performed following the standard protocol without intravenous contrast. Multiplanar CT image reconstructions of the cervical spine were also generated. COMPARISON:  None. FINDINGS: CT HEAD FINDINGS Brain: There is a lacunar infarct in the right cerebellar hemisphere on axial image 5. Another small lacunar infarct is seen in the right cerebellar hemisphere on image 10. Cerebellum, brainstem, and basal cisterns are otherwise normal. Ventricles and sulci are prominent but stable. Chronic white matter changes are identified. No acute cortical ischemia or infarct. No mass effect or midline shift. No subdural, epidural, or subarachnoid hemorrhage. Vascular: No hyperdense vessel or unexpected calcification. Skull: Normal. Negative for fracture or focal lesion. Sinuses/Orbits: No acute finding. Other: None. CT CERVICAL SPINE FINDINGS Alignment: Normal. Skull base and vertebrae: No acute fracture. No primary bone lesion or focal pathologic process. Soft tissues and spinal canal: No prevertebral fluid or swelling. No visible canal hematoma. Disc levels:  Multilevel degenerative changes. Upper chest: Negative. Other: No other abnormalities. IMPRESSION: 1. Chronic white matter changes and lacunar infarcts. No acute intracranial abnormalities identified. 2. No fracture or traumatic malalignment in the cervical spine. Electronically Signed   By: Dorise Bullion III M.D   On: 11/25/2018 18:01   Ct Cervical Spine Wo Contrast  Result Date: 11/25/2018 CLINICAL DATA:  [place].  Pain. EXAM: CT HEAD WITHOUT CONTRAST CT CERVICAL SPINE WITHOUT CONTRAST TECHNIQUE: Multidetector CT imaging of the head and cervical spine was performed following the standard protocol without intravenous contrast. Multiplanar CT image reconstructions of the cervical spine were also generated. COMPARISON:  None. FINDINGS: CT HEAD FINDINGS Brain: There is a lacunar infarct in the right cerebellar hemisphere on axial image 5. Another small lacunar infarct is seen in the right  cerebellar hemisphere on image 10. Cerebellum, brainstem, and basal cisterns are otherwise normal. Ventricles and sulci are prominent but stable. Chronic white matter changes are identified. No acute cortical ischemia or infarct. No mass effect or midline shift. No subdural, epidural, or subarachnoid hemorrhage. Vascular: No hyperdense vessel or unexpected calcification. Skull: Normal. Negative for fracture or focal lesion. Sinuses/Orbits: No acute finding. Other: None. CT CERVICAL SPINE FINDINGS Alignment: Normal. Skull base and vertebrae: No acute fracture. No primary bone lesion or focal pathologic process. Soft tissues and spinal canal: No prevertebral fluid or swelling. No visible canal hematoma. Disc levels:  Multilevel degenerative changes. Upper chest: Negative. Other: No other abnormalities. IMPRESSION: 1. Chronic white matter changes and lacunar infarcts. No acute intracranial abnormalities identified. 2. No fracture or traumatic malalignment in the cervical spine. Electronically Signed   By: Dorise Bullion III M.D   On: 11/25/2018 18:01   Dg Hip Unilat W Or Wo Pelvis 2-3 Views Right  Result Date: 11/25/2018 CLINICAL DATA:  [place].  Right hip pain. EXAM: DG HIP (WITH OR WITHOUT PELVIS) 2-3V RIGHT COMPARISON:  None. FINDINGS: The left total hip arthroplasty is intact. The right hip demonstrates advanced  degenerative changes with joint space narrowing and spurring but no acute fracture is identified. No evidence of AVN. The pubic symphysis and SI joints are intact. No pelvic fractures or bone lesions. IMPRESSION: 1. Intact left total hip arthroplasty. 2. Advanced right hip joint degenerative changes but no definite acute hip fracture. 3. No definite pelvic fractures. Electronically Signed   By: Marijo Sanes M.D.   On: 11/25/2018 17:40    Pending Labs Unresulted Labs (From admission, onward)    Start     Ordered   12/02/18 0500  Creatinine, serum  (enoxaparin (LOVENOX)    CrCl >/= 30 ml/min)  Weekly,   STAT    Comments: while on enoxaparin therapy    11/25/18 2310   11/26/18 7824  Basic metabolic panel  Tomorrow morning,   STAT     11/25/18 2310   11/26/18 0500  CBC  Tomorrow morning,   STAT     11/25/18 2310   11/25/18 2310  Brain natriuretic peptide  Once,   STAT     11/25/18 2310   11/25/18 2310  CBC  (enoxaparin (LOVENOX)    CrCl >/= 30 ml/min)  Once,   STAT    Comments: Baseline for enoxaparin therapy IF NOT ALREADY DRAWN.  Notify MD if PLT < 100 K.    11/25/18 2310   11/25/18 2310  Creatinine, serum  (enoxaparin (LOVENOX)    CrCl >/= 30 ml/min)  Once,   STAT    Comments: Baseline for enoxaparin therapy IF NOT ALREADY DRAWN.    11/25/18 2310          Vitals/Pain Today's Vitals   11/25/18 1700 11/25/18 1800 11/25/18 1830 11/25/18 1900  BP: 118/63 133/62 (!) 149/62 140/64  Pulse: 75 82    Resp: (!) 24 (!) 22 19 17   Temp:      TempSrc:      SpO2: 99% 99%    Weight:      Height:        Isolation Precautions No active isolations  Medications Medications  lidocaine (LIDODERM) 5 % 1 patch (1 patch Transdermal Patch Applied 11/25/18 1749)  amLODipine (NORVASC) tablet 10 mg (has no administration in time range)  carvedilol (COREG) tablet 3.125 mg (has no administration in time range)  hydrALAZINE (APRESOLINE) tablet 100 mg (has no administration  in time range)   multivitamin with minerals tablet 1 tablet (has no administration in time range)  aspirin EC tablet 81 mg (has no administration in time range)  cloNIDine (CATAPRES) tablet 0.1 mg (has no administration in time range)  divalproex (DEPAKOTE) DR tablet 250 mg (has no administration in time range)  furosemide (LASIX) tablet 40 mg (has no administration in time range)  potassium chloride SA (K-DUR) CR tablet 40 mEq (has no administration in time range)  simvastatin (ZOCOR) tablet 10 mg (has no administration in time range)  enoxaparin (LOVENOX) injection 40 mg (has no administration in time range)  sodium chloride flush (NS) 0.9 % injection 3 mL (has no administration in time range)  acetaminophen (TYLENOL) tablet 650 mg (has no administration in time range)    Or  acetaminophen (TYLENOL) suppository 650 mg (has no administration in time range)  ondansetron (ZOFRAN) tablet 4 mg (has no administration in time range)    Or  ondansetron (ZOFRAN) injection 4 mg (has no administration in time range)  acetaminophen (TYLENOL) tablet 500 mg (500 mg Oral Given 11/25/18 1749)  traMADol (ULTRAM) tablet 50 mg (50 mg Oral Given 11/25/18 2044)  potassium chloride (KLOR-CON) packet 40 mEq (40 mEq Oral Given 11/25/18 2204)    Mobility walks with device Moderate fall risk   Focused Assessments Pulmonary Assessment Handoff:  Lung sounds:            R Recommendations: See Admitting Provider Note  Report given to:   Additional Notes:

## 2018-11-25 NOTE — ED Notes (Signed)
Son with pt at bedside

## 2018-11-25 NOTE — ED Notes (Signed)
Pt room air sats 88%, pt on 2L

## 2018-11-25 NOTE — ED Triage Notes (Signed)
Pt was reaching for something and fell backwards. C/o bilateral shoulder pain and back pain

## 2018-11-25 NOTE — ED Notes (Signed)
PA Christopher Hink made aware pt requesting pain medication prior to d/c.

## 2018-11-26 ENCOUNTER — Observation Stay: Payer: Medicare Other

## 2018-11-26 ENCOUNTER — Inpatient Hospital Stay
Admit: 2018-11-26 | Discharge: 2018-11-26 | Disposition: A | Discharge status: Home / Self Care | Payer: Medicare Other | Attending: Nurse Practitioner | Admitting: Nurse Practitioner

## 2018-11-26 ENCOUNTER — Inpatient Hospital Stay: Payer: Medicare Other

## 2018-11-26 DIAGNOSIS — Z20828 Contact with and (suspected) exposure to other viral communicable diseases: Secondary | ICD-10-CM | POA: Diagnosis present

## 2018-11-26 DIAGNOSIS — E119 Type 2 diabetes mellitus without complications: Secondary | ICD-10-CM | POA: Diagnosis present

## 2018-11-26 DIAGNOSIS — I10 Essential (primary) hypertension: Secondary | ICD-10-CM | POA: Diagnosis present

## 2018-11-26 DIAGNOSIS — W010XXA Fall on same level from slipping, tripping and stumbling without subsequent striking against object, initial encounter: Secondary | ICD-10-CM | POA: Diagnosis present

## 2018-11-26 DIAGNOSIS — Z79899 Other long term (current) drug therapy: Secondary | ICD-10-CM | POA: Diagnosis not present

## 2018-11-26 DIAGNOSIS — R0902 Hypoxemia: Secondary | ICD-10-CM | POA: Diagnosis present

## 2018-11-26 DIAGNOSIS — E876 Hypokalemia: Secondary | ICD-10-CM | POA: Diagnosis present

## 2018-11-26 DIAGNOSIS — Z87891 Personal history of nicotine dependence: Secondary | ICD-10-CM | POA: Diagnosis not present

## 2018-11-26 DIAGNOSIS — Z8249 Family history of ischemic heart disease and other diseases of the circulatory system: Secondary | ICD-10-CM | POA: Diagnosis not present

## 2018-11-26 DIAGNOSIS — F0390 Unspecified dementia without behavioral disturbance: Secondary | ICD-10-CM | POA: Diagnosis present

## 2018-11-26 DIAGNOSIS — Z90710 Acquired absence of both cervix and uterus: Secondary | ICD-10-CM | POA: Diagnosis not present

## 2018-11-26 DIAGNOSIS — S22080A Wedge compression fracture of T11-T12 vertebra, initial encounter for closed fracture: Secondary | ICD-10-CM | POA: Diagnosis present

## 2018-11-26 DIAGNOSIS — J9621 Acute and chronic respiratory failure with hypoxia: Secondary | ICD-10-CM | POA: Diagnosis present

## 2018-11-26 DIAGNOSIS — Z882 Allergy status to sulfonamides status: Secondary | ICD-10-CM | POA: Diagnosis not present

## 2018-11-26 DIAGNOSIS — Z7982 Long term (current) use of aspirin: Secondary | ICD-10-CM | POA: Diagnosis not present

## 2018-11-26 DIAGNOSIS — Z853 Personal history of malignant neoplasm of breast: Secondary | ICD-10-CM | POA: Diagnosis not present

## 2018-11-26 DIAGNOSIS — Z91013 Allergy to seafood: Secondary | ICD-10-CM | POA: Diagnosis not present

## 2018-11-26 DIAGNOSIS — Z888 Allergy status to other drugs, medicaments and biological substances status: Secondary | ICD-10-CM | POA: Diagnosis not present

## 2018-11-26 DIAGNOSIS — J449 Chronic obstructive pulmonary disease, unspecified: Secondary | ICD-10-CM | POA: Diagnosis present

## 2018-11-26 DIAGNOSIS — Z86718 Personal history of other venous thrombosis and embolism: Secondary | ICD-10-CM | POA: Diagnosis not present

## 2018-11-26 DIAGNOSIS — Z96642 Presence of left artificial hip joint: Secondary | ICD-10-CM | POA: Diagnosis present

## 2018-11-26 DIAGNOSIS — I872 Venous insufficiency (chronic) (peripheral): Secondary | ICD-10-CM | POA: Diagnosis present

## 2018-11-26 DIAGNOSIS — E785 Hyperlipidemia, unspecified: Secondary | ICD-10-CM | POA: Diagnosis present

## 2018-11-26 LAB — CBC
HCT: 37.6 % (ref 36.0–46.0)
Hemoglobin: 11.7 g/dL — ABNORMAL LOW (ref 12.0–15.0)
MCH: 27.2 pg (ref 26.0–34.0)
MCHC: 31.1 g/dL (ref 30.0–36.0)
MCV: 87.4 fL (ref 80.0–100.0)
Platelets: 152 10*3/uL (ref 150–400)
RBC: 4.3 MIL/uL (ref 3.87–5.11)
RDW: 14.3 % (ref 11.5–15.5)
WBC: 9.3 10*3/uL (ref 4.0–10.5)
nRBC: 0 % (ref 0.0–0.2)

## 2018-11-26 LAB — BASIC METABOLIC PANEL
Anion gap: 8 (ref 5–15)
BUN: 9 mg/dL (ref 8–23)
CO2: 32 mmol/L (ref 22–32)
Calcium: 8.2 mg/dL — ABNORMAL LOW (ref 8.9–10.3)
Chloride: 99 mmol/L (ref 98–111)
Creatinine, Ser: 0.31 mg/dL — ABNORMAL LOW (ref 0.44–1.00)
GFR calc Af Amer: >60 mL/min (ref >60)
GFR calc non Af Amer: >60 mL/min (ref >60)
Glucose, Bld: 115 mg/dL — ABNORMAL HIGH (ref 70–99)
Potassium: 3.7 mmol/L (ref 3.5–5.1)
Sodium: 139 mmol/L (ref 135–145)

## 2018-11-26 LAB — BRAIN NATRIURETIC PEPTIDE: B Natriuretic Peptide: 543 pg/mL — ABNORMAL HIGH (ref 0.0–100.0)

## 2018-11-26 MED ORDER — DIVALPROEX SODIUM 250 MG PO DR TAB
250.0000 mg | DELAYED_RELEASE_TABLET | Freq: Two times a day (BID) | ORAL | Status: DC
  Administered 2018-11-26 – 2018-11-29 (×7): 250 mg via ORAL
  Filled 2018-11-26 (×8): qty 1

## 2018-11-26 MED ORDER — FUROSEMIDE 10 MG/ML IJ SOLN
20.0000 mg | Freq: Once | INTRAMUSCULAR | Status: AC
  Administered 2018-11-26: 20 mg via INTRAVENOUS
  Filled 2018-11-26: qty 4

## 2018-11-26 MED ORDER — POTASSIUM CHLORIDE CRYS ER 20 MEQ PO TBCR
20.0000 meq | EXTENDED_RELEASE_TABLET | Freq: Every day | ORAL | Status: DC
  Administered 2018-11-26 – 2018-11-29 (×4): 20 meq via ORAL
  Filled 2018-11-26 (×4): qty 1

## 2018-11-26 MED ORDER — CARVEDILOL 3.125 MG PO TABS
3.1250 mg | ORAL_TABLET | Freq: Two times a day (BID) | ORAL | Status: DC
  Administered 2018-11-26 – 2018-11-29 (×7): 3.125 mg via ORAL
  Filled 2018-11-26 (×7): qty 1

## 2018-11-26 MED ORDER — HYDRALAZINE HCL 50 MG PO TABS
100.0000 mg | ORAL_TABLET | Freq: Two times a day (BID) | ORAL | Status: DC
  Administered 2018-11-26 – 2018-11-29 (×7): 100 mg via ORAL
  Filled 2018-11-26 (×7): qty 2

## 2018-11-26 MED ORDER — CLONIDINE HCL 0.1 MG PO TABS
0.1000 mg | ORAL_TABLET | Freq: Two times a day (BID) | ORAL | Status: DC
  Administered 2018-11-26 – 2018-11-29 (×7): 0.1 mg via ORAL
  Filled 2018-11-26 (×7): qty 1

## 2018-11-26 NOTE — Progress Notes (Signed)
*  PRELIMINARY RESULTS* Echocardiogram 2D Echocardiogram has been performed.  Wallie Char Rosalie Gelpi 11/26/2018, 1:52 PM

## 2018-11-26 NOTE — TOC Initial Note (Signed)
Transition of Care [facility]) - Initial/Assessment Note    Patient Details  Name: Lindsey English MRN: [id number] Date of Birth: 12-04-1929  Transition of Care [facility]) CM/SW Contact:    Cordell Coke, Lenice Llamas Phone Number: [phone]  11/26/2018, 11:28 AM  Clinical Narrative: Clinical Social Worker (CSW) attempted to meet with patient however she appeared confused and was hard of hearing. CSW contacted patient's son [name] to complete assessment. Per son he has been living with patient in [place] since January 2020. Per son patient is open to [facility] and would like to resume those services once patient returns home. Cassie [facility] home [facility] representative is aware of above. Per son he wants patient to come home and does not want her to go to a SNF. Son wants patient to have home oxygen. Qualifying saturations for oxygen are documented. [employer] agency representative is aware of above. Per son patient has 24/7 supervision. Per son when he has to leave the house he gets other family members and friends to stay with patient. Per son patient uses a walker to walk short distances. Patient has a walker at home per son. No DME needs. CSW will continue to follow and assist as needed.                 Expected Discharge Plan: [place] Barriers to Discharge: Continued Medical Work up   Patient Goals and CMS Choice        Expected Discharge Plan and Services Expected Discharge Plan: [place] In-house Referral: Clinical Social Work Discharge Planning Services: CM Consult   Living arrangements for the past 2 months: [place]: PT, RN [place] Agency: [facility] Date HH Agency Contacted: 11/26/18   Representative spoke with at [place]: Cassie  Prior Living Arrangements/Services Living arrangements for the past 2 months: [place] with:: Adult Children Patient language and  need for interpreter reviewed:: No Do you feel safe going back to the place where you live?: Yes      Need for Family Participation in Patient Care: Yes (Comment) Care giver support system in place?: Yes (comment) Current home services: Home PT(Patient is open to [facility].) Criminal Activity/Legal Involvement Pertinent to Current Situation/Hospitalization: No - Comment as needed  Activities of Daily Living   ADL Screening (condition at time of admission) Patient's cognitive ability adequate to safely complete daily activities?: Yes Is the patient deaf or have difficulty hearing?: Yes Does the patient have difficulty seeing, even when wearing glasses/contacts?: (UTA) Patient able to express need for assistance with ADLs?: Yes Does the patient have difficulty dressing or bathing?: No Independently performs ADLs?: Yes (appropriate for developmental age) Does the patient have difficulty walking or climbing stairs?: (UTA) Weakness of Legs: None Weakness of Arms/Hands: None  Permission Sought/Granted Permission sought to share information with : Family Supports Permission granted to share information with : Yes, Verbal Permission Granted              Emotional Assessment Appearance:: Appears stated age   Affect (typically observed): Calm Orientation: : Oriented to Self, Fluctuating Orientation (Suspected and/or reported [place]) Alcohol / Substance Use: Not Applicable Psych Involvement: No (comment)  Admission diagnosis:  Hypoxia [R09.02] Bilateral lower extremity edema [R60.0] Fall, initial encounter [W19.XXXA] Patient Active  Problem List   Diagnosis Date Noted  . Acute on chronic respiratory failure ([place]) 11/25/2018  . Chronic venous insufficiency 06/18/2018  . Hyperlipemia 06/18/2018  . Essential hypertension 06/18/2018  . Malnutrition of moderate degree 06/01/2018  . Failure to thrive (0-17) 05/30/2018  . Dementia ([place]) 11/09/2016  . Lymphedema 04/20/2016   . Ulcer of ankle, left, limited to breakdown of skin ([place]) 04/20/2016  . Ulcer of ankle, right, limited to breakdown of skin ([place]) 04/20/2016  . Cellulitis 02/29/2016   PCP:  Baxter Hire, MD Pharmacy:   [facility], [address] [address] Phone: [phone] Fax: [place], [place] - [address] [place] [address] Phone: [phone] Fax: [place] [id number], [address] [place] [address] Phone: [phone] Fax: [phone]     Social Determinants of Health (SDOH) Interventions    Readmission Risk Interventions No flowsheet data found.

## 2018-11-26 NOTE — H&P (Signed)
[facility] [place] NAME: Lindsey English    MR#:  [id number]  DATE OF BIRTH:  August 26, 1929  DATE OF ADMISSION:  11/25/2018  PRIMARY CARE PHYSICIAN: Baxter Hire, MD   REQUESTING/REFERRING PHYSICIAN: Kerin Salen, physician assistant  CHIEF COMPLAINT:   Chief Complaint  Patient presents with   Fall    HISTORY OF PRESENT ILLNESS:  Lindsey English  is a 85 y.o. female with a known history of asthma, diabetes, hypertension, hyperthyroidism, history of right breast cancer, history of DVT.  Patient presented to the emergency room after sustaining a fall.  She was carrying a casserole dish when she tripped and fell.  She denies hitting her head.  She denies loss of consciousness.  CT head was completed with no acute intracranial abnormalities.  CT cervical spine also completed with no acute fracture.  Lumbar x-rays show no acute fracture.  Patient was accompanied by her son to the emergency room who reported diminished oxygen saturations recently when the patient has nurse home visits.  He reports oxygen being 90% at times.  O2 saturation was noted to be 88% in the emergency room.  She increased to 96% on 2 L nasal cannula.  Patient's son reports he has noted no overt shortness of breath.  She is not on home oxygen therapy.  She denies fever or chills.  She denies chest pain.  She reports lower extremities have been more edematous over the last 3 days.  No wounds appreciated.  She denies abdominal pain.  She denies nausea, vomiting, diarrhea.  Potassium is 3.0 on her arrival.  She has received p.o. potassium replacement.  BNP is 366.  Chest x-ray shows no acute pulmonary disease.  Rapid COVID-19 testing is negative  Patient has been admitted to the hospitalist service for observation  PAST MEDICAL HISTORY:   Past Medical History:  Diagnosis Date   Asthma    Cancer ([place])    Right   Diabetes mellitus without complication ([place])    DVT (deep  venous thrombosis) ([place])    Hiatal hernia    Hyperlipemia    Hypertension    Hyperthyroidism     PAST SURGICAL HISTORY:   Past Surgical History:  Procedure Laterality Date   ABDOMINAL HYSTERECTOMY     BREAST SURGERY Right    TOTAL HIP ARTHROPLASTY Left     SOCIAL HISTORY:   Social History   Tobacco Use   Smoking status: Former Smoker   Smokeless tobacco: Never Used  Substance Use Topics   Alcohol use: No    FAMILY HISTORY:   Family History  Problem Relation Age of Onset   Hypertension Mother     DRUG ALLERGIES:   Allergies  Allergen Reactions   Shrimp [Shellfish Allergy]    Sulfa Antibiotics Nausea Only   Clindamycin Hcl Rash   Keflex [Cephalexin] Rash   Vancomycin Other (See Comments)    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  HENT: Negative for congestion and sore throat.   Eyes: Negative for blurred vision and double vision.  Respiratory: Positive for shortness of breath. Negative for cough, hemoptysis, sputum production, wheezing and stridor.   Cardiovascular: Negative for chest pain and palpitations.  Gastrointestinal: Negative for abdominal pain, blood in stool, constipation, diarrhea, heartburn, nausea and vomiting.  Genitourinary: Negative for dysuria, flank pain, frequency and hematuria.  Musculoskeletal: Positive for falls. Negative for myalgias.  Skin: Negative.  Negative for itching and rash.  Neurological: Positive for weakness. Negative for dizziness, tingling, tremors, sensory change, seizures, loss of consciousness and headaches.  Psychiatric/Behavioral: Negative.  Negative for depression.     MEDICATIONS AT HOME:   Prior to Admission medications   Medication Sig Start Date End Date Taking? Authorizing Provider  amLODipine (NORVASC) 10 MG tablet Take 1 tablet (10 mg total) by mouth daily.  06/01/18  Yes Epifanio Lesches, MD  aspirin EC 81 MG tablet Take 81 mg by mouth daily.   Yes [provider]  carvedilol (COREG) 3.125 MG tablet Take 1 tablet (3.125 mg total) by mouth 2 (two) times daily. 06/01/18 06/01/19 Yes Epifanio Lesches, MD  cloNIDine (CATAPRES) 0.1 MG tablet Take 0.1 mg by mouth 2 (two) times daily.   Yes [provider]  divalproex (DEPAKOTE) 250 MG DR tablet Take 250 mg by mouth 2 (two) times a day. 08/24/18  Yes [provider]  furosemide (LASIX) 40 MG tablet Take 40 mg by mouth daily. 06/27/18  Yes [provider]  hydrALAZINE (APRESOLINE) 100 MG tablet Take 1 tablet (100 mg total) by mouth 3 (three) times daily. Patient taking differently: Take 100 mg by mouth 2 (two) times daily.  06/01/18 06/01/19 Yes Epifanio Lesches, MD  Multiple Vitamin (MULTIVITAMIN WITH MINERALS) TABS tablet Take 1 tablet by mouth daily. 06/01/18  Yes Epifanio Lesches, MD  potassium chloride (MICRO-K) 10 MEQ CR capsule Take 10 mEq by mouth 2 (two) times a day.   Yes [provider]  simvastatin (ZOCOR) 10 MG tablet Take 10 mg by mouth at bedtime.   Yes [provider]      VITAL SIGNS:  Blood pressure 138/68, pulse 71, temperature 98.7 F (37.1 C), temperature source Oral, resp. rate 17, height 5\' 2"  (1.575 m), weight 54.8 kg, SpO2 92 %.  PHYSICAL EXAMINATION:  Physical Exam  GENERAL:  85 y.o.-year-old patient lying in the bed with no acute distress.  EYES: Pupils equal, round, reactive to light and accommodation. No scleral icterus. Extraocular muscles intact.  HEENT: Head atraumatic, normocephalic. Oropharynx and nasopharynx clear.  NECK:  Supple, no jugular venous distention. No thyroid enlargement, no tenderness.  LUNGS: Normal breath sounds bilaterally, no wheezing, rales,rhonchi or crepitation. No use of accessory muscles of respiration.  CARDIOVASCULAR: Regular rate and rhythm, S1, S2 normal. No murmurs, rubs, or gallops.   ABDOMEN: Soft, nondistended, nontender. Bowel sounds present. No organomegaly or mass.  EXTREMITIES: Bilateral lower extremity edema below the knee NEUROLOGIC: Decreased hearing acuity. Muscle strength 5/5 in all extremities. Sensation intact. Gait not checked.  PSYCHIATRIC: The patient is alert and oriented x 3.  Normal affect and good eye contact. SKIN: No obvious rash, lesion, or ulcer.   LABORATORY PANEL:   CBC Recent Labs  Lab 11/25/18 2046  WBC 11.5*  HGB 12.0  HCT 38.0  PLT 146*   ------------------------------------------------------------------------------------------------------------------  Chemistries  Recent Labs  Lab 11/25/18 2046  NA 140  K 3.0*  CL 96*  CO2 35*  GLUCOSE 125*  BUN 11  CREATININE 0.37*  CALCIUM 8.3*   ------------------------------------------------------------------------------------------------------------------  Cardiac Enzymes No results for input(s): TROPONINI in the last 168 hours. ------------------------------------------------------------------------------------------------------------------  RADIOLOGY:  Dg Ribs Unilateral W/chest Right  Result Date: 11/25/2018 CLINICAL DATA:  [place].  Right sided chest pain. EXAM: RIGHT RIBS AND CHEST - 3+ VIEW COMPARISON:  None. FINDINGS: The cardiac silhouette, mediastinal and hilar contours are within normal limits. Moderate  tortuosity and calcification of the thoracic aorta. The lungs are clear. No pleural effusion or pneumothorax. No worrisome pulmonary lesions. Dedicated views of the right ribs do not demonstrate any definite acute right rib fractures. No pleural thickening or pleural fluid. IMPRESSION: 1. No acute cardiopulmonary findings. 2. No definite acute right-sided rib fractures. Electronically Signed   By: Marijo Sanes M.D.   On: 11/25/2018 17:46   Dg Lumbar Spine 2-3 Views  Result Date: 11/25/2018 CLINICAL DATA:  [place].  Back pain. EXAM: LUMBAR SPINE - 2-3 VIEW COMPARISON:  04/12/2012  FINDINGS: Normal alignment of the lumbar vertebral bodies. Disc spaces and vertebral bodies are maintained. No acute fractures identified. The facets are grossly normally aligned. Extensive vascular calcifications are noted. IMPRESSION: No acute lumbar spine fracture. Electronically Signed   By: Marijo Sanes M.D.   On: 11/25/2018 17:47   Ct Head Wo Contrast  Result Date: 11/25/2018 CLINICAL DATA:  [place].  Pain. EXAM: CT HEAD WITHOUT CONTRAST CT CERVICAL SPINE WITHOUT CONTRAST TECHNIQUE: Multidetector CT imaging of the head and cervical spine was performed following the standard protocol without intravenous contrast. Multiplanar CT image reconstructions of the cervical spine were also generated. COMPARISON:  None. FINDINGS: CT HEAD FINDINGS Brain: There is a lacunar infarct in the right cerebellar hemisphere on axial image 5. Another small lacunar infarct is seen in the right cerebellar hemisphere on image 10. Cerebellum, brainstem, and basal cisterns are otherwise normal. Ventricles and sulci are prominent but stable. Chronic white matter changes are identified. No acute cortical ischemia or infarct. No mass effect or midline shift. No subdural, epidural, or subarachnoid hemorrhage. Vascular: No hyperdense vessel or unexpected calcification. Skull: Normal. Negative for fracture or focal lesion. Sinuses/Orbits: No acute finding. Other: None. CT CERVICAL SPINE FINDINGS Alignment: Normal. Skull base and vertebrae: No acute fracture. No primary bone lesion or focal pathologic process. Soft tissues and spinal canal: No prevertebral fluid or swelling. No visible canal hematoma. Disc levels:  Multilevel degenerative changes. Upper chest: Negative. Other: No other abnormalities. IMPRESSION: 1. Chronic white matter changes and lacunar infarcts. No acute intracranial abnormalities identified. 2. No fracture or traumatic malalignment in the cervical spine. Electronically Signed   By: Dorise Bullion III M.D   On: 11/25/2018  18:01   Ct Cervical Spine Wo Contrast  Result Date: 11/25/2018 CLINICAL DATA:  Fall.  Pain. EXAM: CT HEAD WITHOUT CONTRAST CT CERVICAL SPINE WITHOUT CONTRAST TECHNIQUE: Multidetector CT imaging of the head and cervical spine was performed following the standard protocol without intravenous contrast. Multiplanar CT image reconstructions of the cervical spine were also generated. COMPARISON:  None. FINDINGS: CT HEAD FINDINGS Brain: There is a lacunar infarct in the right cerebellar hemisphere on axial image 5. Another small lacunar infarct is seen in the right cerebellar hemisphere on image 10. Cerebellum, brainstem, and basal cisterns are otherwise normal. Ventricles and sulci are prominent but stable. Chronic white matter changes are identified. No acute cortical ischemia or infarct. No mass effect or midline shift. No subdural, epidural, or subarachnoid hemorrhage. Vascular: No hyperdense vessel or unexpected calcification. Skull: Normal. Negative for fracture or focal lesion. Sinuses/Orbits: No acute finding. Other: None. CT CERVICAL SPINE FINDINGS Alignment: Normal. Skull base and vertebrae: No acute fracture. No primary bone lesion or focal pathologic process. Soft tissues and spinal canal: No prevertebral fluid or swelling. No visible canal hematoma. Disc levels:  Multilevel degenerative changes. Upper chest: Negative. Other: No other abnormalities. IMPRESSION: 1. Chronic white matter changes and lacunar infarcts. No acute intracranial abnormalities  identified. 2. No fracture or traumatic malalignment in the cervical spine. Electronically Signed   By: Dorise Bullion III M.D   On: 11/25/2018 18:01   Dg Hip Unilat W Or Wo Pelvis 2-3 Views Right  Result Date: 11/25/2018 CLINICAL DATA:  [place].  Right hip pain. EXAM: DG HIP (WITH OR WITHOUT PELVIS) 2-3V RIGHT COMPARISON:  None. FINDINGS: The left total hip arthroplasty is intact. The right hip demonstrates advanced degenerative changes with joint space  narrowing and spurring but no acute fracture is identified. No evidence of AVN. The pubic symphysis and SI joints are intact. No pelvic fractures or bone lesions. IMPRESSION: 1. Intact left total hip arthroplasty. 2. Advanced right hip joint degenerative changes but no definite acute hip fracture. 3. No definite pelvic fractures. Electronically Signed   By: Marijo Sanes M.D.   On: 11/25/2018 17:40      IMPRESSION AND PLAN:   1.  Hypoxic respiratory failure - Patient is on O2 per nasal cannula at 2 L/min - May be secondary to exacerbation of CHF.  Will get echocardiogram - Social service consulted for likely need for home oxygen therapy at the time of discharge  2.  Mechanical fall - No evidence of fracture - PT and OT consulted for supportive care  3.  Hypokalemia - P.o. potassium replacement -Repeat BMP in the a.m. -Telemetry monitoring  4.  Left lower extremity wound - Wound care nurse consulted for dressing changes/recommendations  5. hypertension - norvasc, clonidine, Coreg continued  DVT and PPI prophylaxis initiated  All the records are reviewed and case discussed with ED provider. The plan of care was discussed in details with the patient (and family). I answered all questions. The patient agreed to proceed with the above mentioned plan. Further management will depend upon hospital course.   CODE STATUS: Full code  TOTAL TIME TAKING CARE OF THIS PATIENT:45 minutes.    Ryan Park on 11/26/2018 at 12:03 AM  Pager - [phone]  After 6pm go to [email or web address] - Proofreader  [facility]  Office  [phone]  CC: Primary care physician; Baxter Hire, MD   Note: This dictation was prepared with Dragon dictation along with smaller phrase technology. Any transcriptional errors that result from this process are unintentional.

## 2018-11-26 NOTE — Consult Note (Signed)
[facility] Nurse wound consult note Reason for Consult: Venous insufficiency with partial thickness skin loss and dermatitis to RLE Wound type:vernous insufficiency Pressure Injury POA:NA Measurement: 14cm x 10cm x 0.1cm Wound TMH:DQQIW red  Drainage (amount, consistency, odor) None Periwound: dry Dressing procedure/placement/frequency: I will implement a conservative POC with no compression using white petrolatum to provide continually moist wound care. HHRN is suggested to oversee care of LE by family care providers and to teach local wound care. If you agree, please order.  [facility] nursing team will not follow, but will remain available to this patient, the nursing and medical teams.  Please re-consult if needed. Thanks, Maudie Flakes, MSN, RN, [place], Arther Abbott  Pager# [phone]

## 2018-11-26 NOTE — Care Management Obs Status (Signed)
Bureau NOTIFICATION   Patient Details  Name: Lindsey English MRN: [id number] Date of Birth: 1930/01/21   Medicare Observation Status Notification Given:  Yes(Patient was confused so CSW contacted patient's son [name] and verbally reviewed observation status.)    Khayri Kargbo, Veronia Beets, LCSW 11/26/2018, 11:24 AM

## 2018-11-26 NOTE — Progress Notes (Signed)
Patient off the floor to MRI with transporter and oxygen via bed.

## 2018-11-26 NOTE — Progress Notes (Signed)
PT Cancellation Note  Patient Details Name: Lindsey English MRN: [id number] DOB: 13-Jun-1929   Cancelled Treatment:    Reason Eval/Treat Not Completed: Medical issues which prohibited therapy(Consult received and chart reviewed.  Patient noted with acute T11 compression fracture; pending ortho consult.  Will hold PT evaluation until consult complete, recommendations received.  Will continue to follow and initiate as medically appropriate.)   Mecca Guitron H. Owens Shark, PT, DPT, NCS 11/26/18, 1:33 PM [phone]

## 2018-11-26 NOTE — Progress Notes (Signed)
SATURATION QUALIFICATIONS: (This note is used to comply with regulatory documentation for home oxygen)  Patient Saturations on Room Air at Rest = 81%  Patient Saturations on Room Air while Ambulating = patient unable to ambulate at this time  Patient Saturations on 2 Liters of oxygen while Resting  = 87%  Patient Saturations on 3 Liters of oxygen while Resting  = 91%  Patient Saturations on 3.5 Liters of oxygen while Resting  = 95%     Please briefly explain why patient needs home oxygen: Patient unable to oxygenate properly at rest

## 2018-11-26 NOTE — Progress Notes (Addendum)
[facility] [place] NAME: Lindsey English    MR#:  [id number]  DATE OF BIRTH:  November 07, 1929  SUBJECTIVE:   c/o back pain  REVIEW OF SYSTEMS:    Review of Systems  Constitutional: Negative for fever, chills weight loss HENT: Negative for ear pain, nosebleeds, congestion, facial swelling, rhinorrhea, neck pain, neck stiffness and ear discharge.   Respiratory: Negative for cough, shortness of breath, wheezing  Cardiovascular: Negative for chest pain, palpitations and leg swelling.  Gastrointestinal: Negative for heartburn, abdominal pain, vomiting, diarrhea or consitpation Genitourinary: Negative for dysuria, urgency, frequency, hematuria Musculoskeletal: ++ back pain   Neurological: Negative for dizziness, seizures, syncope, focal weakness,  numbness and headaches.  Hematological: Does not bruise/bleed easily.  Psychiatric/Behavioral: Negative for hallucinations, confusion, dysphoric mood    Tolerating Diet: yes      DRUG ALLERGIES:   Allergies  Allergen Reactions  . Shrimp [Shellfish Allergy]   . Sulfa Antibiotics Nausea Only  . Clindamycin Hcl Rash  . Keflex [Cephalexin] Rash  . 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 rate 16, height 5\' 2"  (1.575 m), weight 58.5 kg, SpO2 94 %.  PHYSICAL EXAMINATION:  Constitutional: Appears well-developed and well-nourished. No distress. HENT: Normocephalic. Marland Kitchen Oropharynx is clear and moist.  Eyes: Conjunctivae and EOM are normal. PERRLA, no scleral icterus.  Neck: Normal ROM. Neck supple. No JVD. No tracheal deviation. CVS: RRR, S1/S2 +, no murmurs, no gallops, no carotid bruit.  Pulmonary: Effort and breath sounds normal, no stridor, rhonchi, wheezes, rales.  Abdominal: Soft. BS +,  no distension, tenderness, rebound or guarding.   Musculoskeletal: Normal range of motion. No edema and no tenderness.  Neuro: Alert. CN 2-12 grossly intact. No focal deficits. Skin: Skin is warm and dry. LLE skin changes Psychiatric: Normal mood and affect.  Back :tenderness L3-L5    LABORATORY PANEL:   CBC Recent Labs  Lab 11/26/18 0521  WBC 9.3  HGB 11.7*  HCT 37.6  PLT 152   ------------------------------------------------------------------------------------------------------------------  Chemistries  Recent Labs  Lab 11/26/18 0521  NA 139  K 3.7  CL 99  CO2 32  GLUCOSE 115*  BUN 9  CREATININE 0.31*  CALCIUM 8.2*   ------------------------------------------------------------------------------------------------------------------  Cardiac Enzymes No results for input(s): TROPONINI in the last 168 hours. ------------------------------------------------------------------------------------------------------------------  RADIOLOGY:  Dg Chest 1 View  Result Date: 11/26/2018 CLINICAL DATA:  85 year old female with hypoxia EXAM: CHEST  1 VIEW COMPARISON:  11/25/2018, 05/29/2018 FINDINGS: Cardiomediastinal silhouette unchanged in size and contour. Calcifications of the aortic arch. Low lung volumes with coarsened interstitial markings. Fullness in the central vasculature with mild interlobular septal thickening. No pneumothorax. No large pleural effusion. No confluent airspace disease. IMPRESSION: Low lung volumes with prominence of the central vasculature and mild interlobular septal thickening, potentially changes of early pulmonary edema versus chronic changes and prior episodes of CHF. Electronically Signed   By: Corrie Mckusick D.O.   On: 11/26/2018 08:41   Dg Ribs Unilateral W/chest Right  Result Date: 11/25/2018 CLINICAL DATA:  [place].  Right sided chest pain. EXAM: RIGHT RIBS AND CHEST - 3+ VIEW COMPARISON:  None. FINDINGS: The cardiac silhouette, mediastinal and hilar contours are within normal limits. Moderate  tortuosity and calcification of the thoracic aorta. The lungs are clear. No pleural effusion or pneumothorax. No worrisome pulmonary lesions. Dedicated  views of the right ribs do not demonstrate any definite acute right rib fractures. No pleural thickening or pleural fluid. IMPRESSION: 1. No acute cardiopulmonary findings. 2. No definite acute right-sided rib fractures. Electronically Signed   By: Marijo Sanes M.D.   On: 11/25/2018 17:46   Dg Lumbar Spine 2-3 Views  Result Date: 11/25/2018 CLINICAL DATA:  [place].  Back pain. EXAM: LUMBAR SPINE - 2-3 VIEW COMPARISON:  04/12/2012 FINDINGS: Normal alignment of the lumbar vertebral bodies. Disc spaces and vertebral bodies are maintained. No acute fractures identified. The facets are grossly normally aligned. Extensive vascular calcifications are noted. IMPRESSION: No acute lumbar spine fracture. Electronically Signed   By: Marijo Sanes M.D.   On: 11/25/2018 17:47   Ct Head Wo Contrast  Result Date: 11/25/2018 CLINICAL DATA:  Fall.  Pain. EXAM: CT HEAD WITHOUT CONTRAST CT CERVICAL SPINE WITHOUT CONTRAST TECHNIQUE: Multidetector CT imaging of the head and cervical spine was performed following the standard protocol without intravenous contrast. Multiplanar CT image reconstructions of the cervical spine were also generated. COMPARISON:  None. FINDINGS: CT HEAD FINDINGS Brain: There is a lacunar infarct in the right cerebellar hemisphere on axial image 5. Another small lacunar infarct is seen in the right cerebellar hemisphere on image 10. Cerebellum, brainstem, and basal cisterns are otherwise normal. Ventricles and sulci are prominent but stable. Chronic white matter changes are identified. No acute cortical ischemia or infarct. No mass effect or midline shift. No subdural, epidural, or subarachnoid hemorrhage. Vascular: No hyperdense vessel or unexpected calcification. Skull: Normal. Negative for fracture or focal lesion. Sinuses/Orbits: No acute finding. Other:  None. CT CERVICAL SPINE FINDINGS Alignment: Normal. Skull base and vertebrae: No acute fracture. No primary bone lesion or focal pathologic process. Soft tissues and spinal canal: No prevertebral fluid or swelling. No visible canal hematoma. Disc levels:  Multilevel degenerative changes. Upper chest: Negative. Other: No other abnormalities. IMPRESSION: 1. Chronic white matter changes and lacunar infarcts. No acute intracranial abnormalities identified. 2. No fracture or traumatic malalignment in the cervical spine. Electronically Signed   By: Dorise Bullion III M.D   On: 11/25/2018 18:01   Ct Cervical Spine Wo Contrast  Result Date: 11/25/2018 CLINICAL DATA:  Fall.  Pain. EXAM: CT HEAD WITHOUT CONTRAST CT CERVICAL SPINE WITHOUT CONTRAST TECHNIQUE: Multidetector CT imaging of the head and cervical spine was performed following the standard protocol without intravenous contrast. Multiplanar CT image reconstructions of the cervical spine were also generated. COMPARISON:  None. FINDINGS: CT HEAD FINDINGS Brain: There is a lacunar infarct in the right cerebellar hemisphere on axial image 5. Another small lacunar infarct is seen in the right cerebellar hemisphere on image 10. Cerebellum, brainstem, and basal cisterns are otherwise normal. Ventricles and sulci are prominent but stable. Chronic white matter changes are identified. No acute cortical ischemia or infarct. No mass effect or midline shift. No subdural, epidural, or subarachnoid hemorrhage. Vascular: No hyperdense vessel or unexpected calcification. Skull: Normal. Negative for fracture or focal lesion. Sinuses/Orbits: No acute finding. Other: None. CT CERVICAL SPINE FINDINGS Alignment: Normal. Skull base and vertebrae: No acute fracture. No primary bone lesion or focal pathologic process. Soft tissues and spinal canal: No prevertebral fluid or swelling. No visible canal hematoma. Disc levels:  Multilevel degenerative changes. Upper chest: Negative. Other: No  other abnormalities. IMPRESSION: 1. Chronic white matter changes and lacunar infarcts. No acute intracranial abnormalities identified. 2. No fracture or traumatic malalignment in the cervical spine. Electronically Signed   By: Dorise Bullion III M.D  On: 11/25/2018 18:01   US Venous Img Lower Bilateral  Result Date: 11/26/2018 CLINICAL DATA:  Bilateral lower extremity edema. EXAM: BILATERAL LOWER EXTREMITY VENOUS DOPPLER ULTRASOUND TECHNIQUE: Gray-scale sonography with graded compression, as well as color Doppler and duplex ultrasound were performed to evaluate the lower extremity deep venous systems from the level of the common femoral vein and including the common femoral, femoral, profunda femoral, popliteal and calf veins including the posterior tibial, peroneal and gastrocnemius veins when visible. The superficial great saphenous vein was also interrogated. Spectral Doppler was utilized to evaluate flow at rest and with distal augmentation maneuvers in the common femoral, femoral and popliteal veins. COMPARISON:  Lower extremity duplex 07/08/2016 FINDINGS: RIGHT LOWER EXTREMITY Common Femoral Vein: No evidence of thrombus. Normal compressibility, respiratory phasicity and response to augmentation. Saphenofemoral Junction: No evidence of thrombus. Normal compressibility and flow on color Doppler imaging. Profunda Femoral Vein: No evidence of thrombus. Normal compressibility and flow on color Doppler imaging. Femoral Vein: No evidence of thrombus. Normal compressibility, respiratory phasicity and response to augmentation. Popliteal Vein: No evidence of thrombus. Normal compressibility, respiratory phasicity and response to augmentation. Calf Veins: No evidence of thrombus in the posterior tibial vein. Normal compressibility and flow on color Doppler imaging. Peroneal vein not well visualized. Patient did not tolerate compression. Superficial Great Saphenous Vein: No evidence of thrombus. Normal  compressibility. Venous Reflux:  None. Other Findings:  None. LEFT LOWER EXTREMITY Common Femoral Vein: No evidence of thrombus. Normal compressibility, respiratory phasicity and response to augmentation. Saphenofemoral Junction: No evidence of thrombus. Normal compressibility and flow on color Doppler imaging. Profunda Femoral Vein: No evidence of thrombus. Normal compressibility and flow on color Doppler imaging. Femoral Vein: No evidence of thrombus. Normal compressibility, respiratory phasicity and response to augmentation. Popliteal Vein: No evidence of thrombus. Normal compressibility, respiratory phasicity and response to augmentation. Calf Veins: No evidence of thrombus of the posterior tibial. Normal compressibility and flow on color Doppler imaging. Peroneal vein not well visualized. Patient did not tolerate compression. Superficial Great Saphenous Vein: No evidence of thrombus. Normal compressibility. Venous Reflux:  None. Other Findings:  None. IMPRESSION: No evidence of deep venous thrombosis in either lower extremity. Electronically Signed   By: Keith Rake M.D.   On: 11/26/2018 01:17   Dg Hip Unilat W Or Wo Pelvis 2-3 Views Right  Result Date: 11/25/2018 CLINICAL DATA:  [place].  Right hip pain. EXAM: DG HIP (WITH OR WITHOUT PELVIS) 2-3V RIGHT COMPARISON:  None. FINDINGS: The left total hip arthroplasty is intact. The right hip demonstrates advanced degenerative changes with joint space narrowing and spurring but no acute fracture is identified. No evidence of AVN. The pubic symphysis and SI joints are intact. No pelvic fractures or bone lesions. IMPRESSION: 1. Intact left total hip arthroplasty. 2. Advanced right hip joint degenerative changes but no definite acute hip fracture. 3. No definite pelvic fractures. Electronically Signed   By: Marijo Sanes M.D.   On: 11/25/2018 17:40     ASSESSMENT AND PLAN:   85 year old female with a history of diabetes who presented to the ER after  mechanical fall and was noted to have hypoxia.  1.  Acute hypoxic respiratory failure on chronic: I ordered chest x-ray which shows possible atelectasis versus edema. 20 mg IV x1 of Lasix Follow-up on echocardiogram Incentive spirometer at bedside Wean oxygen as tolerated  2.  Back pain after mechanical fall: We will order MRI to evaluate for underlying vertebral fracture. Pain control and physical therapy consultation  3.  Hypokalemia: Replete and recheck in a.m.  4.  Left lower extremity wound present on admission: Wound care consultation  5.  Essential hypertension: Continue Norvasc, Coreg, clonidine, hydralazine     Management plans discussed with the patient and sh is in agreement.  CODE STATUS: full  TOTAL TIME TAKING CARE OF THIS PATIENT: 30 minutes.     POSSIBLE D/C tomorrow, DEPENDING ON CLINICAL CONDITION.   Bettey Costa M.D on 11/26/2018 at 11:01 AM  Between 7am to 6pm - Pager - [phone] After 6pm go to [email or web address] - password [email or web address] [facility]  Office  [phone]  CC: Primary care physician; Baxter Hire, MD  Note: This dictation was prepared with Dragon dictation along with smaller phrase technology. Any transcriptional errors that result from this process are unintentional.

## 2018-11-26 NOTE — Consult Note (Signed)
Reason for consult is T11 fracture  History patient is a 85 year old who lives with her son.  She is quite demented.  She came to the emergency room with shortness of breath and pain and subsequent MRI shows T11 fracture with extensive edema throughout the vertebral body.  She is confused but on discuss asking her she does states she is hurting when she moves she has a great deal of pain  Her son reports that she is household ambulator with severe confusion.  Physical exam she is able to flex tender toes she is tender to percussion in the mid back and there is a slight kyphotic deformity at T11.  Radiographic review shows MRI lumbar spine showing T11 fracture acute  Impression is severe pain from T11 fracture  Recommendation is for kyphoplasty.  I discussed this with her son he will consider and talk to her other children and let me know.  Also will need to have her respiratory status improved but it by her O2 sats it is quite a bit better than since admission.  He will let me know.

## 2018-11-27 ENCOUNTER — Inpatient Hospital Stay: Payer: Medicare Other | Admitting: Anesthesiology

## 2018-11-27 ENCOUNTER — Inpatient Hospital Stay: Payer: Medicare Other

## 2018-11-27 ENCOUNTER — Encounter
Admission: EM | Disposition: A | Discharge status: Skilled Nursing Facility | Payer: Self-pay | Source: Home / Self Care | Attending: Internal Medicine

## 2018-11-27 ENCOUNTER — Encounter: Payer: Self-pay | Admitting: *Deleted

## 2018-11-27 HISTORY — PX: KYPHOPLASTY: SHX5884

## 2018-11-27 LAB — ECHOCARDIOGRAM COMPLETE
Height: 62 in
Weight: 2064 oz

## 2018-11-27 LAB — GLUCOSE, CAPILLARY
Glucose-Capillary: 136 mg/dL — ABNORMAL HIGH (ref 70–99)
Glucose-Capillary: 115 mg/dL — ABNORMAL HIGH (ref 70–99)

## 2018-11-27 LAB — BASIC METABOLIC PANEL
Anion gap: 7 (ref 5–15)
BUN: 11 mg/dL (ref 8–23)
CO2: 34 mmol/L — ABNORMAL HIGH (ref 22–32)
Calcium: 8.5 mg/dL — ABNORMAL LOW (ref 8.9–10.3)
Chloride: 98 mmol/L (ref 98–111)
Creatinine, Ser: 0.43 mg/dL — ABNORMAL LOW (ref 0.44–1.00)
GFR calc Af Amer: >60 mL/min (ref >60)
GFR calc non Af Amer: >60 mL/min (ref >60)
Glucose, Bld: 103 mg/dL — ABNORMAL HIGH (ref 70–99)
Potassium: 4.3 mmol/L (ref 3.5–5.1)
Sodium: 139 mmol/L (ref 135–145)

## 2018-11-27 SURGERY — KYPHOPLASTY
Anesthesia: General | Site: Back

## 2018-11-27 MED ORDER — DOCUSATE SODIUM 100 MG PO CAPS
100.0000 mg | ORAL_CAPSULE | Freq: Two times a day (BID) | ORAL | Status: DC
  Administered 2018-11-27 – 2018-11-29 (×4): 100 mg via ORAL
  Filled 2018-11-27 (×4): qty 1

## 2018-11-27 MED ORDER — PROPOFOL 500 MG/50ML IV EMUL
INTRAVENOUS | Status: AC
  Filled 2018-11-27: qty 50

## 2018-11-27 MED ORDER — METOCLOPRAMIDE HCL 5 MG/ML IJ SOLN: 5.0000 mg | Freq: Three times a day (TID) | INTRAMUSCULAR | Status: DC | PRN

## 2018-11-27 MED ORDER — ONDANSETRON HCL 4 MG PO TABS: 4.0000 mg | ORAL_TABLET | Freq: Four times a day (QID) | ORAL | Status: DC | PRN

## 2018-11-27 MED ORDER — ONDANSETRON HCL 4 MG/2ML IJ SOLN: 4.0000 mg | Freq: Four times a day (QID) | INTRAMUSCULAR | Status: DC | PRN

## 2018-11-27 MED ORDER — PROPOFOL 10 MG/ML IV BOLUS
INTRAVENOUS | Status: DC | PRN
  Administered 2018-11-27: 10 mg via INTRAVENOUS

## 2018-11-27 MED ORDER — IOHEXOL 180 MG/ML  SOLN
INTRAMUSCULAR | Status: DC | PRN
  Administered 2018-11-27: 20 mL via INTRAVENOUS

## 2018-11-27 MED ORDER — ENOXAPARIN SODIUM 40 MG/0.4ML ~~LOC~~ SOLN
40.0000 mg | SUBCUTANEOUS | Status: DC
  Administered 2018-11-28 – 2018-11-29 (×2): 40 mg via SUBCUTANEOUS
  Filled 2018-11-27 (×2): qty 0.4

## 2018-11-27 MED ORDER — SODIUM CHLORIDE 0.9 % IV SOLN
INTRAVENOUS | Status: DC | PRN
  Administered 2018-11-27: 14:00:00 via INTRAVENOUS

## 2018-11-27 MED ORDER — BUPIVACAINE-EPINEPHRINE (PF) 0.5% -1:200000 IJ SOLN
INTRAMUSCULAR | Status: DC | PRN
  Administered 2018-11-27: 10 mL via PERINEURAL

## 2018-11-27 MED ORDER — KETAMINE HCL 50 MG/ML IJ SOLN
INTRAMUSCULAR | Status: DC | PRN
  Administered 2018-11-27: 25 mg via INTRAMUSCULAR

## 2018-11-27 MED ORDER — ENOXAPARIN SODIUM 30 MG/0.3ML ~~LOC~~ SOLN: 30.0000 mg | SUBCUTANEOUS | Status: DC

## 2018-11-27 MED ORDER — FENTANYL CITRATE (PF) 100 MCG/2ML IJ SOLN: 25.0000 ug | INTRAMUSCULAR | Status: DC | PRN

## 2018-11-27 MED ORDER — FUROSEMIDE 10 MG/ML IJ SOLN
20.0000 mg | Freq: Once | INTRAMUSCULAR | Status: AC
  Administered 2018-11-27: 20 mg via INTRAVENOUS
  Filled 2018-11-27: qty 4

## 2018-11-27 MED ORDER — MORPHINE SULFATE (PF) 2 MG/ML IV SOLN: 1.0000 mg | INTRAVENOUS | Status: DC | PRN

## 2018-11-27 MED ORDER — CLINDAMYCIN PHOSPHATE 600 MG/50ML IV SOLN
600.0000 mg | Freq: Once | INTRAVENOUS | Status: DC
  Filled 2018-11-27: qty 50

## 2018-11-27 MED ORDER — LIDOCAINE HCL 1 % IJ SOLN
INTRAMUSCULAR | Status: DC | PRN
  Administered 2018-11-27 (×2): 10 mL

## 2018-11-27 MED ORDER — PROPOFOL 500 MG/50ML IV EMUL
INTRAVENOUS | Status: DC | PRN
  Administered 2018-11-27: 25 ug/kg/min via INTRAVENOUS

## 2018-11-27 MED ORDER — TRAMADOL HCL 50 MG PO TABS
50.0000 mg | ORAL_TABLET | Freq: Four times a day (QID) | ORAL | Status: DC
  Administered 2018-11-27 – 2018-11-29 (×5): 50 mg via ORAL
  Filled 2018-11-27 (×5): qty 1

## 2018-11-27 MED ORDER — METOCLOPRAMIDE HCL 10 MG PO TABS: 5.0000 mg | ORAL_TABLET | Freq: Three times a day (TID) | ORAL | Status: DC | PRN

## 2018-11-27 SURGICAL SUPPLY — 23 items
BNDG ADH 2 X3.75 FABRIC TAN LF (GAUZE/BANDAGES/DRESSINGS) ×2 IMPLANT
CEMENT KYPHON CX01A KIT/MIXER (Cement) ×3 IMPLANT
COVER WAND RF STERILE (DRAPES) ×3 IMPLANT
DERMABOND ADVANCED (GAUZE/BANDAGES/DRESSINGS) ×2
DERMABOND ADVANCED .7 DNX12 (GAUZE/BANDAGES/DRESSINGS) ×1 IMPLANT
DEVICE BIOPSY BONE KYPH (INSTRUMENTS) ×2 IMPLANT
DEVICE BIOPSY BONE KYPHX (INSTRUMENTS) ×3 IMPLANT
DRAPE C-ARM XRAY 36X54 (DRAPES) ×3 IMPLANT
DURAPREP 26ML APPLICATOR (WOUND CARE) ×3 IMPLANT
FEE RENTAL RFA GENERATOR (MISCELLANEOUS) IMPLANT
GLOVE SURG SYN 9.0  PF PI (GLOVE) ×2
GLOVE SURG SYN 9.0 PF PI (GLOVE) ×1 IMPLANT
GOWN SRG 2XL LVL 4 RGLN SLV (GOWNS) ×1 IMPLANT
GOWN STRL NON-REIN 2XL LVL4 (GOWNS) ×2
GOWN STRL REUS W/ TWL LRG LVL3 (GOWN DISPOSABLE) ×1 IMPLANT
GOWN STRL REUS W/TWL LRG LVL3 (GOWN DISPOSABLE) ×2
PACK KYPHOPLASTY (MISCELLANEOUS) ×3 IMPLANT
RENTAL RFA  GENERATOR (MISCELLANEOUS)
RENTAL RFA GENERATOR (MISCELLANEOUS) IMPLANT
STRAP SAFETY 5IN WIDE (MISCELLANEOUS) ×3 IMPLANT
TRAY KYPHOPAK 15/2 EXPRESS (KITS) ×2 IMPLANT
TRAY KYPHOPAK 15/3 EXPRESS 1ST (MISCELLANEOUS) ×3 IMPLANT
TRAY KYPHOPAK 20/3 EXPRESS 1ST (MISCELLANEOUS) ×3 IMPLANT

## 2018-11-27 NOTE — Op Note (Signed)
Date 11/27/2018  time 3:15 PM   PATIENT:  Lindsey English   PRE-OPERATIVE DIAGNOSIS:  closed wedge compression fracture of T11   POST-OPERATIVE DIAGNOSIS:  closed wedge compression fracture of T11   PROCEDURE:  Procedure(s): KYPHOPLASTY T11  SURGEON: Laurene Footman, MD   ASSISTANTS: None   ANESTHESIA:   local and MAC   EBL:  No intake/output data recorded.   BLOOD ADMINISTERED:none   DRAINS: none    LOCAL MEDICATIONS USED:  MARCAINE    and XYLOCAINE    SPECIMEN:   None   DISPOSITION OF SPECIMEN:  Not applicable   COUNTS:  YES   TOURNIQUET:  * No tourniquets in log *   IMPLANTS: Bone cement   DICTATION: .Dragon Dictation  patient was brought to the operating room and after adequate anesthesia was obtained the patient was placed prone.  C arm was brought in in good visualization of the affected level obtained on both AP and lateral projections.  After patient identification and timeout procedures were completed, local anesthetic was infiltrated with 10 cc 1% Xylocaine infiltrated subcutaneously.  This is done the area on the right side of the planned approach.  The back was then prepped and draped in the usual sterile manner and repeat timeout procedure carried out.  A spinal needle was brought down to the pedicle on the right side of  T11 and a 50-50 mix of 1% Xylocaine half percent Sensorcaine with epinephrine total of 20 cc injected.  After allowing this to set a small incision was made and the trocar was advanced into the vertebral body in an extrapedicular fashion.  Biopsy was not obtained despite attempt Drilling was carried out balloon inserted with inflation to  3 cc.  When the cement was appropriate consistency for cc were injected into the vertebral body without extravasation, good fill superior to inferior endplates and from right to left sides along the inferior endplate.  After the cement had set the trochar was removed and permanent C-arm views obtained.  The wound was  closed with Dermabond followed by Band-Aid   PLAN OF CARE: Continue as inpatient   PATIENT DISPOSITION:  PACU - hemodynamically stable.

## 2018-11-27 NOTE — Anesthesia Post-op Follow-up Note (Signed)
Anesthesia QCDR form completed.        

## 2018-11-27 NOTE — Anesthesia Preprocedure Evaluation (Addendum)
Anesthesia Evaluation  Patient identified by MRN, date of birth, ID band Patient awake    Reviewed: Allergy & Precautions, H&P , NPO status , Patient's Chart, lab work & pertinent test results  Airway Mallampati: II  TM Distance: >3 FB     Dental  (+) Edentulous Lower, Edentulous Upper   Pulmonary shortness of breath, asthma , COPD (reportedly low SpO2 at home nursing checks, does not currently have O2 at home), former smoker,  Baseline SpO2 ~90%    + decreased breath sounds      Cardiovascular hypertension, + Cardiac Stents (20 yrs ago)  (-) Past MI  Rhythm:regular Rate:Normal     Neuro/Psych neg Seizures PSYCHIATRIC DISORDERS Dementia negative neurological ROS     GI/Hepatic Neg liver ROS, hiatal hernia,   Endo/Other  diabetesHyperthyroidism   Renal/GU negative Renal ROS  negative genitourinary   Musculoskeletal   Abdominal   Peds  Hematology negative hematology ROS (+)   Anesthesia Other Findings Past Medical History: No date: Asthma No date: Cancer ([place])     Comment:  Right No date: Diabetes mellitus without complication (HCC) No date: DVT (deep venous thrombosis) (HCC) No date: Hiatal hernia No date: Hyperlipemia No date: Hypertension No date: Hyperthyroidism  Past Surgical History: No date: ABDOMINAL HYSTERECTOMY No date: BREAST SURGERY; Right No date: TOTAL HIP ARTHROPLASTY; Left  BMI    Body Mass Index: 23.59 kg/m      Reproductive/Obstetrics negative OB ROS                           Anesthesia Physical Anesthesia Plan  ASA: III  Anesthesia Plan: General   Post-op Pain Management:    Induction:   PONV Risk Score and Plan: Propofol infusion and TIVA  Airway Management Planned: Natural Airway and Simple Face Mask  Additional Equipment:   Intra-op Plan:   Post-operative Plan:   Informed Consent: I have reviewed the patients History and Physical, chart,  labs and discussed the procedure including the risks, benefits and alternatives for the proposed anesthesia with the patient or authorized representative who has indicated his/her understanding and acceptance.     Dental Advisory Given  Plan Discussed with: Anesthesiologist and CRNA  Anesthesia Plan Comments: (History and consent for anesthesia obtained from son Khari Lett via telephone 11/27/18 @ 1400.  KLF)      Anesthesia Quick Evaluation

## 2018-11-27 NOTE — Transfer of Care (Signed)
Immediate Anesthesia Transfer of Care Note  Patient: Lindsey English  Procedure(s) Performed: KYPHOPLASTY T11 (N/A Back)  Patient Location: PACU  Anesthesia Type:General  Level of Consciousness: awake and sedated  Airway & Oxygen Therapy: Patient Spontanous Breathing and Patient connected to nasal cannula oxygen  Post-op Assessment: Report given to RN and Post -op Vital signs reviewed and stable  Post vital signs: Reviewed and stable  Last Vitals:  Vitals Value Taken Time  BP    Temp    Pulse 79 11/27/18 1511  Resp 14 11/27/18 1511  SpO2 100 % 11/27/18 1511  Vitals shown include unvalidated device data.  Last Pain:  Vitals:   11/27/18 1346  TempSrc: Temporal  PainSc: 0-No pain         Complications: No apparent anesthesia complications

## 2018-11-27 NOTE — Progress Notes (Signed)
   11/27/18 1000  Clinical Encounter Type  Visited With Health care provider  Visit Type Initial  Referral From Nurse;Physician   Chaplain received an OR for the patient. This writer is unable to determine the reason for the consult. No comments were provided for care. The patient's current nurse was unaware of the reason for this OR and confirmed that the patient is confused at this time. Please submit request again with additional details if follow-up is desired.

## 2018-11-27 NOTE — Progress Notes (Addendum)
[facility] [facility] [place] NAME: Lindsey English    MR#:  [id number]  DATE OF BIRTH:  1929-10-28  SUBJECTIVE:    Patient plan for kyphoplasty today Confused REVIEW OF SYSTEMS:    Review of Systems  Constitutional: Negative for fever, chills weight loss HENT: Negative for ear pain, nosebleeds, congestion, facial swelling, rhinorrhea, neck pain, neck stiffness and ear discharge.   Respiratory: Negative for cough, shortness of breath, wheezing  Cardiovascular: Negative for chest pain, palpitations and leg swelling.  Gastrointestinal: Negative for heartburn, abdominal pain, vomiting, diarrhea or consitpation Genitourinary: Negative for dysuria, urgency, frequency, hematuria Musculoskeletal: ++ back pain   Neurological: Negative for dizziness, seizures, syncope, focal weakness,  numbness and headaches.  Hematological: Does not bruise/bleed easily.  Psychiatric/Behavioral: Negative for hallucinations, ++confusion, dysphoric mood    Tolerating Diet: N.p.o.    DRUG ALLERGIES:   Allergies  Allergen Reactions  . Shrimp [Shellfish Allergy]   . Sulfa Antibiotics Nausea Only  . Clindamycin Hcl Rash  . Keflex [Cephalexin] Rash  . 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 rate 18, height 5\' 2"  (1.575 m), weight 58.5 kg, SpO2 96 %.  PHYSICAL EXAMINATION:  Constitutional: Appears well-developed and well-nourished. No distress. HENT: Normocephalic. Marland Kitchen Oropharynx is clear and moist.  Eyes: Conjunctivae and EOM are normal. PERRLA, no scleral icterus.  Neck: Normal ROM. Neck supple. No JVD. No tracheal deviation. CVS: RRR, S1/S2 +, no murmurs, no gallops, no carotid bruit.  Pulmonary: Effort and breath sounds normal, no stridor, rhonchi, wheezes, rales.  Abdominal: Soft. BS +,   no distension, tenderness, rebound or guarding.  Musculoskeletal: No edema and no tenderness.  Neuro: Alert. CN 2-12 grossly intact. No focal deficits. Skin: Skin is warm and dry. LLE skin changes Psychiatric: confused  Back :tendernessT11- L1   LABORATORY PANEL:   CBC Recent Labs  Lab 11/26/18 0521  WBC 9.3  HGB 11.7*  HCT 37.6  PLT 152   ------------------------------------------------------------------------------------------------------------------  Chemistries  Recent Labs  Lab 11/27/18 0610  NA 139  K 4.3  CL 98  CO2 34*  GLUCOSE 103*  BUN 11  CREATININE 0.43*  CALCIUM 8.5*   ------------------------------------------------------------------------------------------------------------------  Cardiac Enzymes No results for input(s): TROPONINI in the last 168 hours. ------------------------------------------------------------------------------------------------------------------  RADIOLOGY:  Dg Chest 1 View  Result Date: 11/27/2018 CLINICAL DATA:  CHF. EXAM: CHEST  1 VIEW COMPARISON:  11/26/2018. FINDINGS: Stable cardiomegaly. Prominent central pulmonary vascularity again noted. Mild bilateral interstitial prominence again noted. Small left pleural effusion cannot be excluded. Mild elevation left hemidiaphragm. No pneumothorax. No acute bony abnormality. IMPRESSION: Cardiomegaly with mild pulmonary venous congestion and mild bilateral interstitial prominence again noted. Small left pleural effusion. Mild CHF could present this fashion. Electronically Signed   By: Marcello Moores  Register   On: 11/27/2018 08:38   Dg Chest 1 View  Result Date: 11/26/2018 CLINICAL DATA:  85 year old female with hypoxia EXAM: CHEST  1 VIEW COMPARISON:  11/25/2018, 05/29/2018 FINDINGS: Cardiomediastinal silhouette unchanged in size and contour. Calcifications of the aortic arch. Low lung volumes with coarsened interstitial markings. Fullness in the central vasculature with mild interlobular septal  thickening. No pneumothorax. No large pleural effusion. No confluent airspace disease. IMPRESSION: Low lung volumes with prominence of the central vasculature and mild interlobular septal thickening, potentially changes of early pulmonary edema  versus chronic changes and prior episodes of CHF. Electronically Signed   By: Corrie Mckusick D.O.   On: 11/26/2018 08:41   Dg Ribs Unilateral W/chest Right  Result Date: 11/25/2018 CLINICAL DATA:  [place].  Right sided chest pain. EXAM: RIGHT RIBS AND CHEST - 3+ VIEW COMPARISON:  None. FINDINGS: The cardiac silhouette, mediastinal and hilar contours are within normal limits. Moderate tortuosity and calcification of the thoracic aorta. The lungs are clear. No pleural effusion or pneumothorax. No worrisome pulmonary lesions. Dedicated views of the right ribs do not demonstrate any definite acute right rib fractures. No pleural thickening or pleural fluid. IMPRESSION: 1. No acute cardiopulmonary findings. 2. No definite acute right-sided rib fractures. Electronically Signed   By: Marijo Sanes M.D.   On: 11/25/2018 17:46   Dg Lumbar Spine 2-3 Views  Result Date: 11/25/2018 CLINICAL DATA:  [place].  Back pain. EXAM: LUMBAR SPINE - 2-3 VIEW COMPARISON:  04/12/2012 FINDINGS: Normal alignment of the lumbar vertebral bodies. Disc spaces and vertebral bodies are maintained. No acute fractures identified. The facets are grossly normally aligned. Extensive vascular calcifications are noted. IMPRESSION: No acute lumbar spine fracture. Electronically Signed   By: Marijo Sanes M.D.   On: 11/25/2018 17:47   Ct Head Wo Contrast  Result Date: 11/25/2018 CLINICAL DATA:  Fall.  Pain. EXAM: CT HEAD WITHOUT CONTRAST CT CERVICAL SPINE WITHOUT CONTRAST TECHNIQUE: Multidetector CT imaging of the head and cervical spine was performed following the standard protocol without intravenous contrast. Multiplanar CT image reconstructions of the cervical spine were also generated. COMPARISON:  None.  FINDINGS: CT HEAD FINDINGS Brain: There is a lacunar infarct in the right cerebellar hemisphere on axial image 5. Another small lacunar infarct is seen in the right cerebellar hemisphere on image 10. Cerebellum, brainstem, and basal cisterns are otherwise normal. Ventricles and sulci are prominent but stable. Chronic white matter changes are identified. No acute cortical ischemia or infarct. No mass effect or midline shift. No subdural, epidural, or subarachnoid hemorrhage. Vascular: No hyperdense vessel or unexpected calcification. Skull: Normal. Negative for fracture or focal lesion. Sinuses/Orbits: No acute finding. Other: None. CT CERVICAL SPINE FINDINGS Alignment: Normal. Skull base and vertebrae: No acute fracture. No primary bone lesion or focal pathologic process. Soft tissues and spinal canal: No prevertebral fluid or swelling. No visible canal hematoma. Disc levels:  Multilevel degenerative changes. Upper chest: Negative. Other: No other abnormalities. IMPRESSION: 1. Chronic white matter changes and lacunar infarcts. No acute intracranial abnormalities identified. 2. No fracture or traumatic malalignment in the cervical spine. Electronically Signed   By: Dorise Bullion III M.D   On: 11/25/2018 18:01   Ct Cervical Spine Wo Contrast  Result Date: 11/25/2018 CLINICAL DATA:  Fall.  Pain. EXAM: CT HEAD WITHOUT CONTRAST CT CERVICAL SPINE WITHOUT CONTRAST TECHNIQUE: Multidetector CT imaging of the head and cervical spine was performed following the standard protocol without intravenous contrast. Multiplanar CT image reconstructions of the cervical spine were also generated. COMPARISON:  None. FINDINGS: CT HEAD FINDINGS Brain: There is a lacunar infarct in the right cerebellar hemisphere on axial image 5. Another small lacunar infarct is seen in the right cerebellar hemisphere on image 10. Cerebellum, brainstem, and basal cisterns are otherwise normal. Ventricles and sulci are prominent but stable. Chronic  white matter changes are identified. No acute cortical ischemia or infarct. No mass effect or midline shift. No subdural, epidural, or subarachnoid hemorrhage. Vascular: No hyperdense vessel or unexpected calcification. Skull: Normal. Negative for fracture or focal lesion. Sinuses/Orbits:  No acute finding. Other: None. CT CERVICAL SPINE FINDINGS Alignment: Normal. Skull base and vertebrae: No acute fracture. No primary bone lesion or focal pathologic process. Soft tissues and spinal canal: No prevertebral fluid or swelling. No visible canal hematoma. Disc levels:  Multilevel degenerative changes. Upper chest: Negative. Other: No other abnormalities. IMPRESSION: 1. Chronic white matter changes and lacunar infarcts. No acute intracranial abnormalities identified. 2. No fracture or traumatic malalignment in the cervical spine. Electronically Signed   By: Dorise Bullion III M.D   On: 11/25/2018 18:01   Mr Lumbar Spine Wo Contrast  Result Date: 11/26/2018 CLINICAL DATA:  Low back pain.  Status post fall today. EXAM: MRI LUMBAR SPINE WITHOUT CONTRAST TECHNIQUE: Multiplanar, multisequence MR imaging of the lumbar spine was performed. No intravenous contrast was administered. COMPARISON:  Plain films lumbar spine 11/25/2018 and 04/12/2012. FINDINGS: Segmentation:  Standard. Alignment:  Maintained. Vertebrae: The patient has a fracture of T11 with associated marrow edema. There is little to no vertebral body height loss. No extension into the posterior elements is identified. No other fracture is seen. Conus medullaris and cauda equina: Conus extends to the L1 level. Conus and cauda equina appear normal. Paraspinal and other soft tissues: Negative. Disc levels: T11-12 is imaged in the sagittal plane only. No bony retropulsion off T11 into the central canal is identified. There is no stenosis. T12-L1: Negative. L1-2: Negative. L2-3: Negative. L3-4: Minimal disc bulge without stenosis. L4-5: Shallow disc bulge and moderate  facet degenerative disease, worse on the right. There is mild central canal narrowing. The foramina are open. L5-S1: Bilateral facet arthropathy and a shallow disc bulge. No stenosis. IMPRESSION: Acute or subacute fracture of T11 is imaged in the sagittal plane only. No involvement of the posterior elements or bony retropulsion off T11 is identified. No other acute abnormality. Mild central canal narrowing at L4-5 where there is a shallow disc bulge. Intervertebral disc spaces are otherwise negative. Electronically Signed   By: Inge Rise M.D.   On: 11/26/2018 12:21   US Venous Img Lower Bilateral  Result Date: 11/26/2018 CLINICAL DATA:  Bilateral lower extremity edema. EXAM: BILATERAL LOWER EXTREMITY VENOUS DOPPLER ULTRASOUND TECHNIQUE: Gray-scale sonography with graded compression, as well as color Doppler and duplex ultrasound were performed to evaluate the lower extremity deep venous systems from the level of the common femoral vein and including the common femoral, femoral, profunda femoral, popliteal and calf veins including the posterior tibial, peroneal and gastrocnemius veins when visible. The superficial great saphenous vein was also interrogated. Spectral Doppler was utilized to evaluate flow at rest and with distal augmentation maneuvers in the common femoral, femoral and popliteal veins. COMPARISON:  Lower extremity duplex 07/08/2016 FINDINGS: RIGHT LOWER EXTREMITY Common Femoral Vein: No evidence of thrombus. Normal compressibility, respiratory phasicity and response to augmentation. Saphenofemoral Junction: No evidence of thrombus. Normal compressibility and flow on color Doppler imaging. Profunda Femoral Vein: No evidence of thrombus. Normal compressibility and flow on color Doppler imaging. Femoral Vein: No evidence of thrombus. Normal compressibility, respiratory phasicity and response to augmentation. Popliteal Vein: No evidence of thrombus. Normal compressibility, respiratory phasicity  and response to augmentation. Calf Veins: No evidence of thrombus in the posterior tibial vein. Normal compressibility and flow on color Doppler imaging. Peroneal vein not well visualized. Patient did not tolerate compression. Superficial Great Saphenous Vein: No evidence of thrombus. Normal compressibility. Venous Reflux:  None. Other Findings:  None. LEFT LOWER EXTREMITY Common Femoral Vein: No evidence of thrombus. Normal compressibility, respiratory phasicity and response  to augmentation. Saphenofemoral Junction: No evidence of thrombus. Normal compressibility and flow on color Doppler imaging. Profunda Femoral Vein: No evidence of thrombus. Normal compressibility and flow on color Doppler imaging. Femoral Vein: No evidence of thrombus. Normal compressibility, respiratory phasicity and response to augmentation. Popliteal Vein: No evidence of thrombus. Normal compressibility, respiratory phasicity and response to augmentation. Calf Veins: No evidence of thrombus of the posterior tibial. Normal compressibility and flow on color Doppler imaging. Peroneal vein not well visualized. Patient did not tolerate compression. Superficial Great Saphenous Vein: No evidence of thrombus. Normal compressibility. Venous Reflux:  None. Other Findings:  None. IMPRESSION: No evidence of deep venous thrombosis in either lower extremity. Electronically Signed   By: Keith Rake M.D.   On: 11/26/2018 01:17   Dg Hip Unilat W Or Wo Pelvis 2-3 Views Right  Result Date: 11/25/2018 CLINICAL DATA:  [place].  Right hip pain. EXAM: DG HIP (WITH OR WITHOUT PELVIS) 2-3V RIGHT COMPARISON:  None. FINDINGS: The left total hip arthroplasty is intact. The right hip demonstrates advanced degenerative changes with joint space narrowing and spurring but no acute fracture is identified. No evidence of AVN. The pubic symphysis and SI joints are intact. No pelvic fractures or bone lesions. IMPRESSION: 1. Intact left total hip arthroplasty. 2. Advanced  right hip joint degenerative changes but no definite acute hip fracture. 3. No definite pelvic fractures. Electronically Signed   By: Marijo Sanes M.D.   On: 11/25/2018 17:40     ASSESSMENT AND PLAN:   85 year old female with a history of diabetes who presented to the ER after mechanical fall and was noted to have hypoxia.  1.  Acute respiratory failure on chronic with hypoxia: Wean oxygen as tolerated Echocardiogram is pending.  2.  Back pain after mechanical fall: Patient for kyphoplasty this morning  3.  Hypokalemia: Replete and recheck in a.m.  4.  Left lower extremity wound present on admission: Wound care consultation appreciated.  5.  Essential hypertension: Continue Norvasc, Coreg, clonidine, hydralazine     Management plans discussed with the patient and she is in agreement.  CODE STATUS: full  TOTAL TIME TAKING CARE OF THIS PATIENT: 24 minutes.     POSSIBLE D/C tomorrow, DEPENDING ON CLINICAL CONDITION.   Bettey Costa M.D on 11/27/2018 at 11:15 AM  Between 7am to 6pm - Pager - [phone] After 6pm go to [email or web address] - password [email or web address] [facility]  Office  [phone]  CC: Primary care physician; Baxter Hire, MD  Note: This dictation was prepared with Dragon dictation along with smaller phrase technology. Any transcriptional errors that result from this process are unintentional.

## 2018-11-27 NOTE — Progress Notes (Signed)
Son has talked with his family and is decided to proceed with surgery.  Plan on kyphoplasty later today.

## 2018-11-27 NOTE — Progress Notes (Signed)
PT Cancellation Note  Patient Details Name: Lindsey English MRN: [id number] DOB: [birth year]   Cancelled Treatment:    Reason Eval/Treat Not Completed: Medical issues which prohibited therapy(Per chart review, patient now recommended for kyphoplasty for management of severe pain related to T11 compression fracture.  Family to discuss and determine plan/goals of care.  Will continue to hold at this time and re-attempt as medically appropriate.)   Demosthenes Virnig H. Owens Shark, PT, DPT, NCS 11/27/18, 9:23 AM [phone]

## 2018-11-28 ENCOUNTER — Encounter: Payer: Self-pay | Admitting: Orthopedic Surgery

## 2018-11-28 MED ORDER — TRAMADOL HCL 50 MG PO TABS: 50.0000 mg | ORAL_TABLET | Freq: Four times a day (QID) | ORAL | Status: DC

## 2018-11-28 MED ORDER — LIDOCAINE 5 % EX PTCH: 1.0000 | MEDICATED_PATCH | CUTANEOUS | 0 refills | Status: AC

## 2018-11-28 MED ORDER — HYDRALAZINE HCL 100 MG PO TABS: 100.0000 mg | ORAL_TABLET | Freq: Two times a day (BID) | ORAL | Status: AC

## 2018-11-28 NOTE — Evaluation (Signed)
Physical Therapy Evaluation Patient Details Name: Lindsey English MRN: [id number] DOB: 19-Dec-1929 Today's Date: 11/28/2018   History of Present Illness  presented to ER after mechanical fall in home environment, acute onset of LBP; admitted for management of T11 compression fracture, s/p kyphoplasty 7/7  Clinical Impression  Patient sleeping upon arrival to room, awakens to mod voice/touch from therapist.  Generally confused, oriented to self only despite reorientation to situation, location.  Requiring act assist from therapist for movement of all extremities, constantly grimacing, voicing "it hurts" and "don't hurt me".  Agreeable for transition to unsupported sitting for position change and attempt at pain relief, mod/max assist to achieve; min assist to maintain unsupported sitting balance.  Unable to find position of relief, with patient ultimately requesting return to supine (max/total assist to complete).  Repositioned to comfort; RN informed/aware of pain complaints.  Will continue to assess/progress mobility in subsequent sessions as medically appropriate and patient tolerates. Of note, vitals stable and WFL on 3L supplemental O2; however, mod SOB/dyspnea at rest and with exertion. Would benefit from skilled PT to address above deficits and promote optimal return to PLOF; recommend transition to STR upon discharge from acute hospitalization pending ability to actively participate and progress.  May benefit from palliative care consult.     Follow Up Recommendations SNF(pending ability to actively participate/progress)    Equipment Recommendations       Recommendations for Other Services (palliative consult)     Precautions / Restrictions Precautions Precautions: Fall;Back Restrictions Weight Bearing Restrictions: No      Mobility  Bed Mobility Overal bed mobility: Needs Assistance Bed Mobility: Supine to Sit;Sit to Supine     Supine to sit: Mod assist Sit to supine: Max  assist   General bed mobility comments: assist for trunk control/positioning; unsupported sitting, min assist for safety  Transfers                 General transfer comment: unsafe/unable to tolerate due to pain  Ambulation/Gait             General Gait Details: unsafe/unable to tolerate due to pain  Stairs            Wheelchair Mobility    Modified Rankin (Stroke Patients Only)       Balance Overall balance assessment: Needs assistance Sitting-balance support: No upper extremity supported;Feet supported Sitting balance-Leahy Scale: Fair         Standing balance comment: unsafe/unable to tolerate due to pain                             Pertinent Vitals/Pain Pain Assessment: Faces Faces Pain Scale: Hurts whole lot Pain Location: back Pain Descriptors / Indicators: Aching;Guarding;Grimacing Pain Intervention(s): Limited activity within patient's tolerance;Monitored during session;Repositioned;Patient requesting pain meds-RN notified    Home Living Family/patient expects to be discharged to:: Private residence Living Arrangements: Children Available Help at Discharge: Family Type of Home: House Home Access: Ramped entrance     Home Layout: Two level;Able to live on main level with bedroom/bathroom   Additional Comments: Patient poor historian and unable to provide reliable information; history gleaned from previous documenation (Jan 2020)    Prior Function Level of Independence: Needs assistance         Comments: Ambulatory for household distances with RW, son assisting with ADLs, household chores and community errands/needs.     Hand Dominance   Dominant Hand: Right    Extremity/Trunk  Assessment   Upper Extremity Assessment Upper Extremity Assessment: Generalized weakness    Lower Extremity Assessment Lower Extremity Assessment: Generalized weakness(grossly 3-/5 throughout, guarded/limited by pain)        Communication   Communication: HOH  Cognition Arousal/Alertness: Awake/alert Behavior During Therapy: Anxious;Restless Overall Cognitive Status: No family/caregiver present to determine baseline cognitive functioning                                 General Comments: oriented to self only      General Comments      Exercises     Assessment/Plan    PT Assessment Patient needs continued PT services  PT Problem List Decreased strength;Decreased range of motion;Decreased activity tolerance;Decreased balance;Decreased mobility;Decreased coordination;Decreased cognition;Decreased knowledge of use of DME;Decreased safety awareness;Decreased knowledge of precautions;Pain;Cardiopulmonary status limiting activity       PT Treatment Interventions DME instruction;Gait training;Therapeutic activities;Functional mobility training;Therapeutic exercise;Balance training;Patient/family education    PT Goals (Current goals can be found in the Care Plan section)  Acute Rehab PT Goals PT Goal Formulation: Patient unable to participate in goal setting Time For Goal Achievement: 12/12/18 Potential to Achieve Goals: Fair    Frequency 7X/week   Barriers to discharge Decreased caregiver support      Co-evaluation               AM-PAC PT "6 Clicks" Mobility  Outcome Measure Help needed turning from your back to your side while in a flat bed without using bedrails?: A Lot Help needed moving from lying on your back to sitting on the side of a flat bed without using bedrails?: A Lot Help needed moving to and from a bed to a chair (including a wheelchair)?: Total Help needed standing up from a chair using your arms (e.g., wheelchair or bedside chair)?: Total Help needed to walk in hospital room?: Total Help needed climbing 3-5 steps with a railing? : Total 6 Click Score: 8    End of Session Equipment Utilized During Treatment: Gait belt Activity Tolerance: Patient limited by  pain Patient left: in bed;with call bell/phone within reach;with bed alarm set Nurse Communication: Mobility status PT Visit Diagnosis: Difficulty in walking, not elsewhere classified (R26.2);Muscle weakness (generalized) (M62.81);Pain    Time: 1450-1510 PT Time Calculation (min) (ACUTE ONLY): 20 min   Charges:   PT Evaluation $PT Eval Moderate Complexity: 1 Mod          Shady Bradish H. Owens Shark, PT, DPT, NCS 11/28/18, 4:09 PM [phone]

## 2018-11-28 NOTE — TOC Progression Note (Signed)
Transition of Care [facility]) - Progression Note    Patient Details  Name: Lindsey English MRN: [id number] Date of Birth: Aug 21, 1929  [facility] CM/SW Contact  Tierra Divelbiss, Lenice Llamas Phone Number: [phone]  11/28/2018, 3:32 PM  Clinical Narrative: PT is recommending SNF and palliative consult. Clinical Social Worker (CSW) made MD aware of above. MD ordered outpatient palliative to follow patient. CSW contacted patient's son [name] and made him aware of above. Son is agreeable to SNF. CSW presented bed offers to son and he chose [facility]. [facility] liaison is aware of above and started [facility] SN[facility] authorization. Son is also agreeable to outpatient palliative. [facility] liaison is aware of above. CSW will continue to follow and assist as needed.      Expected Discharge Plan: [place] Barriers to Discharge: Continued Medical Work up  Expected Discharge Plan and Services Expected Discharge Plan: [place] In-house Referral: Clinical Social Work Discharge Planning Services: CM Consult   Living arrangements for the past 2 months: [place] Expected Discharge Date: 11/28/18                         HH Arranged: PT, RN [place] Agency: [facility] Date [place]: 11/26/18   Representative spoke with at [place]: Cassie   Social Determinants of Health ([place]) Interventions    Readmission Risk Interventions No flowsheet data found.

## 2018-11-28 NOTE — Plan of Care (Signed)
Pt. is s/p kyphoplasty on 7/7. Dressing is clean,dry,and intact. Dressing change to lower extremity. Denies shortness of breath. No other issues identified at this time.

## 2018-11-28 NOTE — Anesthesia Postprocedure Evaluation (Signed)
Anesthesia Post Note  Patient: Lindsey English  Procedure(s) Performed: KYPHOPLASTY T11 (N/A Back)  Patient location during evaluation: PACU Anesthesia Type: General Level of consciousness: awake and alert Pain management: pain level controlled Vital Signs Assessment: post-procedure vital signs reviewed and stable Respiratory status: spontaneous breathing, nonlabored ventilation and respiratory function stable Cardiovascular status: blood pressure returned to baseline and stable Postop Assessment: no apparent nausea or vomiting Anesthetic complications: no     Last Vitals:  Vitals:   11/27/18 1607 11/27/18 2307  BP: 132/63 (!) 127/56  Pulse: 83 82  Resp: 16 19  Temp: 36.5 C 37 C  SpO2: 97% 97%    Last Pain:  Vitals:   11/27/18 2307  TempSrc: Oral  PainSc:                  [name]

## 2018-11-28 NOTE — Progress Notes (Signed)
[place] [facility] [place] NAME: Lindsey English    MR#:  [id number]  DATE OF BIRTH:  Aug 10, 1929  SUBJECTIVE:    Patient currently sleeping Received Tylenol this morning for pain.   REVIEW OF SYSTEMS:    Unable to obtain as patient is sleeping   Tolerating Diet: yes  DRUG ALLERGIES:   Allergies  Allergen Reactions  . Shrimp [Shellfish Allergy]   . Sulfa Antibiotics Nausea Only  . Clindamycin Hcl Rash  . Keflex [Cephalexin] Rash  . 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 rate 18, height 5\' 2"  (1.575 m), weight 58.5 kg, SpO2 96 %.  PHYSICAL EXAMINATION:  Constitutional: Appears well-developed and well-nourished. No distress. HENT: Normocephalic. . .  Eyes: no scleral icterus.  Neck: Normal ROM. Neck supple. No JVD. No tracheal deviation. CVS: RRR, S1/S2 +, no murmurs, no gallops, no carotid bruit.  Pulmonary: Effort and breath sounds normal, no stridor, rhonchi, wheezes, rales.  Abdominal: Soft. BS +,  no distension, tenderness, rebound or guarding.  Musculoskeletal: No edema and no tenderness.  Neuro: sleeping  Skin: Skin is warm and dry. LLE skin changes/venous insufficiency Psychiatric: sleeping   LABORATORY PANEL:   CBC Recent Labs  Lab 11/26/18 0521  WBC 9.3  HGB 11.7*  HCT 37.6  PLT 152   ------------------------------------------------------------------------------------------------------------------  Chemistries  Recent Labs  Lab 11/27/18 0610  NA 139  K 4.3  CL 98  CO2 34*  GLUCOSE 103*  BUN 11  CREATININE 0.43*  CALCIUM 8.5*   ------------------------------------------------------------------------------------------------------------------  Cardiac Enzymes No results for input(s): TROPONINI in the last 168  hours. ------------------------------------------------------------------------------------------------------------------  RADIOLOGY:  Dg Chest 1 View  Result Date: 11/27/2018 CLINICAL DATA:  CHF. EXAM: CHEST  1 VIEW COMPARISON:  11/26/2018. FINDINGS: Stable cardiomegaly. Prominent central pulmonary vascularity again noted. Mild bilateral interstitial prominence again noted. Small left pleural effusion cannot be excluded. Mild elevation left hemidiaphragm. No pneumothorax. No acute bony abnormality. IMPRESSION: Cardiomegaly with mild pulmonary venous congestion and mild bilateral interstitial prominence again noted. Small left pleural effusion. Mild CHF could present this fashion. Electronically Signed   By: Marcello Moores  Register   On: 11/27/2018 08:38   Dg Thoracic Spine 2 View  Result Date: 11/27/2018 CLINICAL DATA:  T11 kyphoplasty. EXAM: THORACIC SPINE 2 VIEWS COMPARISON:  MRI of the lumbosacral spine 11/26/2018 FINDINGS: Fluoroscopic images from vertebroplasty of T11 demonstrate placement of radiodense material with improvement of the height of the vertebral body. Fluoroscopy time is recorded as 1 minutes 29 seconds. IMPRESSION: Post T11 vertebroplasty. Electronically Signed   By: Fidela Salisbury M.D.   On: 11/27/2018 15:26   Mr Lumbar Spine Wo Contrast  Result Date: 11/26/2018 CLINICAL DATA:  Low back pain.  Status post fall today. EXAM: MRI LUMBAR SPINE WITHOUT CONTRAST TECHNIQUE: Multiplanar, multisequence MR imaging of the lumbar spine was performed. No intravenous contrast was administered. COMPARISON:  Plain films lumbar spine 11/25/2018 and 04/12/2012. FINDINGS: Segmentation:  Standard. Alignment:  Maintained. Vertebrae: The patient has a fracture of T11 with associated marrow edema. There is little to no vertebral body height loss. No extension into the posterior elements is identified. No other fracture is seen. Conus medullaris and cauda equina: Conus extends to the L1 level. Conus and cauda  equina appear normal. Paraspinal and other soft tissues: Negative. Disc  levels: T11-12 is imaged in the sagittal plane only. No bony retropulsion off T11 into the central canal is identified. There is no stenosis. T12-L1: Negative. L1-2: Negative. L2-3: Negative. L3-4: Minimal disc bulge without stenosis. L4-5: Shallow disc bulge and moderate facet degenerative disease, worse on the right. There is mild central canal narrowing. The foramina are open. L5-S1: Bilateral facet arthropathy and a shallow disc bulge. No stenosis. IMPRESSION: Acute or subacute fracture of T11 is imaged in the sagittal plane only. No involvement of the posterior elements or bony retropulsion off T11 is identified. No other acute abnormality. Mild central canal narrowing at L4-5 where there is a shallow disc bulge. Intervertebral disc spaces are otherwise negative. Electronically Signed   By: Inge Rise M.D.   On: 11/26/2018 12:21   Dg C-arm 1-60 Min  Result Date: 11/27/2018 CLINICAL DATA:  T11 kyphoplasty. EXAM: DG C-ARM 61-120 MIN COMPARISON:  MRI of the lumbosacral spine 11/26/2018 FINDINGS: Fluoroscopic images from T11 vertebroplasty demonstrate placement of radiodense material within the vertebral body with improved vertebral body height. Fluoroscopy time is recorded as 1 minutes 29 seconds. IMPRESSION: Intraoperative fluoroscopic images from T11 vertebroplasty. Electronically Signed   By: Fidela Salisbury M.D.   On: 11/27/2018 15:41     ASSESSMENT AND PLAN:   85 year old female with a history of diabetes who presented to the ER after mechanical fall and was noted to have hypoxia.  1.  Acute respiratory failure on chronic with hypoxia: Wean oxygen as tolerated Echocardiogram shows no evidence of systolic or diastolic dysfunction.  2.  Back pain after mechanical fall: Patient is postoperative day #1 kyphoplasty. When patient more awake PT consultation to see patient  3.  Hypokalemia: Replete PRN  4.  Left lower  extremity wound present on admission: Continue using white petroleum to provide continually moist wound care.  5.  Essential hypertension: Continue Norvasc, Coreg, clonidine, hydralazine     Management plans discussed with family CODE STATUS: full  TOTAL TIME TAKING CARE OF THIS PATIENT: 24 minutes.     POSSIBLE D/C tomorrow, DEPENDING ON CLINICAL CONDITION.   Bettey Costa M.D on 11/28/2018 at 11:35 AM  Between 7am to 6pm - Pager - [phone] After 6pm go to [email or web address] - password [email or web address] [place] [facility]  Office  [phone]  CC: Primary care physician; Baxter Hire, MD  Note: This dictation was prepared with Dragon dictation along with smaller phrase technology. Any transcriptional errors that result from this process are unintentional.

## 2018-11-28 NOTE — Progress Notes (Signed)
New referral for palliative care at [facility] received from [facility].  Patient is going to rehab on skilled days.  Plans for discharge tomorrow.  Referral discussed with Wendie Simmer who will pull information from epic.  Dimas Aguas, RN Clinical nurse liaison [facility] [phone]

## 2018-11-28 NOTE — TOC Progression Note (Signed)
Transition of Care [facility]) - Progression Note    Patient Details  Name: Lindsey English MRN: [id number] Date of Birth: June 26, 1929  Transition of Care [facility]) CM/SW Contact  Lakiesha Ralphs, Lenice Llamas Phone Number: [phone]  11/28/2018, 2:07 PM  Clinical Narrative: Clinical Social Worker (CSW) contacted patient's son [name] and made him aware that patient has not been able to work with PT today. Per [name] he will consider SNF and prefers [facility]. [name] is aware that [facility] will have to approve SNF. Per [name] patient has a lift chair and and he can get a wheel chair if needed for home use. FL2 complete. Per [name] he prefers to take patient home. CSW will continue to follow and assist as needed.     Expected Discharge Plan: [place] Barriers to Discharge: Continued Medical Work up  Expected Discharge Plan and Services Expected Discharge Plan: [place] In-house Referral: Clinical Social Work Discharge Planning Services: CM Consult   Living arrangements for the past 2 months: [place] Expected Discharge Date: 11/28/18                         HH Arranged: PT, RN [place] Agency: [facility] Date [place]: 11/26/18   Representative spoke with at [place]: Cassie   Social Determinants of Health ([place]) Interventions    Readmission Risk Interventions No flowsheet data found.

## 2018-11-28 NOTE — NC FL2 (Signed)
[place] LEVEL OF CARE SCREENING TOOL     IDENTIFICATION  Patient Name: Lindsey English Birthdate: 1929-06-09 Sex: female Admission Date (Current Location): 11/25/2018  [place] and [place] Number:  Engineering geologist and Address:  [facility], [address] [place], [place] [address]      Provider Number: [id number]  Attending Physician Name and Address:  Bettey Costa, MD  Relative Name and Phone Number:       Current Level of Care: Hospital Recommended Level of Care: [place] Prior Approval Number:    Date Approved/Denied:   PASRR Number: [id number]  Discharge Plan: SNF    Current Diagnoses: Patient Active Problem List   Diagnosis Date Noted  . Acute on chronic respiratory failure ([place]) 11/25/2018  . Chronic venous insufficiency 06/18/2018  . Hyperlipemia 06/18/2018  . Essential hypertension 06/18/2018  . Malnutrition of moderate degree 06/01/2018  . Failure to thrive (0-17) 05/30/2018  . Dementia ([place]) 11/09/2016  . Lymphedema 04/20/2016  . Ulcer of ankle, left, limited to breakdown of skin ([place]) 04/20/2016  . Ulcer of ankle, right, limited to breakdown of skin ([place]) 04/20/2016  . Cellulitis 02/29/2016    Orientation RESPIRATION BLADDER Height & Weight     Self, Place  O2(1 Liter Oxygen.) Continent Weight: 128 lb 15.5 oz (58.5 kg) Height:  5\' 2"  (157.5 cm)  BEHAVIORAL SYMPTOMS/MOOD NEUROLOGICAL BOWEL NUTRITION STATUS      Continent Diet(Diet: Regular)  AMBULATORY STATUS COMMUNICATION OF NEEDS Skin   Extensive Assist Verbally Surgical wounds, PU Stage and Appropriate Care(Incision: Back. Pressure Ulcer Stage 1 on Coccyx.)                       Personal Care Assistance Level of Assistance  Bathing, Feeding, Dressing Bathing Assistance: Limited assistance Feeding assistance: Independent Dressing Assistance: Limited assistance     Functional Limitations Info  Sight, Hearing, Speech Sight Info:  Adequate Hearing Info: Adequate Speech Info: Adequate    SPECIAL CARE FACTORS FREQUENCY  PT (By licensed PT), OT (By licensed OT)     PT Frequency: 5 OT Frequency: 5            Contractures      Additional Factors Info  Code Status, Allergies Code Status Info: Full Code. Allergies Info: Shrimp Shellfish Allergy, Sulfa Antibiotics, Clindamycin Hcl, Keflex Cephalexin, Vancomycin           Current Medications (11/28/2018):  This is the current hospital active medication list Current Facility-Administered Medications  Medication Dose Route Frequency Provider Last Rate Last Dose  . acetaminophen (TYLENOL) tablet 650 mg  650 mg Oral Q6H PRN Hessie Knows, MD   650 mg at 11/28/18 0740   Or  . acetaminophen (TYLENOL) suppository 650 mg  650 mg Rectal Q6H PRN Hessie Knows, MD      . amLODipine (NORVASC) tablet 10 mg  10 mg Oral Daily Hessie Knows, MD   10 mg at 11/27/18 0842  . aspirin EC tablet 81 mg  81 mg Oral Daily Hessie Knows, MD   81 mg at 11/28/18 0804  . carvedilol (COREG) tablet 3.125 mg  3.125 mg Oral BID Hessie Knows, MD   3.125 mg at 11/28/18 0017  . clindamycin (CLEOCIN) IVPB 600 mg  600 mg Intravenous Once Hessie Knows, MD      . cloNIDine (CATAPRES) tablet 0.1 mg  0.1 mg Oral BID Hessie Knows, MD   0.1 mg at 11/28/18 0016  . divalproex (DEPAKOTE)  DR tablet 250 mg  250 mg Oral BID Hessie Knows, MD   250 mg at 11/28/18 0016  . docusate sodium (COLACE) capsule 100 mg  100 mg Oral BID Hessie Knows, MD   100 mg at 11/28/18 0804  . enoxaparin (LOVENOX) injection 40 mg  40 mg Subcutaneous Q24H Mody, Sital, MD   40 mg at 11/28/18 0813  . hydrALAZINE (APRESOLINE) tablet 100 mg  100 mg Oral BID Hessie Knows, MD   100 mg at 11/28/18 0017  . lidocaine (LIDODERM) 5 % 1 patch  1 patch Transdermal Q24H Hessie Knows, MD   1 patch at 11/27/18 1833  . metoCLOPramide (REGLAN) tablet 5-10 mg  5-10 mg Oral Q8H PRN Hessie Knows, MD       Or  . metoCLOPramide (REGLAN) injection  5-10 mg  5-10 mg Intravenous Q8H PRN Hessie Knows, MD      . morphine 2 MG/ML injection 1 mg  1 mg Intravenous Q1H PRN Hessie Knows, MD      . multivitamin with minerals tablet 1 tablet  1 tablet Oral Daily Hessie Knows, MD   1 tablet at 11/28/18 0804  . ondansetron (ZOFRAN) tablet 4 mg  4 mg Oral Q6H PRN Hessie Knows, MD       Or  . ondansetron [facility]) injection 4 mg  4 mg Intravenous Q6H PRN Hessie Knows, MD      . potassium chloride SA (K-DUR) CR tablet 20 mEq  20 mEq Oral Daily Hessie Knows, MD   20 mEq at 11/28/18 0804  . potassium chloride SA (K-DUR) CR tablet 40 mEq  40 mEq Oral Once Hessie Knows, MD      . simvastatin (ZOCOR) tablet 10 mg  10 mg Oral QHS Hessie Knows, MD   10 mg at 11/27/18 2108  . sodium chloride flush (NS) 0.9 % injection 3 mL  3 mL Intravenous Q12H Hessie Knows, MD   3 mL at 11/28/18 0814  . traMADol (ULTRAM) tablet 50 mg  50 mg Oral Q6H Hessie Knows, MD   50 mg at 11/27/18 2109     Discharge Medications: Please see discharge summary for a list of discharge medications.  Relevant Imaging Results:  Relevant Lab Results:   Additional Information SSN: [id number]  Joycelynn Fritsche, [place] Beets, LCSW

## 2018-11-28 NOTE — Progress Notes (Addendum)
This RN was administering scheduled medications at 12:00. Pt had been weaned to 1L O2 via [place] this morning and was 93%. Pt started repeating, "I can't breathe." Her oxygen saturations were 82% on 1L. Oxygen was increased to 5L  until pt recovered and RT called to assess pt, and then decreased to 3L; pt was maintaining sats at 97% on 3L. Dr. Benjie Karvonen notified of event, no orders received.

## 2018-11-29 LAB — ABO/RH: ABO/RH(D): A POS

## 2018-11-29 LAB — SARS CORONAVIRUS 2 BY RT PCR (HOSPITAL ORDER, PERFORMED IN ~~LOC~~ HOSPITAL LAB): SARS Coronavirus 2: NEGATIVE

## 2018-11-29 MED ORDER — TRAMADOL HCL 50 MG PO TABS: 50.0000 mg | ORAL_TABLET | Freq: Four times a day (QID) | ORAL | 0 refills | Status: AC

## 2018-11-29 NOTE — Progress Notes (Signed)
Report called to Kim at [employer]. All belongings packed. Pt ready for EMS transport. VSS. PIV removed.

## 2018-11-29 NOTE — TOC Progression Note (Signed)
Transition of Care [facility]) - Progression Note    Patient Details  Name: Lindsey English MRN: [id number] Date of Birth: 05/19/1930  Transition of Care [facility]) CM/SW Contact  Lanore Renderos, Lenice Llamas Phone Number: [phone]  11/29/2018, 8:46 AM  Clinical Narrative: Per Otila [facility] liaison they require another covid test for patient. Per [place] SNF authorization is still pending. CSW sent MD a secure message requesting a rapid covid test to be ordered.     Expected Discharge Plan: [place] Barriers to Discharge: Continued Medical Work up  Expected Discharge Plan and Services Expected Discharge Plan: [place] In-house Referral: Clinical Social Work Discharge Planning Services: CM Consult   Living arrangements for the past 2 months: [place] Expected Discharge Date: 11/28/18                         HH Arranged: PT, RN [place] Agency: [facility] Date [place]: 11/26/18   Representative spoke with at [place]: Cassie   Social Determinants of Health ([place]) Interventions    Readmission Risk Interventions No flowsheet data found.

## 2018-11-29 NOTE — Progress Notes (Signed)
Physical Therapy Treatment Patient Details Name: Lindsey English MRN: [id number] DOB: 1929-08-04 Today's Date: 11/29/2018    History of Present Illness presented to ER after mechanical fall in home environment, acute onset of LBP; admitted for management of T11 compression fracture, s/p kyphoplasty 7/7    PT Comments    Pt in bed, agrees to session.  To edge of bed with mod/max a x 1.  She is able to sit with min guard x 7 minutes with reports of back pain.  RN in and meds given in sitting.  Returned to supine with max a dependant +1.  Repositioned for comfort.  Remains limited due to pain.   Follow Up Recommendations  SNF     Equipment Recommendations       Recommendations for Other Services       Precautions / Restrictions Precautions Precautions: Fall;Back Restrictions Weight Bearing Restrictions: No    Mobility  Bed Mobility Overal bed mobility: Needs Assistance Bed Mobility: Supine to Sit;Sit to Supine     Supine to sit: Mod assist;Max assist Sit to supine: Max assist      Transfers                 General transfer comment: unsafe/unable to tolerate due to pain  Ambulation/Gait             General Gait Details: unsafe/unable to tolerate due to pain   Stairs             Wheelchair Mobility    Modified Rankin (Stroke Patients Only)       Balance Overall balance assessment: Needs assistance Sitting-balance support: No upper extremity supported;Feet supported Sitting balance-Leahy Scale: Fair Sitting balance - Comments: min guard for safety due to pain       Standing balance comment: unsafe/unable to tolerate due to pain                            Cognition Arousal/Alertness: Awake/alert Behavior During Therapy: Anxious;Restless Overall Cognitive Status: No family/caregiver present to determine baseline cognitive functioning                                        Exercises      General Comments         Pertinent Vitals/Pain Pain Assessment: Faces Faces Pain Scale: Hurts whole lot Pain Location: back Pain Descriptors / Indicators: Aching;Guarding;Grimacing Pain Intervention(s): RN gave pain meds during session;Limited activity within patient's tolerance;Monitored during session    Home Living                      Prior Function            PT Goals (current goals can now be found in the care plan section) Progress towards PT goals: Progressing toward goals    Frequency    7X/week      PT Plan Current plan remains appropriate    Co-evaluation              AM-PAC PT "6 Clicks" Mobility   Outcome Measure  Help needed turning from your back to your side while in a flat bed without using bedrails?: A Lot Help needed moving from lying on your back to sitting on the side of a flat bed without using bedrails?: A Lot Help needed moving to and  from a bed to a chair (including a wheelchair)?: Total Help needed standing up from a chair using your arms (e.g., wheelchair or bedside chair)?: Total Help needed to walk in hospital room?: Total Help needed climbing 3-5 steps with a railing? : Total 6 Click Score: 8    End of Session Equipment Utilized During Treatment: Gait belt Activity Tolerance: Patient limited by pain Patient left: in bed;with call bell/phone within reach;with bed alarm set Nurse Communication: Mobility status       Time: 0953-1003 PT Time Calculation (min) (ACUTE ONLY): 10 min  Charges:  $Therapeutic Activity: 8-22 mins                     Chesley Noon, PTA 11/29/18, 10:26 AM

## 2018-11-29 NOTE — TOC Transition Note (Addendum)
Transition of Care [facility]) - CM/SW Discharge Note   Patient Details  Name: Lindsey English MRN: [id number] Date of Birth: Feb 06, 1930  Transition of Care [facility]) CM/SW Contact:  Lindsey English, Lindsey English Phone Number: [phone]  11/29/2018, 11:52 AM   Clinical Narrative: Patient is medically stable for D/C to [facility] today. Per Lindsey English [facility] liaison Lindsey English SNF authorization has been received and covid test negative today 7/9. RN will call report and arrange EMS for transport. Clinical Education officer, museum (CSW) sent D/C orders to [facility] via [facility]. CSW contacted patient's son Lindsey English and made him aware of above. MD ordered outpatient palliative. Lindsey English liaison is aware of above. Please reconsult if future social work needs arise. CSW signing off.     Final next level of care: Skilled Nursing Facility Barriers to Discharge: Barriers Resolved   Patient Goals and CMS Choice     Choice offered to / list presented to : Adult Children  Discharge Placement   Existing PASRR number confirmed : 11/28/18          [facility] at: [facility] [place] Patient to be transferred to facility by: Lindsey English [facility] Name of family member notified: Patient's son Lindsey English is aware of D/C today. Patient and family notified of of transfer: 11/29/18  Discharge Plan and Services In-house Referral: Clinical Social Work Discharge Planning Services: CM Consult                      HH Arranged: PT, RN [place] Agency: Lindsey English Date [place]: 11/26/18   Representative spoke with at [place]: Lindsey English  Social Determinants of English ([place]) Interventions     Readmission Risk Interventions No flowsheet data found.

## 2018-11-29 NOTE — Care Management Important Message (Signed)
Important Message  Patient Details  Name: Lindsey English MRN: [id number] Date of Birth: 11-01-1929   Medicare Important Message Given:  Yes     [name] 11/29/2018, 11:24 AM

## 2018-11-29 NOTE — Progress Notes (Signed)
Notified son that EMS has picked patient up for transport

## 2018-11-29 NOTE — Discharge Summary (Signed)
[facility] NAME: Lindsey English    MR#:  [id number]  DATE OF BIRTH:  08-Feb-1930  DATE OF ADMISSION:  11/25/2018 ADMITTING PHYSICIAN: Christel Mormon, MD  DATE OF DISCHARGE: 11/29/2018  PRIMARY CARE PHYSICIAN: Baxter Hire, MD    ADMISSION DIAGNOSIS:  Hypoxia [R09.02] Bilateral lower extremity edema [R60.0] Fall, initial encounter [W19.XXXA]  DISCHARGE DIAGNOSIS:  Active Problems:   Acute on chronic respiratory failure ([place])   SECONDARY DIAGNOSIS:   Past Medical History:  Diagnosis Date  . Asthma   . Cancer ([place])    Right  . Diabetes mellitus without complication ([place])   . DVT (deep venous thrombosis) ([place])   . Hiatal hernia   . Hyperlipemia   . Hypertension   . Hyperthyroidism     HOSPITAL COURSE:   85 year old female with a history of diabetes who presented to the ER after mechanical fall and was noted to have hypoxia.  1.  Acute respiratory failure on chronic due to hypoxia:  Echocardiogram shows no evidence of systolic or diastolic dysfunction. She will need o2 at discharge.  2.  Back pain after mechanical fall: Patient is postoperative day #2 kyphoplasty. Continue supportive management, PT and PRN pain medications.  3.  Hypokalemia: Repleted PRN  4.  Left lower extremity wound present on admission: Continue using white petroleum to provide continually moist wound care.  5.  Essential hypertension: Continue Norvasc, Coreg, clonidine, hydralazine  Patient palliative care service is recommended. DISCHARGE CONDITIONS AND DIET:   Stable Regular diet  CONSULTS OBTAINED:  Treatment Team:  Hessie Knows, MD  DRUG ALLERGIES:   Allergies  Allergen Reactions  . Shrimp [Shellfish Allergy]   . Sulfa Antibiotics Nausea Only  . Clindamycin Hcl Rash  . Keflex [Cephalexin] Rash  . 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   aspirin EC 81 MG tablet Take 81 mg by mouth daily.   carvedilol 3.125 MG tablet Commonly known as: Coreg Take 1 tablet (3.125 mg total) by mouth 2 (two) times daily.   cloNIDine 0.1 MG tablet Commonly known as: CATAPRES Take 0.1 mg by mouth 2 (two) times daily.   divalproex 250 MG DR tablet Commonly known as: DEPAKOTE Take 250 mg by mouth 2 (two) times a day.   furosemide 40 MG tablet Commonly known as: LASIX Take 40 mg by mouth daily.   hydrALAZINE 100 MG tablet Commonly known as: APRESOLINE Take 1 tablet (100 mg total) by mouth 2 (two) times daily.   lidocaine 5 % Commonly known as: LIDODERM Place 1 patch onto the skin daily. Remove & Discard patch within 12 hours or as directed by MD   multivitamin with minerals Tabs tablet Take 1 tablet by mouth daily.   potassium chloride 10 MEQ CR capsule Commonly known as: MICRO-K Take 10 mEq by mouth 2 (two) times a day.   simvastatin 10 MG  tablet Commonly known as: ZOCOR Take 10 mg by mouth at bedtime.   traMADol 50 MG tablet Commonly known as: ULTRAM Take 1 tablet (50 mg total) by mouth every 6 (six) hours.            Durable Medical Equipment  (From admission, onward)         Start     Ordered   11/28/18 0940  For home use only DME Gilford Rile  [facility])  Once    Question:  Patient needs a walker to treat with the following condition  Answer:  Weakness   11/28/18 0940   11/28/18 0940  DME Oxygen  Once    Question Answer Comment  Length of Need Lifetime   Mode or (Route) Nasal cannula    Liters per Minute 2   Frequency Continuous (stationary and portable oxygen unit needed)   Oxygen conserving device Yes   Oxygen delivery system Gas      11/28/18 0940            Today   CHIEF COMPLAINT:   Acute issues overnight.   VITAL SIGNS:  Blood pressure 139/71, pulse 81, temperature (!) 97.4 F (36.3 C), temperature source Oral, resp. rate 16, height 5\' 2"  (1.575 m), weight 58.5 kg, SpO2 98 %.   REVIEW OF SYSTEMS:  Review of Systems  Constitutional: Negative.  Negative for chills, fever and malaise/fatigue.  HENT: Negative.  Negative for ear discharge, ear pain, hearing loss, nosebleeds and sore throat.   Eyes: Negative.  Negative for blurred vision and pain.  Respiratory: Negative.  Negative for cough, hemoptysis, shortness of breath and wheezing.   Cardiovascular: Negative.  Negative for chest pain, palpitations and leg swelling.  Gastrointestinal: Negative.  Negative for abdominal pain, blood in stool, diarrhea, nausea and vomiting.  Genitourinary: Negative.  Negative for dysuria.  Musculoskeletal: Positive for back pain (better) and falls.  Skin: Negative.   Neurological: Negative for dizziness, tremors, speech change, focal weakness, seizures and headaches.  Endo/Heme/Allergies: Negative.  Does not bruise/bleed easily.  Psychiatric/Behavioral: Positive for memory loss. Negative for depression, hallucinations and suicidal ideas.     PHYSICAL EXAMINATION:  GENERAL:  85 y.o.-year-old patient lying in the bed with no acute distress.  NECK:  Supple, no jugular venous distention. No thyroid enlargement, no tenderness.  LUNGS: Normal breath sounds bilaterally, no wheezing, rales,rhonchi  No use of accessory muscles of respiration.  CARDIOVASCULAR: S1, S2 normal. No murmurs, rubs, or gallops.  ABDOMEN: Soft, non-tender, non-distended. Bowel sounds present. No organomegaly or mass.  EXTREMITIES: No pedal edema, cyanosis, or clubbing.  PSYCHIATRIC: The patient is  alert and oriented x name SKIN: bilLateral LE tawny skin color  DATA REVIEW:   CBC Recent Labs  Lab 11/26/18 0521  WBC 9.3  HGB 11.7*  HCT 37.6  PLT 152    Chemistries  Recent Labs  Lab 11/27/18 0610  NA 139  K 4.3  CL 98  CO2 34*  GLUCOSE 103*  BUN 11  CREATININE 0.43*  CALCIUM 8.5*    Cardiac Enzymes No results for input(s): TROPONINI in the last 168 hours.  Microbiology Results  @MICRORSLT48 @  RADIOLOGY:  Dg Thoracic Spine 2 View  Result Date: 11/27/2018 CLINICAL DATA:  T11 kyphoplasty. EXAM: THORACIC SPINE 2 VIEWS COMPARISON:  MRI of the lumbosacral spine 11/26/2018 FINDINGS: Fluoroscopic images from vertebroplasty of T11 demonstrate placement of radiodense material with improvement of the height of the vertebral body. Fluoroscopy time is recorded as 1 minutes 29 seconds. IMPRESSION: Post  T11 vertebroplasty. Electronically Signed   By: Fidela Salisbury M.D.   On: 11/27/2018 15:26   Dg C-arm 1-60 Min  Result Date: 11/27/2018 CLINICAL DATA:  T11 kyphoplasty. EXAM: DG C-ARM 61-120 MIN COMPARISON:  MRI of the lumbosacral spine 11/26/2018 FINDINGS: Fluoroscopic images from T11 vertebroplasty demonstrate placement of radiodense material within the vertebral body with improved vertebral body height. Fluoroscopy time is recorded as 1 minutes 29 seconds. IMPRESSION: Intraoperative fluoroscopic images from T11 vertebroplasty. Electronically Signed   By: Fidela Salisbury M.D.   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   aspirin EC 81 MG tablet Take 81 mg by mouth  daily.   carvedilol 3.125 MG tablet Commonly known as: Coreg Take 1 tablet (3.125 mg total) by mouth 2 (two) times daily.   cloNIDine 0.1 MG tablet Commonly known as: CATAPRES Take 0.1 mg by mouth 2 (two) times daily.   divalproex 250 MG DR tablet Commonly known as: DEPAKOTE Take 250 mg by mouth 2 (two) times a day.   furosemide 40 MG tablet Commonly known as: LASIX Take 40 mg by mouth daily.   hydrALAZINE 100 MG tablet Commonly known as: APRESOLINE Take 1 tablet (100 mg total) by mouth 2 (two) times daily.   lidocaine 5 % Commonly known as: LIDODERM Place 1 patch onto the skin daily. Remove & Discard patch within 12 hours or as directed by MD   multivitamin with minerals Tabs tablet Take 1 tablet by mouth daily.   potassium chloride 10 MEQ CR capsule Commonly known as: MICRO-K Take 10 mEq by mouth 2 (two) times a day.   simvastatin 10 MG tablet Commonly known as: ZOCOR Take 10 mg by mouth at bedtime.   traMADol 50 MG tablet Commonly known as: ULTRAM Take 1 tablet (50 mg total) by mouth every 6 (six) hours.            Durable Medical Equipment  (From admission, onward)         Start     Ordered   11/28/18 0940  For home use only DME Gilford Rile  [facility])  Once    Question:  Patient needs a walker to treat with the following condition  Answer:  Weakness   11/28/18 0940   11/28/18 0940  DME Oxygen  Once    Question Answer Comment  Length of Need Lifetime   Mode or (Route) Nasal cannula   Liters per Minute 2   Frequency Continuous (stationary and portable oxygen unit needed)   Oxygen conserving device Yes   Oxygen delivery system Gas      11/28/18 0940             Management plans discussed with the patient's son and he is in agreement. Stable for discharge   Patient should follow up with ortho  CODE STATUS:     Code Status Orders  (From admission, onward)         Start     Ordered   11/25/18 2310  Full code  Continuous     11/25/18 2310         Code Status History    Date Active Date Inactive Code Status Order ID Comments User Context   05/30/2018 2032 06/01/2018 2020 Full Code [id number]  Saundra Shelling, MD Inpatient   02/29/2016 1107 03/02/2016 1707 Full Code [id number]  Dustin Flock, MD ED   Advance Care Planning Activity      TOTAL TIME TAKING CARE OF THIS PATIENT: 38 minutes.    Note: This dictation was prepared with Dragon dictation along with smaller phrase technology. Any transcriptional errors that result from this process are unintentional.  Bettey Costa M.D on 11/29/2018 at 9:00 AM  Between 7am to 6pm - Pager - [phone] After 6pm go to [email or web address] - password [email or web address] [facility]  Office  [phone]  CC: Primary care physician; Baxter Hire, MD

## 2019-04-23 DEATH — deceased

## 2020-06-18 IMAGING — CT CT HEAD W/O CM
3 series · 15 of 45 positions shown, 18 images · non-contrast
Comparison: 12/26/2013

CLINICAL DATA: Headache intermittently for several weeks. Dementia.

EXAM:
CT HEAD WITHOUT CONTRAST
TECHNIQUE: Contiguous axial images were obtained from the base of the skull
through the vertex without intravenous contrast.

[Series 4: head wo · axial · 0.41mm/px · z∈[+398,+513]mm · 9 of 28 slices shown, 12 images]
[im 3/28  brain]
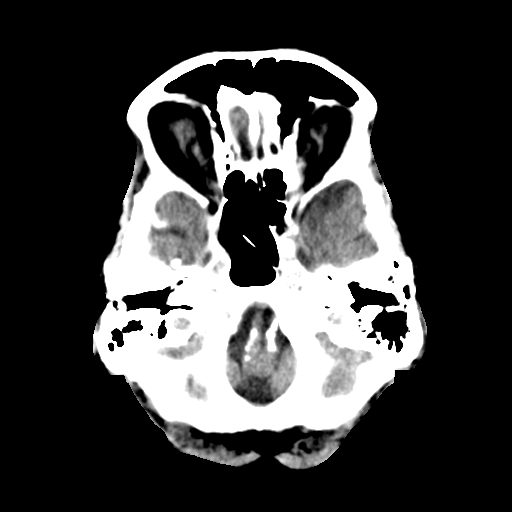
[im 3/28  bone]
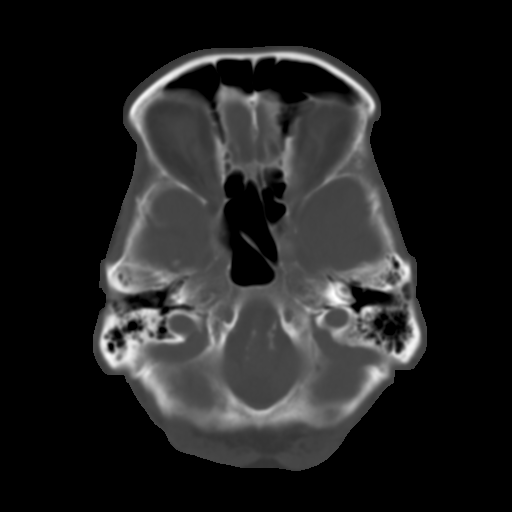
[im 6/28  brain]
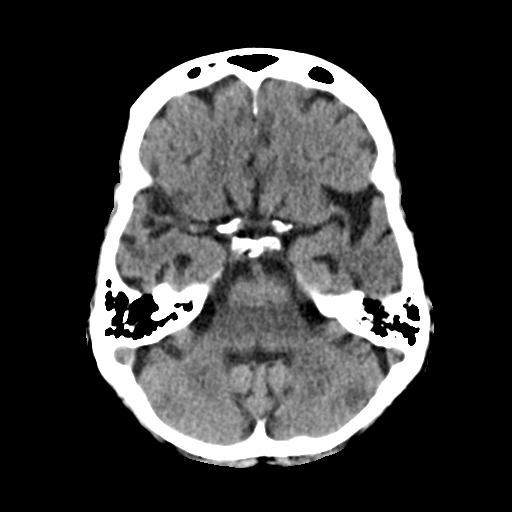
[im 9/28  brain]
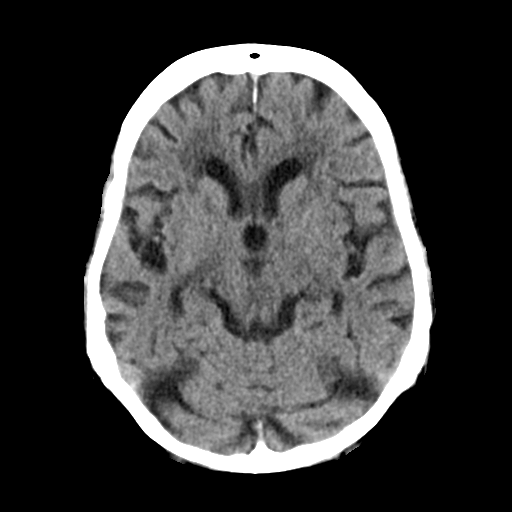
[im 12/28  brain]
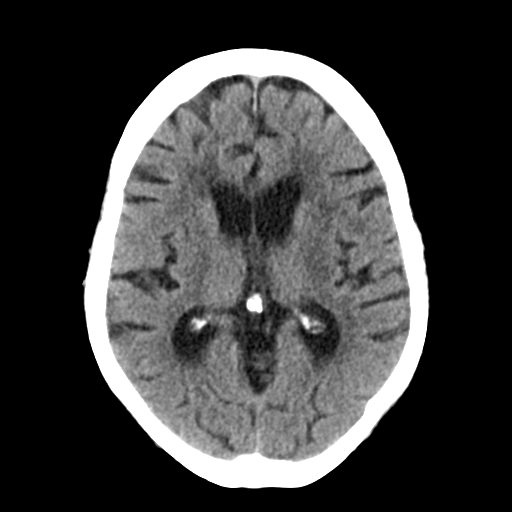
[im 15/28  brain]
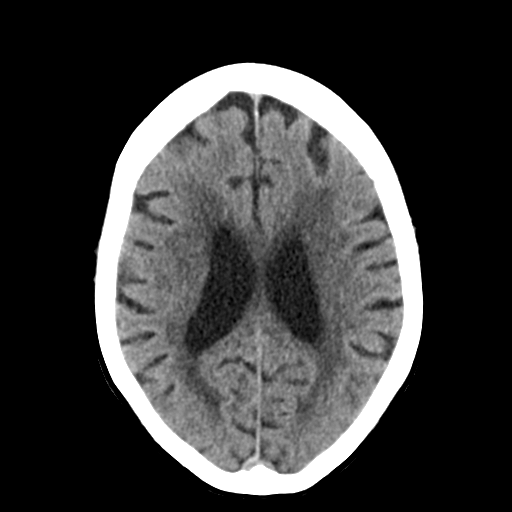
[im 15/28  bone]
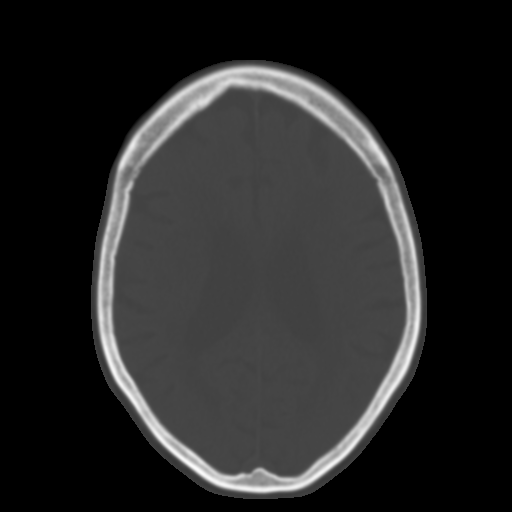
[im 17/28  brain]
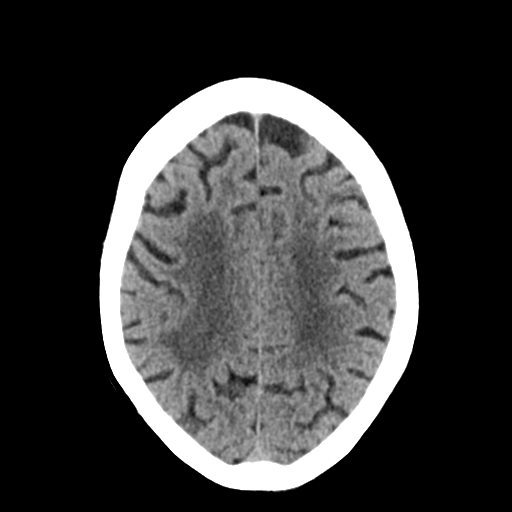
[im 20/28  brain]
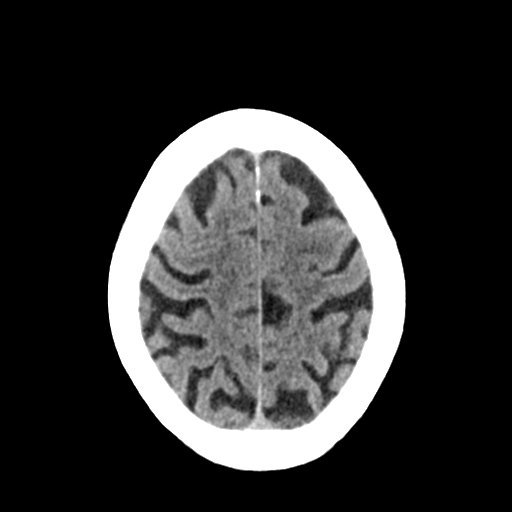
[im 23/28  brain]
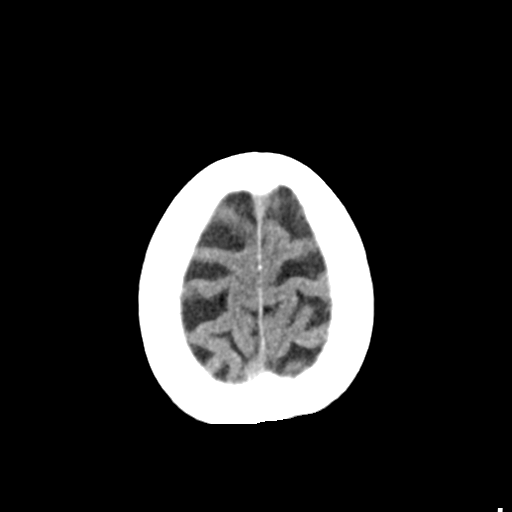
[im 26/28  brain]
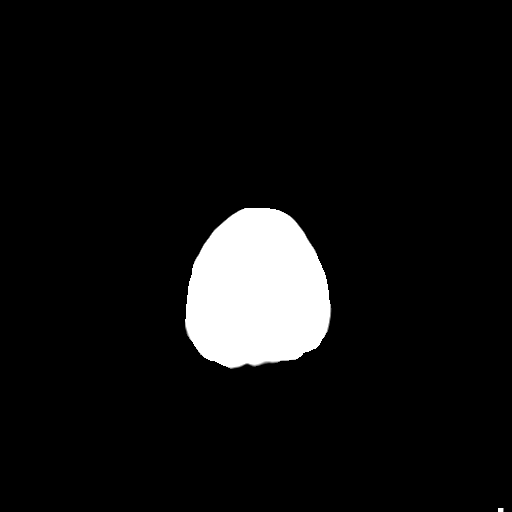
[im 26/28  bone]
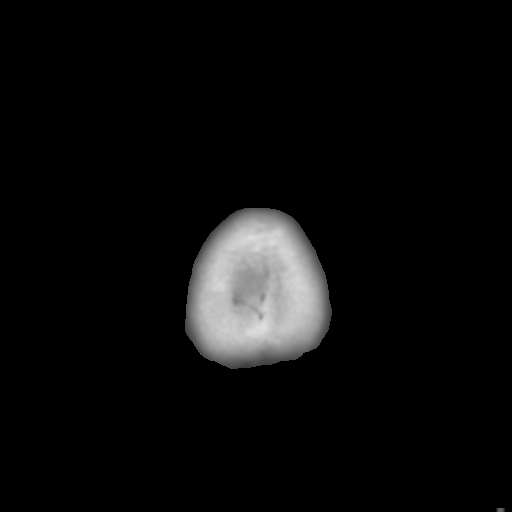

[Series 5: coronal soft tissue · coronal · 0.28mm/px · 3 of 64 slices shown]
[im 22/64  brain]
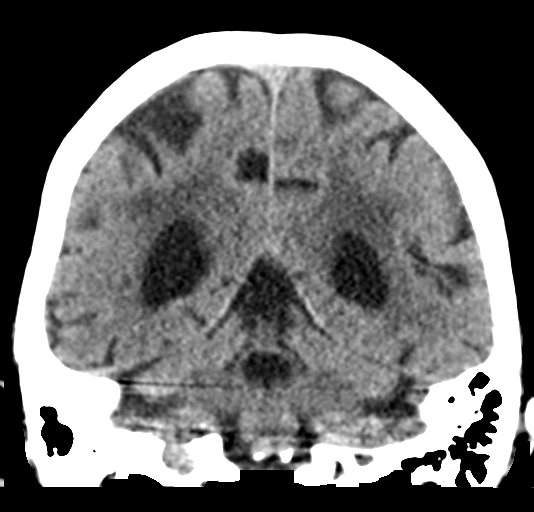
[im 29/64  brain]
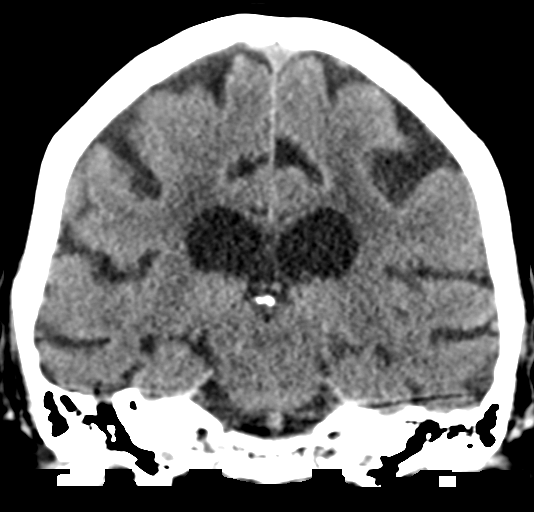
[im 36/64  brain]
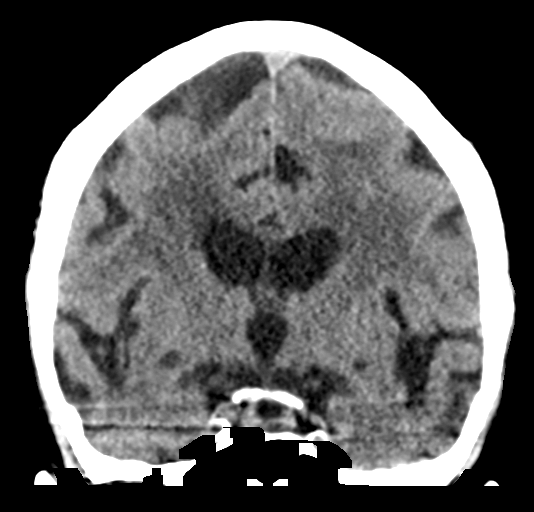

[Series 6: sagittal soft tissue · sagittal · 0.28mm/px · 3 of 50 slices shown]
[im 17/50  brain]
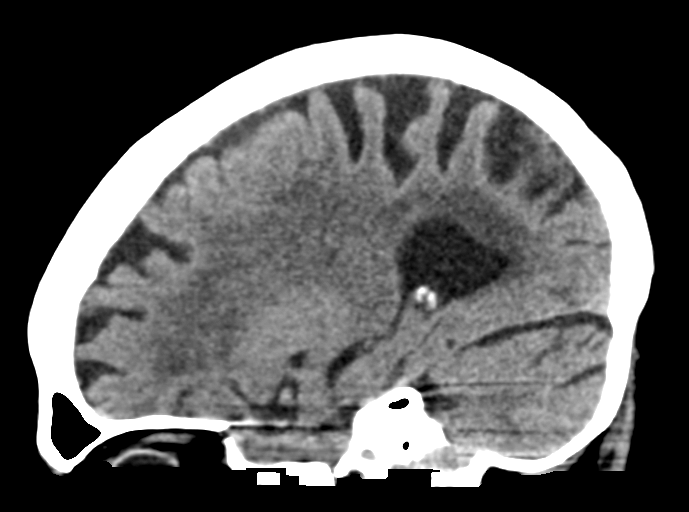
[im 25/50  brain]
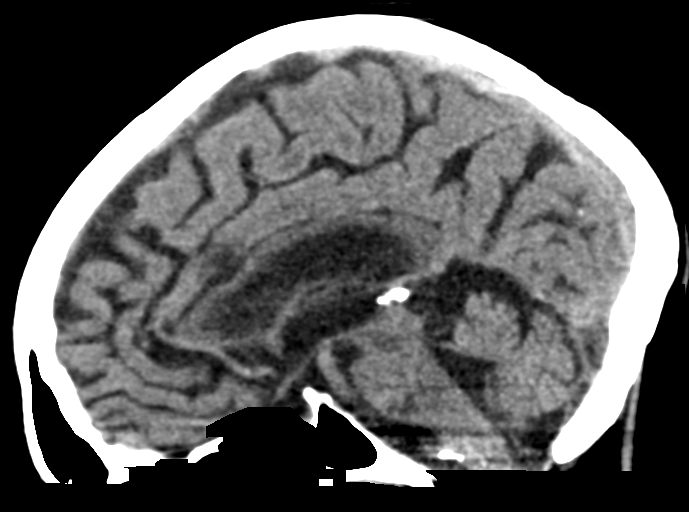
[im 33/50  brain]
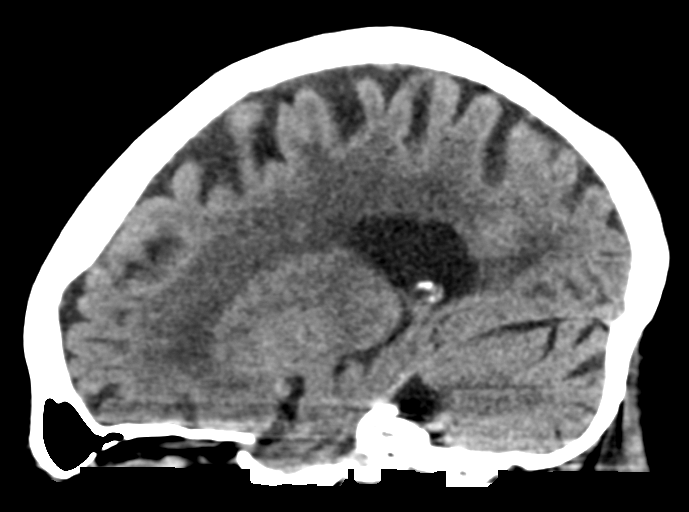

[15 of 45 positions shown; findings below may reference images not displayed]

FINDINGS: BRAIN: There is sulcal and ventricular prominence consistent with
superficial and central atrophy. No intraparenchymal hemorrhage,
mass effect nor midline shift. Periventricular and subcortical white
matter hypodensities consistent with chronic moderate small vessel
ischemic disease are redemonstrated. No acute large vascular
territory infarcts. No abnormal extra-axial fluid collections. Basal
cisterns are not effaced and midline.

VASCULAR: Moderate calcific atherosclerosis of the carotid siphons.

SKULL: No skull fracture. No significant scalp soft tissue swelling.
Osteoarthritis of the left TMJ with joint space narrowing and
spurring.

SINUSES/ORBITS: The mastoid air-cells are clear. The included
paranasal sinuses are well-aerated.The included ocular globes and
orbital contents are non-suspicious.

OTHER: None.
IMPRESSION: Atrophy with chronic appearing small vessel ischemic disease. No
acute intracranial abnormality.

## 2020-06-20 IMAGING — DX DG ABDOMEN 1V
1 series · 1 of 1 positions shown · non-contrast
Comparison: None.

CLINICAL DATA: Abdominal pain

EXAM:
ABDOMEN - 1 VIEW

[abdomen kub]
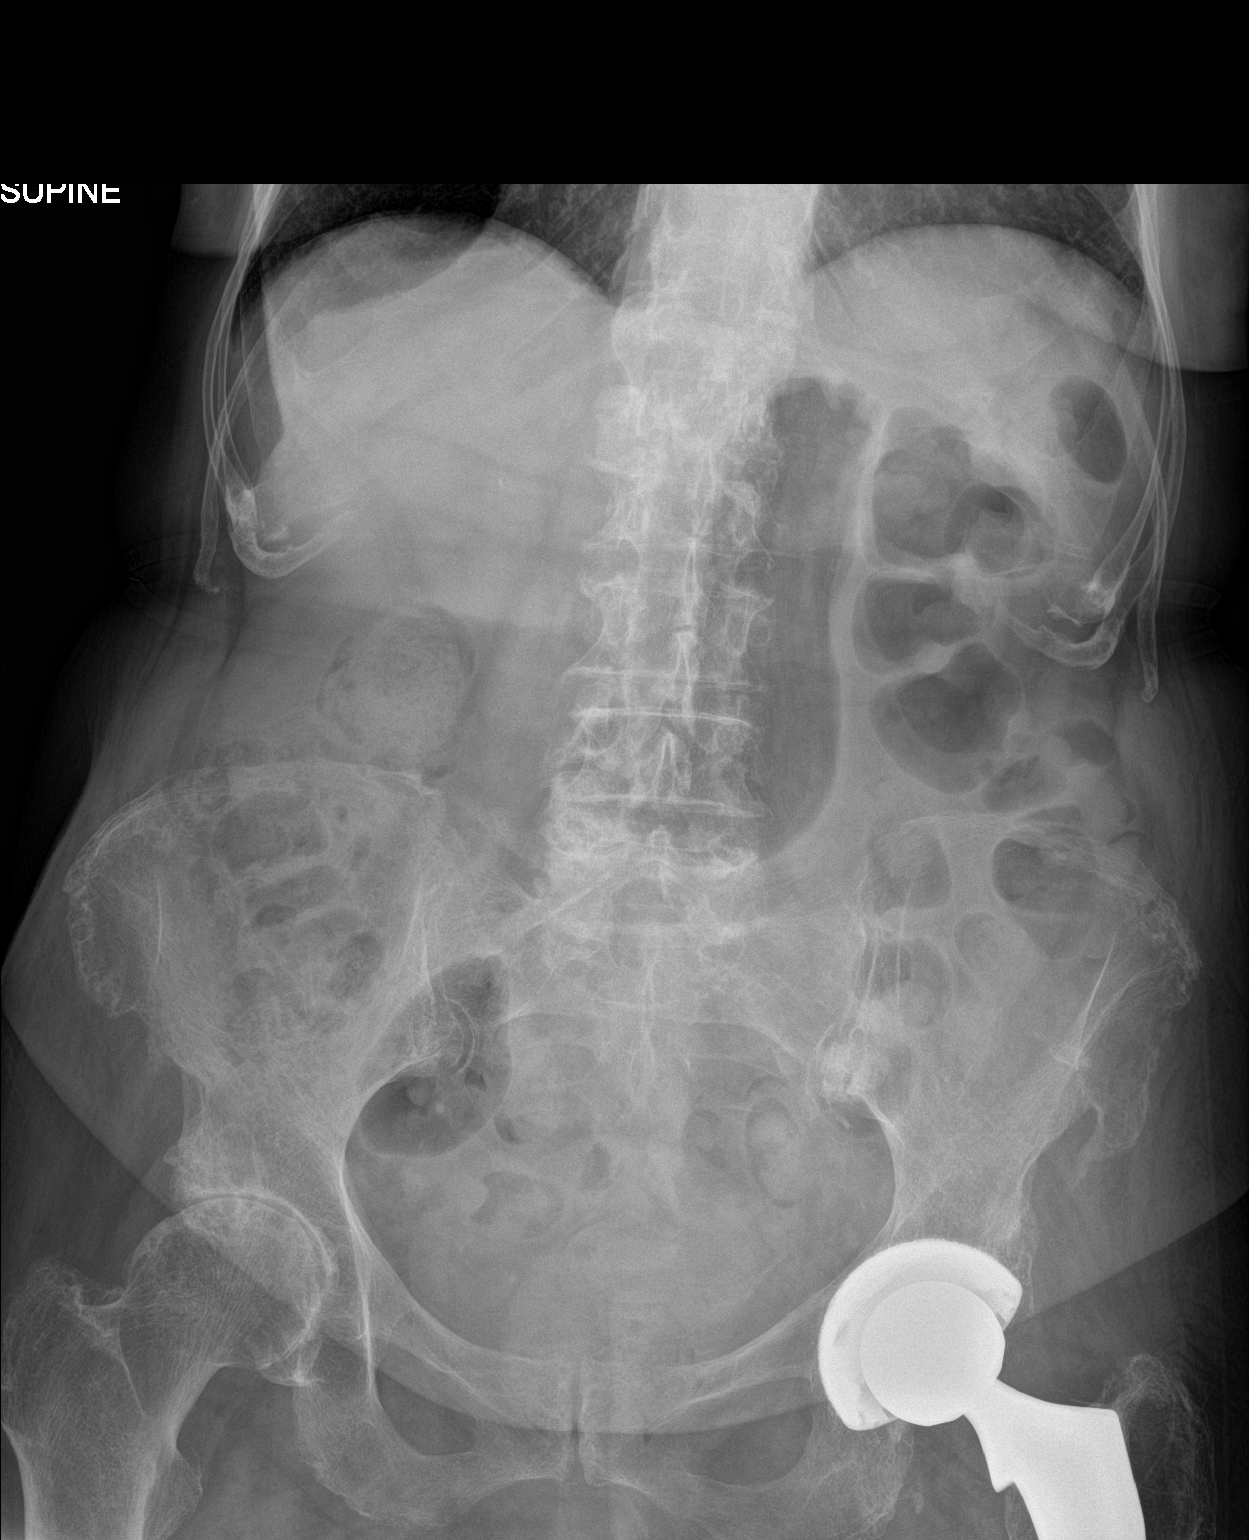

[1 of 1 positions shown; findings below may reference images not displayed]

FINDINGS: Lung bases are clear. Nonobstructed gas pattern with mild to
moderate stool. Phleboliths in the right pelvis. Status post left
hip replacement. Marked arthritis of the right hip.
IMPRESSION: Nonobstructed bowel-gas pattern
